# Patient Record
Sex: Male | Born: 1940
Health system: Southern US, Community
[De-identification: ages and names within clinical notes are randomized; demographics above are authoritative.]

## PROBLEM LIST (undated history)

## (undated) DIAGNOSIS — I5022 Chronic systolic (congestive) heart failure: Secondary | ICD-10-CM

## (undated) DIAGNOSIS — I2510 Atherosclerotic heart disease of native coronary artery without angina pectoris: Secondary | ICD-10-CM

## (undated) DIAGNOSIS — K219 Gastro-esophageal reflux disease without esophagitis: Secondary | ICD-10-CM

## (undated) DIAGNOSIS — I10 Essential (primary) hypertension: Secondary | ICD-10-CM

## (undated) DIAGNOSIS — E785 Hyperlipidemia, unspecified: Secondary | ICD-10-CM

## (undated) DIAGNOSIS — I420 Dilated cardiomyopathy: Secondary | ICD-10-CM

## (undated) DIAGNOSIS — Z950 Presence of cardiac pacemaker: Secondary | ICD-10-CM

## (undated) DIAGNOSIS — I2699 Other pulmonary embolism without acute cor pulmonale: Secondary | ICD-10-CM

## (undated) DIAGNOSIS — L03313 Cellulitis of chest wall: Secondary | ICD-10-CM

## (undated) DIAGNOSIS — I4729 Other ventricular tachycardia: Secondary | ICD-10-CM

## (undated) DIAGNOSIS — I639 Cerebral infarction, unspecified: Secondary | ICD-10-CM

## (undated) DIAGNOSIS — E538 Deficiency of other specified B group vitamins: Secondary | ICD-10-CM

## (undated) DIAGNOSIS — F329 Major depressive disorder, single episode, unspecified: Secondary | ICD-10-CM

## (undated) DIAGNOSIS — R040 Epistaxis: Secondary | ICD-10-CM

## (undated) DIAGNOSIS — I509 Heart failure, unspecified: Secondary | ICD-10-CM

## (undated) DIAGNOSIS — E039 Hypothyroidism, unspecified: Secondary | ICD-10-CM

## (undated) DIAGNOSIS — N183 Chronic kidney disease, stage 3 unspecified: Secondary | ICD-10-CM

## (undated) DIAGNOSIS — F32A Depression, unspecified: Secondary | ICD-10-CM

## (undated) DIAGNOSIS — I340 Nonrheumatic mitral (valve) insufficiency: Secondary | ICD-10-CM

## (undated) DIAGNOSIS — C61 Malignant neoplasm of prostate: Secondary | ICD-10-CM

## (undated) DIAGNOSIS — Z95810 Presence of automatic (implantable) cardiac defibrillator: Secondary | ICD-10-CM

## (undated) DIAGNOSIS — I739 Peripheral vascular disease, unspecified: Secondary | ICD-10-CM

## (undated) DIAGNOSIS — I480 Paroxysmal atrial fibrillation: Secondary | ICD-10-CM

## (undated) DIAGNOSIS — H409 Unspecified glaucoma: Secondary | ICD-10-CM

## (undated) DIAGNOSIS — R569 Unspecified convulsions: Secondary | ICD-10-CM

## (undated) DIAGNOSIS — I219 Acute myocardial infarction, unspecified: Secondary | ICD-10-CM

## (undated) HISTORY — PX: HERNIA REPAIR: SHX51

## (undated) HISTORY — PX: CORONARY ANGIOPLASTY: SHX604

## (undated) HISTORY — PX: BRAIN SURGERY: SHX531

## (undated) HISTORY — PX: CRANIOPLASTY: SUR330

## (undated) HISTORY — PX: OTHER SURGICAL HISTORY: SHX169

## (undated) HISTORY — PX: CARDIAC CATHETERIZATION: SHX172

## (undated) HISTORY — PX: INSERT / REPLACE / REMOVE PACEMAKER: SUR710

## (undated) HISTORY — PX: CARDIAC DEFIBRILLATOR PLACEMENT: SHX171

## (undated) HISTORY — PX: CORONARY ARTERY BYPASS GRAFT: SHX141

## (undated) HISTORY — PX: JOINT REPLACEMENT: SHX530

---
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
## 2007-03-29 HISTORY — PX: JOINT REPLACEMENT: SHX530

## 2008-01-21 ENCOUNTER — Ambulatory Visit: Payer: Self-pay | Admitting: General Practice

## 2008-02-04 ENCOUNTER — Inpatient Hospital Stay: Payer: Self-pay | Admitting: General Practice

## 2011-03-02 ENCOUNTER — Ambulatory Visit: Payer: Self-pay | Admitting: Cardiology

## 2011-04-21 ENCOUNTER — Observation Stay: Payer: Self-pay | Admitting: Cardiology

## 2011-04-21 LAB — CBC WITH DIFFERENTIAL/PLATELET
Basophil %: 0.5 %
Eosinophil %: 2.3 %
HGB: 14.7 g/dL (ref 13.0–18.0)
Lymphocyte %: 32.4 %
MCH: 34.1 pg — ABNORMAL HIGH (ref 26.0–34.0)
Monocyte #: 0.5 10*3/uL (ref 0.0–0.7)
Monocyte %: 9.6 %
Neutrophil %: 55.2 %
RBC: 4.31 10*6/uL — ABNORMAL LOW (ref 4.40–5.90)
RDW: 13.1 % (ref 11.5–14.5)
WBC: 5.2 10*3/uL (ref 3.8–10.6)

## 2011-04-21 LAB — BASIC METABOLIC PANEL
BUN: 17 mg/dL (ref 7–18)
Creatinine: 1.24 mg/dL (ref 0.60–1.30)
EGFR (Non-African Amer.): >60
Glucose: 90 mg/dL (ref 65–99)
Osmolality: 286 (ref 275–301)
Potassium: 4.6 mmol/L (ref 3.5–5.1)
Sodium: 143 mmol/L (ref 136–145)

## 2012-07-12 ENCOUNTER — Inpatient Hospital Stay: Payer: Self-pay

## 2012-07-12 ENCOUNTER — Other Ambulatory Visit: Payer: Self-pay

## 2012-07-12 LAB — URINALYSIS, COMPLETE
Bacteria: NONE SEEN
Bilirubin,UR: NEGATIVE
Blood: NEGATIVE
Ketone: NEGATIVE
Leukocyte Esterase: NEGATIVE
Nitrite: NEGATIVE
RBC,UR: 1 /HPF (ref 0–5)
Specific Gravity: 1.021 (ref 1.003–1.030)

## 2012-07-12 LAB — CK TOTAL AND CKMB (NOT AT ARMC)
CK, Total: 199 U/L (ref 35–232)
CK-MB: 1.6 ng/mL (ref 0.5–3.6)

## 2012-07-12 LAB — SEDIMENTATION RATE: Erythrocyte Sed Rate: 29 mm/hr — ABNORMAL HIGH (ref 0–20)

## 2012-07-12 LAB — PRO B NATRIURETIC PEPTIDE: B-Type Natriuretic Peptide: 362 pg/mL — ABNORMAL HIGH (ref 0–125)

## 2012-07-12 LAB — TROPONIN I: Troponin-I: <0.02 ng/mL

## 2012-07-13 LAB — CBC WITH DIFFERENTIAL/PLATELET
Basophil #: 0.1 10*3/uL (ref 0.0–0.1)
Eosinophil #: 0 10*3/uL (ref 0.0–0.7)
Lymphocyte #: 1.5 10*3/uL (ref 1.0–3.6)
MCH: 33.1 pg (ref 26.0–34.0)
MCHC: 34.5 g/dL (ref 32.0–36.0)
MCV: 96 fL (ref 80–100)
Monocyte #: 0.5 x10 3/mm (ref 0.2–1.0)
Monocyte %: 13.3 %
Neutrophil %: 49.1 %
Platelet: 144 10*3/uL — ABNORMAL LOW (ref 150–440)
RDW: 14.2 % (ref 11.5–14.5)
WBC: 4.1 10*3/uL (ref 3.8–10.6)

## 2012-07-13 LAB — BASIC METABOLIC PANEL
Anion Gap: 8 (ref 7–16)
Chloride: 103 mmol/L (ref 98–107)
Co2: 24 mmol/L (ref 21–32)
Creatinine: 1.72 mg/dL — ABNORMAL HIGH (ref 0.60–1.30)
EGFR (Non-African Amer.): 40 — ABNORMAL LOW
Potassium: 4 mmol/L (ref 3.5–5.1)

## 2012-07-13 LAB — TROPONIN I: Troponin-I: <0.02 ng/mL

## 2012-07-14 LAB — BASIC METABOLIC PANEL
Anion Gap: 5 — ABNORMAL LOW (ref 7–16)
BUN: 21 mg/dL — ABNORMAL HIGH (ref 7–18)
Calcium, Total: 8.5 mg/dL (ref 8.5–10.1)
Creatinine: 1.63 mg/dL — ABNORMAL HIGH (ref 0.60–1.30)
EGFR (African American): 49 — ABNORMAL LOW
EGFR (Non-African Amer.): 42 — ABNORMAL LOW
Glucose: 82 mg/dL (ref 65–99)
Osmolality: 276 (ref 275–301)
Potassium: 4.6 mmol/L (ref 3.5–5.1)
Sodium: 137 mmol/L (ref 136–145)

## 2012-07-18 LAB — CULTURE, BLOOD (SINGLE)

## 2014-07-18 NOTE — Consult Note (Signed)
PATIENT NAME:  Jerry English, Jerry English MR#:  [id number] DATE OF BIRTH:  October 15, 1940  DATE OF CONSULTATION:  07/13/2012  REFERRING PHYSICIAN:   Dr. Bridgette Habermann and Dr. Ola Spurr.   CONSULTING PHYSICIAN:  Corey Skains, MD  REASON FOR CONSULTATION:  Known cardiomyopathy, ejection fraction of 25% with ICD placement, coronary artery disease, chronic kidney disease, hypertension and cardiomyopathy with currently hypotension, weakness and fatigue. The patient was admitted to the hospital for severe weakness and fatigue and dizziness and presyncope. He did not have full syncope at the time. He is having shortness of breath with physical activity, although chest x-ray showed no evidence of pulmonary edema. The patient does have cardiomyopathy for which he has an ejection fraction of 25% with an ICD with recent ICD information showing no evidence of ventricular tachycardia or ventricular fibrillation.  The patient has had coronary artery disease with PCI and stent placement in the right coronary artery in 1999, but no evidence of chest pain recently. He does have some chronic kidney disease not associated with symptoms listed above. The patient's hypertension was fine on multiple medications, but it appears that with changes, he has had some hypotension with blood pressure in the 24O systolic range. Currently, he feels somewhat better with treatment with intravenous fluids. Blood pressure is up to 973 systolic.   REVIEW OF SYSTEMS:  Negative for vision change, ringing in the ears, hearing loss, cough, congestion, heartburn, nausea, vomiting, diarrhea, bloody stools, stomach pain, extremity weakness, cramping of the buttocks, known blood clots, headaches, blackouts, dizzy spells, nosebleed, congestion, trouble swallowing, frequent urination at night, muscle weakness, numbness, anxiety, depression, skin lesions or skin rashes.   PAST MEDICAL HISTORY: 1.  Cardiomyopathy.  2.  Hypertension.  3.  Hyperlipidemia.  4.   Chronic kidney disease.   FAMILY HISTORY: No family members with early onset of cardiovascular disease or hypertension.   SOCIAL HISTORY:  Currently denies alcohol or tobacco use.   ALLERGIES:  As listed.  MEDICATIONS:  As listed.  PHYSICAL EXAMINATION: VITAL SIGNS: Blood pressure 110/68 bilaterally, heart rate 72 upright, reclining and regular.  GENERAL:  He is a well-appearing male in no acute distress.  HEAD, EYES, EARS, NOSE, THROAT: No icterus, thyromegaly, ulcers, hemorrhages or xanthelasma.  CARDIOVASCULAR:  Regular rate and rhythm. Normal S1 and S2, 2/6 apical murmur consistent with mitral regurgitation. PMI is inferiorly displaced. Carotid upstroke normal without bruit. Jugular venous pressure is normal.  LUNGS:   Clear to auscultation with normal respirations.  ABDOMEN:  Soft, nontender without hepatosplenomegaly or masses. Abdominal aorta is normal size without bruit.  EXTREMITIES:  Show 2+ bilateral pulses in dorsal, pedal, radial and femoral arteries without lower extremity edema, cyanosis, clubbing or ulcers.  NEUROLOGIC:  He is oriented to time, place and person with normal mood and affect.   ASSESSMENT: A 74 year old male with coronary artery disease, hypertension, hyperlipidemia, chronic kidney disease, cardiomyopathy with hypotension likely secondary to poor cardiac output versus need of medication management changes without current evidence of myocardial infarction with an EKG showing arteriovenous sequential pacing.   RECOMMENDATIONS: 1.  Continue gentle hydration.  2.  Reinstate beta blocker if able at low dose prior to discharge to home for continued treatment of cardiomyopathy.  3.  Abstain from ACE inhibitor at this time and reinstate as able for renal dysfunction and chronic kidney disease, watching closely for worsening symptoms of chronic kidney disease.  4.  Continue aspirin for further risk reduction of coronary artery disease.  5.  No further cardiac  diagnosis  is necessary at this time due to no evidence of exacerbation of congestive heart failure or cardiomyopathy.  6.  Further treatment options after ambulation.    ____________________________ Corey Skains, MD bjk:dmm D: 07/13/2012 09:32:00 ET T: 07/13/2012 09:57:39 ET JOB#: [id number]  cc: Corey Skains, MD, <Dictator> Corey Skains MD ELECTRONICALLY SIGNED 07/18/2012 8:12

## 2014-07-18 NOTE — Discharge Summary (Signed)
PATIENT NAME:  Jerry English, Jerry English MR#:  [id number] DATE OF BIRTH:  10-Apr-1940  DATE OF ADMISSION:  07/12/2012 DATE OF DISCHARGE: 07/14/2012    PRIMARY CARE PROVIDER: Cheral Marker. Ola Spurr, MD  CONSULTANT: Corey Skains, MD  DISCHARGE DIAGNOSES:  1. Acute sinusitis.  2. Congestive heart failure.  3. Coronary artery disease  HISTORY OF PRESENT ILLNESS: A 74 year old male with a history of coronary disease, hypertension, hyperlipidemia and cardiomyopathy (EF 25% to 30% in October 2013), presented with complaints of fevers, cough, weakness and shortness of breath. Symptoms had been ongoing for 5 days. Temperature was found to be greater than 100 deg for two days prior to admission, and he was started on antibiotics and guaifenesin. Due to ongoing symptoms as well as nausea and poor p.o. intake, he presented for further evaluation. Initial blood pressure was found to be in the 90s to 284 range systolic, he was afebrile, with normal O2 sat. Initial white count 4.7, creatinine elevated at 1.8 (baseline 1.5), and cardiac enzymes negative. Chest x-ray showed no acute findings.   HOSPITAL COURSE: The patient was admitted and placed on telemetry. Due to concerns of dehydration, he was initiated on gentle IV fluids. Due to hypotension, losartan/HCTZ, spironolactone and amiodarone were held. Cardiology was consulted, and he was evaluated by Dr. Nehemiah Massed. Dr. Nehemiah Massed concurred with holding his blood pressure agents and recommended restarting amiodarone. Augmentin was felt to possibly be contributing to nausea, so that was stopped, and he was changed to Ceftin. The patient remained afebrile. He was monitored, and blood pressure mildly improved off BP agents. He had no evidence of CHF exacerbation. For concerns of sinusitis, loratadine and Flonase were added. Plan is for close followup with PCP and his cardiologist, Dr. Ubaldo Glassing, in several days, with consideration of restarting BP agents if needed.   DISCHARGE  MEDICATIONS:  1. Amiodarone 200 mg daily.  2. Simvastatin 40 mg daily.  3. Omeprazole 20 mg daily.  4. Aspirin 81 mg 2 tabs daily.  5. Travatan eyedrops 1 gtt both eyes daily.  6. Timolol 0.5% eyedrops 1 gtt both eyes daily.  7. Cefuroxime 500 mg b.i.d. for an additional 8 days.  8. Fluticasone 2 sprays daily.  9. Loratadine 10 mg daily.   MEDICATIONS HELD AT DISCHARGE:  1. Losartan/HCTZ.  2. Spironolactone.   ____________________________ A. Lavone Orn, MD ams:OSi D: 07/14/2012 11:24:40 ET T: 07/14/2012 13:37:33 ET JOB#: [id number]  cc: A. Lavone Orn, MD, <Dictator> Cheral Marker. Ola Spurr, MD Javier Docker Ubaldo Glassing, MD Sherlon Handing MD ELECTRONICALLY SIGNED 07/15/2012 8:22

## 2014-07-18 NOTE — H&P (Signed)
PATIENT NAME:  Jerry English, Jerry English MR#:  [id number] DATE OF BIRTH:  [birth year]  DATE OF ADMISSION:  07/12/2012  PRIMARY CARE PHYSICIAN:  Dr. Adrian Prows.    PRIMARY CARDIOLOGIST:  Dr. Ubaldo Glassing.  PULMONOLOGIST:  Dr. Raul Del.   REASON FOR ADMISSION:  Hypotension and fever, nausea, vomiting and dizziness.   HISTORY OF PRESENT ILLNESS:  This is a pleasant 74 year old gentleman relatively well-functioning who has a history of coronary artery disease followed by Dr. Ubaldo Glassing as well as hypertension, hyperlipidemia and an idiopathic cardiomyopathy.  He had been doing relatively well until approximately 5 days ago when he developed some fevers, cough, weakness, shortness of breath and chest pain and pressure.  He was also very dizzy.  He went to the urgent care center two days ago, was noted to have a temperature greater than 100, was given Augmentin and guaifenesin.  No blood work was done at that time.  He continues to feel quite poor with continued fevers.  He also started to have some nausea, vomiting, has very poor intake.  He had chills last night.  Of note, he has had no diarrhea.  He does report that his breathing is worse when he is lying supine.   It is also reported that he had had some breast swelling and tenderness since he has been on the spironolactone for the last year.   PAST MEDICAL HISTORY: 1.  Coronary artery disease, status post PTCA of the proximal right coronary artery in 1999.  2.  Idiopathic cardiomyopathy.  Most recent EF of 25% to 30% on 01/17/2012.  He also has mild pulmonary hypertension.  3.  ICD biventricular pacer placed February of 2013.  4.  Hypertension.  5.  Hyperlipidemia.   PAST SURGICAL HISTORY:  Knee surgery in 1973, biventricular AICD placed in 2013, head injury in 1981.   SOCIAL HISTORY:  The patient is married and here with his wife.  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  He does not smoke and does not drink.   FAMILY HISTORY:   Positive for a brother with prostate cancer.  There is also a history of cardiac disease with his father dying of an MI at age 45.   REVIEW OF SYSTEMS:  Eleven systems reviewed and negative except as per history of present illness.   CURRENT MEDICATIONS:  Currently as an outpatient the patient remains on:  1.  Aspirin 81 once a day.  2.  Simvastatin 40 once a day.  3.  Losartan hydrochlorothiazide 100/12.5 once a day.  4.  Omeprazole 20 mg once a day.  5.  Spironolactone 25 mg once a day.  6.  Amiodarone 200 mg once a day.  He has been on this for about a year.  7.  Lansoprazole eye drops.  8.  Timoptic eye drops.   ALLERGIES:  No known drug allergies.   PHYSICAL EXAMINATION: VITAL SIGNS:  Weight 222, blood pressure 98/78 to 108/60, heart rate 66, temperature 99, pulse ox 96% on room air.  GENERAL:  He is ill-appearing, has a mottled appearance to skin with very poor capillary refill.  He has some mild confusion.  HEENT:  His pupils are equal, round, reactive to light and accommodation.  NECK:  Supple.   OROPHARYNX:  Mucous membranes are slightly dry.  No lymphadenopathy.  HEART:  Has very distant heart sounds.  LUNGS:  Clear to auscultation bilaterally.  ABDOMEN:  Soft, nontender, nondistended.  EXTREMITIES:  There is trace edema.  He has very cool extremities with poor perfusion and mottled appearance.  There is very poor cap refill.  NEUROLOGIC:  He is somewhat sluggish and slow, but he is alert, interactive.  Cranial nerves II through XII are intact and his strength is 5 out of 5 bilateral upper extremity, lower extremity.   DIAGNOSTIC DATA:  Blood work done today, white count 4.7, hemoglobin 14.0, platelets 172.  Comprehensive panel within normal limits except a creatinine of 1.8, which is slightly elevated from his baseline of about 1.5.  Potassium is 5.2.  Sodium is a little low at 133.  CK is 199 with MB 1.6, and troponin of 0.02.  Chest x-ray interpreted by me showed poor  inspiratory view.  He has some mild cardiomegaly.  ICD in place.  There is some mild cephalization, but no focal infiltrate.  Maybe some haziness at the left heart border.   IMPRESSION:  A 74 year old with coronary artery disease, known idiopathic cardiomyopathy, ejection fraction of about 25% with an illness over the last several weeks with fevers, progressive weakness and dizziness, shortness of breath, now some nausea, vomiting.  He is hypotensive and quite mottled and cool extremities.  Concern is for other infectious sepsis or possibly cardiogenic shock as he has quite distant heart sounds as well.   PLAN: 1.  We will admit to telemetry.  2.  We will give gentle IV fluids.  3.  We will check BNP.  We will also check thyroid testing as he is on amiodarone.  Check an ESR and blood cultures as well.  4.  We are going to hold his antihypertensives including the losartan, hydrochlorothiazide and spironolactone for now.  I am also going to hold his amiodarone.  5.  We will check an echocardiogram.  6.   We will consult cardiology for further evaluation.  7.  He has recently received Augmentin and that may be contributing to his nausea, vomiting.  In the absence of an elevated white count I am going to hold on antibiotic therapy at this time.     ____________________________ Cheral Marker. Ola Spurr, MD dpf:ea D: 07/12/2012 17:31:49 ET T: 07/12/2012 18:22:41 ET JOB#: [id number]  cc: Cheral Marker. Ola Spurr, MD, <Dictator> Korban Shearer Ola Spurr MD ELECTRONICALLY SIGNED 07/25/2012 12:08

## 2014-08-09 ENCOUNTER — Other Ambulatory Visit: Payer: Self-pay

## 2014-08-09 ENCOUNTER — Encounter: Payer: Self-pay | Admitting: Emergency Medicine

## 2014-08-09 ENCOUNTER — Emergency Department
Admission: EM | Admit: 2014-08-09 | Discharge: 2014-08-09 | Disposition: A | Discharge status: Home / Self Care | Payer: Medicare Other | Attending: Emergency Medicine | Admitting: Emergency Medicine

## 2014-08-09 ENCOUNTER — Emergency Department: Payer: Medicare Other

## 2014-08-09 DIAGNOSIS — Z7982 Long term (current) use of aspirin: Secondary | ICD-10-CM | POA: Diagnosis not present

## 2014-08-09 DIAGNOSIS — S0990XA Unspecified injury of head, initial encounter: Secondary | ICD-10-CM | POA: Diagnosis not present

## 2014-08-09 DIAGNOSIS — Z79899 Other long term (current) drug therapy: Secondary | ICD-10-CM | POA: Insufficient documentation

## 2014-08-09 DIAGNOSIS — Y9389 Activity, other specified: Secondary | ICD-10-CM | POA: Insufficient documentation

## 2014-08-09 DIAGNOSIS — Y9289 Other specified places as the place of occurrence of the external cause: Secondary | ICD-10-CM | POA: Diagnosis not present

## 2014-08-09 DIAGNOSIS — Y998 Other external cause status: Secondary | ICD-10-CM | POA: Diagnosis not present

## 2014-08-09 DIAGNOSIS — S20211A Contusion of right front wall of thorax, initial encounter: Secondary | ICD-10-CM | POA: Diagnosis not present

## 2014-08-09 DIAGNOSIS — W01198A Fall on same level from slipping, tripping and stumbling with subsequent striking against other object, initial encounter: Secondary | ICD-10-CM | POA: Diagnosis not present

## 2014-08-09 DIAGNOSIS — I10 Essential (primary) hypertension: Secondary | ICD-10-CM | POA: Diagnosis not present

## 2014-08-09 DIAGNOSIS — S8991XA Unspecified injury of right lower leg, initial encounter: Secondary | ICD-10-CM | POA: Insufficient documentation

## 2014-08-09 HISTORY — DX: Essential (primary) hypertension: I10

## 2014-08-09 HISTORY — DX: Presence of automatic (implantable) cardiac defibrillator: Z95.810

## 2014-08-09 HISTORY — DX: Unspecified glaucoma: H40.9

## 2014-08-09 MED ORDER — HYDROCODONE-ACETAMINOPHEN 5-325 MG PO TABS
1.0000 | ORAL_TABLET | Freq: Four times a day (QID) | ORAL | Status: DC | PRN
Start: 2014-08-09 — End: 2015-02-26

## 2014-08-09 MED ORDER — MORPHINE SULFATE 4 MG/ML IJ SOLN
INTRAMUSCULAR | Status: AC
  Administered 2014-08-09: 4 mg via INTRAVENOUS
  Filled 2014-08-09: qty 1

## 2014-08-09 MED ORDER — MORPHINE SULFATE 4 MG/ML IJ SOLN
4.0000 mg | Freq: Once | INTRAMUSCULAR | Status: AC
  Administered 2014-08-09: 4 mg via INTRAVENOUS

## 2014-08-09 MED ORDER — MORPHINE SULFATE 4 MG/ML IJ SOLN
INTRAMUSCULAR | Status: AC
  Filled 2014-08-09: qty 1

## 2014-08-09 NOTE — Discharge Instructions (Signed)
Chest Contusion A chest contusion is a deep bruise on your chest area. Contusions are the result of an injury that caused bleeding under the skin. A chest contusion may involve bruising of the skin, muscles, or ribs. The contusion may turn blue, purple, or yellow. Minor injuries will give you a painless contusion, but more severe contusions may stay painful and swollen for a few weeks. CAUSES  A contusion is usually caused by a blow, trauma, or direct force to an area of the body. SYMPTOMS   Swelling and redness of the injured area.  Discoloration of the injured area.  Tenderness and soreness of the injured area.  Pain. DIAGNOSIS  The diagnosis can be made by taking a history and performing a physical exam. An X-ray, CT scan, or MRI may be needed to determine if there were any associated injuries, such as broken bones (fractures) or internal injuries. TREATMENT  Often, the best treatment for a chest contusion is resting, icing, and applying cold compresses to the injured area. Deep breathing exercises may be recommended to reduce the risk of pneumonia. Over-the-counter medicines may also be recommended for pain control. HOME CARE INSTRUCTIONS   Put ice on the injured area.  Put ice in a plastic bag.  Place a towel between your skin and the bag.  Leave the ice on for 15-20 minutes, 03-04 times a day.  Only take over-the-counter or prescription medicines as directed by your caregiver. Your caregiver may recommend avoiding anti-inflammatory medicines (aspirin, ibuprofen, and naproxen) for 48 hours because these medicines may increase bruising.  Rest the injured area.  Perform deep-breathing exercises as directed by your caregiver.  Stop smoking if you smoke.  Do not lift objects over 5 pounds (2.3 kg) for 3 days or longer if recommended by your caregiver. SEEK IMMEDIATE MEDICAL CARE IF:   You have increased bruising or swelling.  You have pain that is getting worse.  You have  difficulty breathing.  You have dizziness, weakness, or fainting.  You have blood in your urine or stool.  You cough up or vomit blood.  Your swelling or pain is not relieved with medicines. MAKE SURE YOU:   Understand these instructions.  Will watch your condition.  Will get help right away if you are not doing well or get worse. Document Released: 12/07/2000 Document Revised: 12/07/2011 Document Reviewed: 09/05/2011 [facility] Patient Information 2015 [place], [place]. This information is not intended to replace advice given to you by your health care provider. Make sure you discuss any questions you have with your health care provider.   Head Injury You have a head injury. Headaches and throwing up (vomiting) are common after a head injury. It should be easy to wake up from sleeping. Sometimes you must stay in the hospital. Most problems happen within the first 24 hours. Side effects may occur up to 7-10 days after the injury.  WHAT ARE THE TYPES OF HEAD INJURIES? Head injuries can be as minor as a bump. Some head injuries can be more severe. More severe head injuries include:  A jarring injury to the brain (concussion).  A bruise of the brain (contusion). This mean there is bleeding in the brain that can cause swelling.  A cracked skull (skull fracture).  Bleeding in the brain that collects, clots, and forms a bump (hematoma). WHEN SHOULD I GET HELP RIGHT AWAY?   You are confused or sleepy.  You cannot be woken up.  You feel sick to your stomach (nauseous) or keep  throwing up (vomiting).  Your dizziness or unsteadiness is getting worse.  You have very bad, lasting headaches that are not helped by medicine. Take medicines only as told by your doctor.  You cannot use your arms or legs like normal.  You cannot walk.  You notice changes in the black spots in the center of the colored part of your eye (pupil).  You have clear or bloody fluid coming from your nose or  ears.  You have trouble seeing. During the next 24 hours after the injury, you must stay with someone who can watch you. This person should get help right away (call 911 in the [place] if you start to shake and are not able to control it (have seizures), you pass out, or you are unable to wake up. HOW CAN I PREVENT A HEAD INJURY IN THE FUTURE?  Wear seat belts.  Wear a helmet while bike riding and playing sports like football.  Stay away from dangerous activities around the house. WHEN CAN I RETURN TO NORMAL ACTIVITIES AND ATHLETICS? See your doctor before doing these activities. You should not do normal activities or play contact sports until 1 week after the following symptoms have stopped:  Headache that does not go away.  Dizziness.  Poor attention.  Confusion.  Memory problems.  Sickness to your stomach or throwing up.  Tiredness.  Fussiness.  Bothered by bright lights or loud noises.  Anxiousness or depression.  Restless sleep. MAKE SURE YOU:   Understand these instructions.  Will watch your condition.  Will get help right away if you are not doing well or get worse. Document Released: 02/25/2008 Document Revised: 07/29/2013 Document Reviewed: 11/19/2012 [facility] Patient Information 2015 [place], [place]. This information is not intended to replace advice given to you by your health care provider. Make sure you discuss any questions you have with your health care provider.

## 2014-08-09 NOTE — ED Notes (Signed)
C-collar removed after dr. Jacqualine Code reviewed CT results per dr. Jacqualine Code verbal order.

## 2014-08-09 NOTE — ED Provider Notes (Signed)
----------------------------------------- 6:38 PM on 08/09/2014 -----------------------------------------  [facility] Emergency Department Provider Note  ____________________________________________  Time seen: Approximately 6:39 PM  I have reviewed the triage vital signs and the nursing notes.   HISTORY  Chief Complaint Fall; Head Injury; Rib Injury; and Knee Pain    HPI Jerry English is a 74 y.o. male presents after falling. The patient stumbled while getting off of a [employer] trailer because it was slippery, he then was trying to catch his balance and fell forward into a parked trailer striking the top of his head, right knee knee, and right ribs.He feels his knee is just "scraped up" and denies problems walking with good or severe pain. However he does note that he has pain with taking deep breaths on the right side, in addition he is been having a moderate bilateral headache and feeling slight blurring of his vision since the time of the fall.  The patient fell about 3:30 while moving today in [place]. Defines pain as sharp in the chest and pounding in the head. Severity is moderate to severe.  Patient does take aspirin. No other anticoagulants or Plavix. He feels slightly short of breath when he tries to take a deep breath because of pain, but denies trouble breathing while at rest. He denies wheezing, cough, fever.  He says the back of his neck is achy and sore. He denies any numbness or tingling. There is no weakness. No alcohol or drug use. Was no loss of consciousness.  Last tetanus shot about 2 years ago   Past Medical History  Diagnosis Date  . Hypertension   . Glaucoma   . Presence of combination internal cardiac defibrillator (ICD) and pacemaker     There are no active problems to display for this patient.   History reviewed. No pertinent past surgical history.  Current Outpatient Rx  Name  Route  Sig  Dispense  Refill  . amiodarone  (PACERONE) 200 MG tablet   Oral   Take 200 mg by mouth every morning.         Marland Kitchen aspirin EC 81 MG tablet   Oral   Take 162 mg by mouth every morning.         . dorzolamide (TRUSOPT) 2 % ophthalmic solution   Both Eyes   Place into both eyes 2 (two) times daily.         . furosemide (LASIX) 20 MG tablet   Oral   Take 20 mg by mouth every morning.         . latanoprost (XALATAN) 0.005 % ophthalmic solution   Both Eyes   Place 1 drop into both eyes at bedtime.         Marland Kitchen losartan (COZAAR) 50 MG tablet   Oral   Take 50 mg by mouth every morning.         Marland Kitchen omeprazole (PRILOSEC OTC) 20 MG tablet   Oral   Take 20 mg by mouth every morning.         . simvastatin (ZOCOR) 40 MG tablet   Oral   Take 40 mg by mouth every morning.         . timolol (TIMOPTIC) 0.5 % ophthalmic solution   Both Eyes   Place 1 drop into both eyes 2 (two) times daily.         Marland Kitchen HYDROcodone-acetaminophen (NORCO/VICODIN) 5-325 MG per tablet   Oral   Take 1 tablet by mouth every 6 (six) hours  as needed for moderate pain.   15 tablet   0     Allergies Review of patient's allergies indicates no known allergies.  History reviewed. No pertinent family history.  Social History History  Substance Use Topics  . Smoking status: Never Smoker   . Smokeless tobacco: Never Used  . Alcohol Use: No    Review of Systems Constitutional: No fever/chills Eyes: No visual changes. ENT: No sore throat. Cardiovascular: See history of present illness Respiratory: See history of present illness Gastrointestinal: No abdominal pain.  No nausea, no vomiting.  No diarrhea.  No constipation. Genitourinary: Negative for dysuria. Musculoskeletal: Negative for back pain. Skin: Negative for rash. Neurological: Negative for headaches, focal weakness or numbness.  10-point ROS otherwise negative.  ____________________________________________   PHYSICAL EXAM:  VITAL SIGNS: ED Triage Vitals  Enc  Vitals Group     BP 08/09/14 1802 139/87 mmHg     Pulse Rate 08/09/14 1802 60     Resp 08/09/14 1802 20     Temp 08/09/14 1802 97.9 F (36.6 C)     Temp Source 08/09/14 1802 Oral     SpO2 08/09/14 1802 98 %     Weight 08/09/14 1802 228 lb (103.42 kg)     Height 08/09/14 1802 6\' 1"  (1.854 m)     Head Cir --      Peak Flow --      Pain Score 08/09/14 1803 7     Pain Loc --      Pain Edu? --      Excl. in [place]? --     Constitutional: Alert and oriented. Well appearing and in no acute distress. Eyes: Conjunctivae are normal. PERRL. EOMI. Head: Atraumatic except for a small abrasion on the top of the scalp just left of midline. There is no obvious hematoma or swelling. Nose: No congestion/rhinnorhea. Mouth/Throat: Mucous membranes are moist.  Oropharynx non-erythematous. Neck: No stridor.  The entire posterior of the upper cervical spine and neck feels tender, there does not appear to be any focal midline tenderness, but we have placed the patient is c-collar. Cardiovascular: Normal rate, regular rhythm. Grossly normal heart sounds.  Good peripheral circulation. Respiratory: Normal respiratory effort.  No retractions. Lungs CTAB. Patient does have tenderness without bruising or crepitance noted over the right anterior mid chest. Gastrointestinal: Soft and nontender. No distention. No abdominal bruits. No CVA tenderness. Musculoskeletal: No lower extremity tenderness nor edema.  No joint effusions. Neurologic:  Normal speech and language. No gross focal neurologic deficits are appreciated. Speech is normal. No gait instability. Skin:  Skin is warm, dry and intact. No rash noted. Psychiatric: Mood and affect are normal. Speech and behavior are normal.  ____________________________________________   LABS (all labs ordered are listed, but only abnormal results are displayed)  Labs Reviewed - No data to display ____________________________________________  EKG  ECG rate 61. AV  dual-chamber pacemaker. No acute ST abnormalities to suggest ischemia, expected left bundle-branch block appearance. QRS 162 gtt. C5 72, however this does include a relatively wide QRS of 162. ____________________________________________  RADIOLOGY  . He had, cervical spine, chest x-ray show no evidence of acute trauma. Patient feels improved after morphine. He is saturating well, no distress ____________________________________________   PROCEDURES  Procedure(s) performed: None  Critical Care performed: No  ____________________________________________   INITIAL IMPRESSION / ASSESSMENT AND PLAN / ED COURSE  Pertinent labs & imaging results that were available during my care of the patient were reviewed by me and considered in  my medical decision making (see chart for details).  Traumatic injury. No loss of consciousness. Pleuritic pain over the right chest. Primary concern would be evaluation for rib fractures and/or pneumothorax. The mechanism does not fit that of aortic injury. There is no ACS symptoms. The fall was mechanical. We will obtain CT head and cervical spine, chest x-ray and EKG.  Treat pain with morphine. Reevaluate.  ----------------------------------------- 8:19 PM on 08/09/2014 -----------------------------------------  Reports he feels improved. Vital signs are stable. Reviewed his head CT with them.  No evidence of acute injury on radiology.  Discussed with the patient had injury return precautions as well as return precautions for any increased chest pain, trouble breathing, weakness, fever, nausea, vomiting, weakness, numbness, or other new concerns arise.  Patient's wife will drive him home.  Patient agrees not to drive while taking again. He has presented taken Vicodin without problems, he did report developing confusion once while taking Percocet so we will give him Vicodin.   ____________________________________________   FINAL CLINICAL  IMPRESSION(S) / ED DIAGNOSES  Final diagnoses:  Closed head injury, initial encounter  Chest wall contusion, right, initial encounter      Delman Kitten, MD 08/09/14 2337

## 2014-08-09 NOTE — ED Notes (Signed)
Patient c/o intermittent  Blurred vision, bilateral temporal headaches, right side rib pain, right knee, and left hand. Neuro check is WNL. GCS 15

## 2014-08-09 NOTE — ED Notes (Signed)
Patient to ED after fall off the back of a truck and on to a trailer in [place]. Patient reports this happened about 3:30 this afternoon. Patient c/o pain and abrasion to top of head right knee and pain to right ribs making it hard for him to take a deep breath.

## 2014-10-02 ENCOUNTER — Encounter
Admission: RE | Disposition: A | Discharge status: Home / Self Care | Payer: Self-pay | Source: Ambulatory Visit | Attending: Gastroenterology

## 2014-10-02 ENCOUNTER — Ambulatory Visit: Payer: Medicare Other | Admitting: Anesthesiology

## 2014-10-02 ENCOUNTER — Ambulatory Visit
Admission: RE | Admit: 2014-10-02 | Discharge: 2014-10-02 | Disposition: A | Discharge status: Home / Self Care | Payer: Medicare Other | Source: Ambulatory Visit | Attending: Gastroenterology | Admitting: Gastroenterology

## 2014-10-02 DIAGNOSIS — N400 Enlarged prostate without lower urinary tract symptoms: Secondary | ICD-10-CM | POA: Insufficient documentation

## 2014-10-02 DIAGNOSIS — D120 Benign neoplasm of cecum: Secondary | ICD-10-CM | POA: Diagnosis not present

## 2014-10-02 DIAGNOSIS — Z7982 Long term (current) use of aspirin: Secondary | ICD-10-CM | POA: Diagnosis not present

## 2014-10-02 DIAGNOSIS — R1310 Dysphagia, unspecified: Secondary | ICD-10-CM | POA: Diagnosis not present

## 2014-10-02 DIAGNOSIS — D123 Benign neoplasm of transverse colon: Secondary | ICD-10-CM | POA: Diagnosis not present

## 2014-10-02 DIAGNOSIS — I10 Essential (primary) hypertension: Secondary | ICD-10-CM | POA: Insufficient documentation

## 2014-10-02 DIAGNOSIS — Z951 Presence of aortocoronary bypass graft: Secondary | ICD-10-CM | POA: Insufficient documentation

## 2014-10-02 DIAGNOSIS — I4891 Unspecified atrial fibrillation: Secondary | ICD-10-CM | POA: Diagnosis not present

## 2014-10-02 DIAGNOSIS — K222 Esophageal obstruction: Secondary | ICD-10-CM | POA: Insufficient documentation

## 2014-10-02 DIAGNOSIS — Z87898 Personal history of other specified conditions: Secondary | ICD-10-CM | POA: Diagnosis not present

## 2014-10-02 DIAGNOSIS — D122 Benign neoplasm of ascending colon: Secondary | ICD-10-CM | POA: Insufficient documentation

## 2014-10-02 DIAGNOSIS — Z79899 Other long term (current) drug therapy: Secondary | ICD-10-CM | POA: Insufficient documentation

## 2014-10-02 DIAGNOSIS — Z950 Presence of cardiac pacemaker: Secondary | ICD-10-CM | POA: Diagnosis not present

## 2014-10-02 DIAGNOSIS — F329 Major depressive disorder, single episode, unspecified: Secondary | ICD-10-CM | POA: Diagnosis not present

## 2014-10-02 DIAGNOSIS — I493 Ventricular premature depolarization: Secondary | ICD-10-CM | POA: Insufficient documentation

## 2014-10-02 DIAGNOSIS — Z820 Family history of epilepsy and other diseases of the nervous system: Secondary | ICD-10-CM | POA: Diagnosis not present

## 2014-10-02 DIAGNOSIS — I509 Heart failure, unspecified: Secondary | ICD-10-CM | POA: Diagnosis not present

## 2014-10-02 DIAGNOSIS — K219 Gastro-esophageal reflux disease without esophagitis: Secondary | ICD-10-CM | POA: Insufficient documentation

## 2014-10-02 DIAGNOSIS — Z8249 Family history of ischemic heart disease and other diseases of the circulatory system: Secondary | ICD-10-CM | POA: Insufficient documentation

## 2014-10-02 DIAGNOSIS — G40909 Epilepsy, unspecified, not intractable, without status epilepticus: Secondary | ICD-10-CM | POA: Diagnosis not present

## 2014-10-02 DIAGNOSIS — K5730 Diverticulosis of large intestine without perforation or abscess without bleeding: Secondary | ICD-10-CM | POA: Diagnosis not present

## 2014-10-02 DIAGNOSIS — Z8042 Family history of malignant neoplasm of prostate: Secondary | ICD-10-CM | POA: Insufficient documentation

## 2014-10-02 DIAGNOSIS — Z8673 Personal history of transient ischemic attack (TIA), and cerebral infarction without residual deficits: Secondary | ICD-10-CM | POA: Insufficient documentation

## 2014-10-02 DIAGNOSIS — K625 Hemorrhage of anus and rectum: Secondary | ICD-10-CM | POA: Diagnosis present

## 2014-10-02 HISTORY — DX: Unspecified convulsions: R56.9

## 2014-10-02 HISTORY — DX: Acute myocardial infarction, unspecified: I21.9

## 2014-10-02 HISTORY — PX: ESOPHAGOGASTRODUODENOSCOPY (EGD) WITH PROPOFOL: SHX5813

## 2014-10-02 HISTORY — DX: Presence of cardiac pacemaker: Z95.0

## 2014-10-02 HISTORY — PX: COLONOSCOPY WITH PROPOFOL: SHX5780

## 2014-10-02 SURGERY — ESOPHAGOGASTRODUODENOSCOPY (EGD) WITH PROPOFOL
Anesthesia: General

## 2014-10-02 MED ORDER — SODIUM CHLORIDE 0.9 % IV SOLN: INTRAVENOUS | Status: DC

## 2014-10-02 MED ORDER — PROPOFOL INFUSION 10 MG/ML OPTIME
INTRAVENOUS | Status: DC | PRN
Start: 2014-10-02 — End: 2014-10-02
  Administered 2014-10-02: 120 ug/kg/min via INTRAVENOUS

## 2014-10-02 MED ORDER — SODIUM CHLORIDE 0.9 % IV SOLN
INTRAVENOUS | Status: DC | PRN
Start: 2014-10-02 — End: 2014-10-02
  Administered 2014-10-02: 12:00:00 via INTRAVENOUS

## 2014-10-02 MED ORDER — LIDOCAINE HCL (CARDIAC) 20 MG/ML IV SOLN
INTRAVENOUS | Status: DC | PRN
  Administered 2014-10-02: 60 mg via INTRAVENOUS

## 2014-10-02 MED ORDER — PROPOFOL 10 MG/ML IV BOLUS
INTRAVENOUS | Status: DC | PRN
  Administered 2014-10-02: 40 mg via INTRAVENOUS

## 2014-10-02 MED ORDER — SODIUM CHLORIDE 0.9 % IV SOLN
INTRAVENOUS | Status: DC
  Administered 2014-10-02: 1000 mL via INTRAVENOUS

## 2014-10-02 MED ORDER — MIDAZOLAM HCL 2 MG/2ML IJ SOLN
INTRAMUSCULAR | Status: DC | PRN
  Administered 2014-10-02: 1 mg via INTRAVENOUS

## 2014-10-02 NOTE — Transfer of Care (Signed)
Immediate Anesthesia Transfer of Care Note  Patient: Jerry English  Procedure(s) Performed: Procedure(s): ESOPHAGOGASTRODUODENOSCOPY (EGD) WITH PROPOFOL (N/A) COLONOSCOPY WITH PROPOFOL (N/A)  Patient Location: Endoscopy Unit  Anesthesia Type:General  Level of Consciousness: awake, alert , oriented and patient cooperative  Airway & Oxygen Therapy: Patient Spontanous Breathing and Patient connected to nasal cannula oxygen  Post-op Assessment: Report given to RN, Post -op Vital signs reviewed and stable and Patient moving all extremities X 4  Post vital signs: Reviewed and stable  Last Vitals:  Filed Vitals:   10/02/14 1227  BP: 96/76  Pulse: 69  Temp: 35.7 C  Resp:     Complications: No apparent anesthesia complications

## 2014-10-02 NOTE — Op Note (Signed)
[facility] Patient Name: Jerry English Procedure Date: 10/02/2014 11:58 AM MRN: [id number] Account #: [id number] Date of Birth: 05-27-1940 Admit Type: Outpatient Age: 74 Room: [facility] ENDO ROOM 4 Gender: Male Note Status: Finalized Procedure:         Upper GI endoscopy Indications:       Dysphagia Providers:         Lupita Dawn. Candace Cruise, MD Referring MD:      Youlanda Roys. Ola Spurr, MD (Referring MD) Medicines:         Monitored Anesthesia Care Complications:     No immediate complications. Procedure:         Pre-Anesthesia Assessment:                    - Prior to the procedure, a History and Physical was                     performed, and patient medications, allergies and                     sensitivities were reviewed. The patient's tolerance of                     previous anesthesia was reviewed.                    - The risks and benefits of the procedure and the sedation                     options and risks were discussed with the patient. All                     questions were answered and informed consent was obtained.                    - After reviewing the risks and benefits, the patient was                     deemed in satisfactory condition to undergo the procedure.                    After obtaining informed consent, the endoscope was passed                     under direct vision. Throughout the procedure, the                     patient's blood pressure, pulse, and oxygen saturations                     were monitored continuously. The Olympus GIF-160 endoscope                     (S#. [id number]) was introduced through the mouth, and                     advanced to the second part of duodenum. The upper GI                     endoscopy was accomplished without difficulty. The patient                     tolerated the procedure well. Findings:      A benign-appearing, intrinsic mild stenosis was found at the  gastroesophageal junction. The  scope was withdrawn. Dilation was       performed with a Maloney dilator with mild resistance at 95 Fr.      The exam was otherwise without abnormality.      The entire examined stomach was normal.      The examined duodenum was normal. Impression:        - Benign-appearing esophageal stricture. Dilated.                    - The examination was otherwise normal.                    - Normal stomach.                    - Normal examined duodenum.                    - No specimens collected. Recommendation:    - Discharge patient to home.                    - Observe patient's clinical course.                    - The findings and recommendations were discussed with the                     patient. Procedure Code(s): --- Professional ---                    [phone], Esophagogastroduodenoscopy, flexible, transoral;                     diagnostic, including collection of specimen(s) by                     brushing or washing, when performed (separate procedure)                    43450, Dilation of esophagus, by unguided sound or bougie,                     single or multiple passes Diagnosis Code(s): --- Professional ---                    K22.2, Esophageal obstruction                    R13.10, Dysphagia, unspecified CPT copyright 2014 American Medical Association. All rights reserved. The codes documented in this report are preliminary and upon coder review may  be revised to meet current compliance requirements. Hulen Luster, MD 10/02/2014 12:04:41 PM This report has been signed electronically. Number of Addenda: 0 Note Initiated On: 10/02/2014 11:58 AM      [facility]

## 2014-10-02 NOTE — H&P (Signed)
  Date of Initial H&P: 09/17/2014  History reviewed, patient examined, no change in status, stable for surgery.

## 2014-10-02 NOTE — Anesthesia Postprocedure Evaluation (Signed)
  Anesthesia Post-op Note  Patient: Jerry English  Procedure(s) Performed: Procedure(s): ESOPHAGOGASTRODUODENOSCOPY (EGD) WITH PROPOFOL (N/A) COLONOSCOPY WITH PROPOFOL (N/A)  Anesthesia type:General  Patient location: PACU  Post pain: Pain level controlled  Post assessment: Post-op Vital signs reviewed, Patient's Cardiovascular Status Stable, Respiratory Function Stable, Patent Airway and No signs of Nausea or vomiting  Post vital signs: Reviewed and stable  Last Vitals:  Filed Vitals:   10/02/14 1300  BP: 111/75  Pulse: 58  Temp:   Resp: 19    Level of consciousness: awake, alert  and patient cooperative  Complications: No apparent anesthesia complications

## 2014-10-02 NOTE — Op Note (Signed)
[facility] Patient Name: Cleotha Tsang Procedure Date: 10/02/2014 11:57 AM MRN: [id number] Account #: [id number] Date of Birth: December 13, 1940 Admit Type: Outpatient Age: 74 Room: [facility] ENDO ROOM 4 Gender: Male Note Status: Finalized Procedure:         Colonoscopy Indications:       Rectal bleeding Providers:         Lupita Dawn. Candace Cruise, MD Referring MD:      Youlanda Roys. Ola Spurr, MD (Referring MD) Medicines:         Monitored Anesthesia Care Complications:     No immediate complications. Procedure:         Pre-Anesthesia Assessment:                    - Prior to the procedure, a History and Physical was                     performed, and patient medications, allergies and                     sensitivities were reviewed. The patient's tolerance of                     previous anesthesia was reviewed.                    - The risks and benefits of the procedure and the sedation                     options and risks were discussed with the patient. All                     questions were answered and informed consent was obtained.                    - After reviewing the risks and benefits, the patient was                     deemed in satisfactory condition to undergo the procedure.                    After obtaining informed consent, the colonoscope was                     passed under direct vision. Throughout the procedure, the                     patient's blood pressure, pulse, and oxygen saturations                     were monitored continuously. The Olympus CF-H180AL                     colonoscope ( S#: [id number] ) was introduced through the                     anus and advanced to the the cecum, identified by                     appendiceal orifice and ileocecal valve. The colonoscopy                     was performed without difficulty. The patient tolerated  the procedure well. The quality of the bowel preparation   was good. Findings:      Multiple small and large-mouthed diverticula were found in the sigmoid       colon and in the descending colon.      Two sessile polyps were found in the cecum. The polyps were small in       size. These polyps were removed with a hot snare. Resection and       retrieval were complete.      Two sessile polyps were found in the ascending colon. The polyps were       small in size. These polyps were removed with a hot snare. Resection and       retrieval were complete.      Three sessile polyps were found in the transverse colon. The polyps were       small in size. These polyps were removed with a hot snare. Resection and       retrieval were complete.      The exam was otherwise without abnormality. Impression:        - Diverticulosis in the sigmoid colon and in the                     descending colon.                    - Two small polyps in the cecum. Resected and retrieved.                    - Two small polyps in the ascending colon. Resected and                     retrieved.                    - Three small polyps in the transverse colon. Resected and                     retrieved.                    - The examination was otherwise normal. Recommendation:    - Discharge patient to home.                    - Await pathology results.                    - Repeat colonoscopy in 3 years for surveillance based on                     pathology results.                    - The findings and recommendations were discussed with the                     patient. Procedure Code(s): --- Professional ---                    [phone], Colonoscopy, flexible; with removal of tumor(s),                     polyp(s), or other lesion(s) by snare technique Diagnosis Code(s): --- Professional ---                    D12.0, Benign neoplasm of cecum  D12.2, Benign neoplasm of ascending colon                    D12.3, Benign neoplasm of transverse colon                     K62.5, Hemorrhage of anus and rectum                    K57.30, Diverticulosis of large intestine without                     perforation or abscess without bleeding CPT copyright 2014 American Medical Association. All rights reserved. The codes documented in this report are preliminary and upon coder review may  be revised to meet current compliance requirements. Hulen Luster, MD 10/02/2014 12:25:40 PM This report has been signed electronically. Number of Addenda: 0 Note Initiated On: 10/02/2014 11:57 AM Scope Withdrawal Time: 0 hours 13 minutes 13 seconds  Total Procedure Duration: 0 hours 16 minutes 39 seconds       [facility]

## 2014-10-02 NOTE — Anesthesia Preprocedure Evaluation (Signed)
Anesthesia Evaluation  Patient identified by MRN, date of birth, ID band Patient awake    Reviewed: Allergy & Precautions, H&P , NPO status , Patient's Chart, lab work & pertinent test results, reviewed documented beta blocker date and time   Airway Mallampati: II  TM Distance: >3 FB Neck ROM: full    Dental  (+) Chipped   Pulmonary neg pulmonary ROS,  breath sounds clear to auscultation  Pulmonary exam normal       Cardiovascular Exercise Tolerance: Good hypertension, + CAD and + Past MI Normal cardiovascular exam+ pacemaker + Cardiac Defibrillator Rhythm:regular Rate:Normal     Neuro/Psych Seizures -, Well Controlled,  negative psych ROS   GI/Hepatic negative GI ROS, Neg liver ROS,   Endo/Other  negative endocrine ROS  Renal/GU negative Renal ROS  negative genitourinary   Musculoskeletal   Abdominal   Peds  Hematology negative hematology ROS (+)   Anesthesia Other Findings   Reproductive/Obstetrics negative OB ROS                             Anesthesia Physical Anesthesia Plan  ASA: III  Anesthesia Plan: General   Post-op Pain Management:    Induction:   Airway Management Planned:   Additional Equipment:   Intra-op Plan:   Post-operative Plan:   Informed Consent: I have reviewed the patients History and Physical, chart, labs and discussed the procedure including the risks, benefits and alternatives for the proposed anesthesia with the patient or authorized representative who has indicated his/her understanding and acceptance.   Dental Advisory Given  Plan Discussed with: Anesthesiologist, CRNA and Surgeon  Anesthesia Plan Comments:         Anesthesia Quick Evaluation

## 2014-10-03 ENCOUNTER — Encounter: Payer: Self-pay | Admitting: Gastroenterology

## 2014-10-03 LAB — SURGICAL PATHOLOGY

## 2014-12-22 ENCOUNTER — Other Ambulatory Visit: Payer: Self-pay | Admitting: Otolaryngology

## 2014-12-22 DIAGNOSIS — R490 Dysphonia: Secondary | ICD-10-CM

## 2014-12-22 DIAGNOSIS — R1310 Dysphagia, unspecified: Secondary | ICD-10-CM

## 2014-12-30 ENCOUNTER — Ambulatory Visit
Admission: RE | Admit: 2014-12-30 | Discharge: 2014-12-30 | Disposition: A | Discharge status: Home / Self Care | Payer: Medicare Other | Source: Ambulatory Visit | Attending: Otolaryngology | Admitting: Otolaryngology

## 2014-12-30 ENCOUNTER — Encounter: Payer: Self-pay | Admitting: Speech Pathology

## 2014-12-30 ENCOUNTER — Ambulatory Visit: Payer: Medicare Other | Attending: Otolaryngology | Admitting: Speech Pathology

## 2014-12-30 DIAGNOSIS — R1310 Dysphagia, unspecified: Secondary | ICD-10-CM

## 2014-12-30 DIAGNOSIS — R490 Dysphonia: Secondary | ICD-10-CM | POA: Insufficient documentation

## 2014-12-30 NOTE — Therapy (Addendum)
[place], [place], [address] Phone: [phone]   Fax:     Modified Barium Swallow  Patient Details  Name: Jerry English MRN: [id number] Date of Birth: 1941/02/21 Referring Provider:  Carloyn Manner, MD  Encounter Date: 12/30/2014   Subjective: Patient behavior: (alertness, ability to follow instructions, etc.): pt alert and oriented x4; followed all instructions accurately.    Chief complaint: dysphagia. Pt seen by ENT 9.26.16 w/ c/o hoarseness and straining for ~6 mos. He has been taking PPI for Reflux w/ success per pt. Pt c/o intermittent difficulty swallowing both foods and liquids and is not specific to either, or the frequency. (Pt did acknowledge more difficulty w/ Meats and Breads at end of the study.) Pt is following up w/ Outpt ST services for Voice assessment.    Objective:  Radiological Procedure: A videoflouroscopic evaluation of oral-preparatory, reflex initiation, and pharyngeal phases of the swallow was performed; as well as a screening of the upper esophageal phase.  I. POSTURE: upright II. VIEW: lateral III. COMPENSATORY STRATEGIES: IV. BOLUSES ADMINISTERED:  Thin Liquid: 5 tirals  Nectar-thick Liquid: 1 trial  Honey-thick Liquid: NT  Puree: 3 trials  Mechanical Soft: 2 trials V. RESULTS OF EVALUATION: A. ORAL PREPARATORY PHASE: (The lips, tongue, and velum are observed for strength and coordination)       **Overall Severity Rating: WFL. Pt exhibited adequate mastication and timely A-P transfer w/ all boluses; appropriate oral clearing w/ all tested.   B. SWALLOW INITIATION/REFLEX: (The reflex is normal if "triggered" by the time the bolus reached the base of the tongue)  **Overall Severity Rating: [facility]. Pt exhibited timely pharyngeal swallow initiation w/ all trial consistencies assessed. No laryngeal penetration or aspiration noted.   C. PHARYNGEAL PHASE: (Pharyngeal function  is normal if the bolus shows rapid, smooth, and continuous transit through the pharynx and there is no pharyngeal residue after the swallow)  **Overall Severity Rating: WFL-Mild. Pt trace-min. pharyngeal residue moreso in the pyriform sinuses appearing related to decreased laryngeal excursion and pharyngeal pressure during the swallow - liquids appeared to be most noted. When pt used a f/u, dry swallow (nonvolitional much of the time), this pharyngeal residue cleared. No buildup of pharyngeal residue was noted during this study.   D. LARYNGEAL PENETRATION: (Material entering into the laryngeal inlet/vestibule but not aspirated): none E. ASPIRATION: none F. ESOPHAGEAL PHASE: (Screening of the upper esophagus): min. Dysmotility in the mid-lower Esophageal area. Given time and alternating food and liquid boluses appeared to aid motility and clearing of bolus material. Also noted bony protrusions(cervical vertebrae) along the lower cervical Esophagus which can impede bolus(dense solids) motility.   ASSESSMENT: Pt presented w/ an adequate oropharyngeal swallow function; trace-min. Pharyngeal residue was noted moreso in the pyriform sinuses w/ liquids which cleared w/ f/u, dry swallowing(nonvolitional swallowing). No buildup of pharyngeal residue was noted as trials continued; no laryngeal penetration or aspiration occurred during po trials given. Of note, min. Esophageal dysmotility in the mid-lower Esophageal area as well as bony protrusions along the lower Cervical Esophagus were noted. No bolus residue remained in the cervical esophageal area, however, slower puree bolus clearing of the mid-lower Esophagus was noted. Suspect this presentation could be related to pt's c/o discomfort when swallowing denser solids(such as the meats and breads he reported).   PLAN/RECOMMENDATIONS:  A. Diet: Regular w/ meats cut small, moistened well. Lessen bread and meats together(ie, sandwiches, burgers, etc)  B. Swallowing  Precautions: general aspiration precautions;  Reflux precautions  C. Recommended consultation to: GI for any further f/u re: Esophageal motility questions and tx  D. Therapy recommendations: none at this time  E. Results and recommendations were discussed w/ pt; exam viewed by pt and sent to MDs.      End of Session - 01-12-2015 1633    Visit Number 1   Number of Visits 1   Date for SLP Re-Evaluation Jan 12, 2015   SLP Start Time 1210   SLP Stop Time  1310   SLP Time Calculation (min) 60 min   Activity Tolerance Patient tolerated treatment well      Past Medical History  Diagnosis Date  . Hypertension   . Glaucoma   . Presence of combination internal cardiac defibrillator (ICD) and pacemaker   . Presence of permanent cardiac pacemaker   . Myocardial infarction ([place])   . Seizures [facility])     Past Surgical History  Procedure Laterality Date  . Insert / replace / remove pacemaker    . Brain surgery    . Joint replacement    . Cardiac catheterization    . Esophagogastroduodenoscopy (egd) with propofol N/A 10/02/2014    Procedure: ESOPHAGOGASTRODUODENOSCOPY (EGD) WITH PROPOFOL;  Surgeon: Hulen Luster, MD;  Location: [facility] ENDOSCOPY;  Service: Gastroenterology;  Laterality: N/A;  . Colonoscopy with propofol N/A 10/02/2014    Procedure: COLONOSCOPY WITH PROPOFOL;  Surgeon: Hulen Luster, MD;  Location: [facility] ENDOSCOPY;  Service: Gastroenterology;  Laterality: N/A;    There were no vitals filed for this visit.  Visit Diagnosis: Dysphagia - Plan: DG OP Swallowing Func-Medicare/Speech Path, DG OP Swallowing Func-Medicare/Speech Path  Dysphonia - Plan: DG OP Swallowing Func-Medicare/Speech Path, DG OP Swallowing Func-Medicare/Speech Path        SLP Evaluation [facility] - 01/12/2015 0001    SLP Visit Information   SLP Received On 01-12-2015   Onset Date 12/22/2014   Medical Diagnosis Muscle Tension dysphonia   Subjective   Patient/Family Stated Goal Normal voice   Oral Motor/Sensory Function   Overall  Oral Motor/Sensory Function Appears within functional limits for tasks assessed   Motor Speech   Overall Motor Speech Appears within functional limits for tasks assessed   Phonation Impaired   Vocal Abuses Habitual Hyperphonia;Habitual Cough/Throat Clear;Glottal Attack;Other (comment)  False vocal use   Tension Present Jaw;Neck   Volume Soft   Pitch High   Standardized Assessments   Standardized Assessments  Other Assessment  Perceptual Voice Evaluation                               G-Codes - 2015/01/12 1634    Functional Assessment Tool Used clinical judgement; MBSS   Functional Limitations Swallowing   Swallow Current Status (F6213) At least 1 percent but less than 20 percent impaired, limited or restricted   Swallow Goal Status (Y8657) At least 1 percent but less than 20 percent impaired, limited or restricted   Swallow Discharge Status [phone]) At least 1 percent but less than 20 percent impaired, limited or restricted          Problem List There are no active problems to display for this patient.  Orinda Kenner, MS, CCC-SLP  Marcedes Tech 01/12/2015, 4:35 PM  [place] [facility] [place], [place], [address] Phone: [phone]   Fax:

## 2014-12-31 NOTE — Therapy (Signed)
[place] MAIN [facility] SERVICES [address] [place], [place], [address] Phone: [phone]   Fax:  [phone]  Speech Language Pathology Evaluation  Patient Details  Name: Jerry English MRN: [id number] Date of Birth: 05/14/1940 Referring Provider:  Carloyn Manner, MD  Encounter Date: 12/30/2014      End of Session - 12/31/14 1430    Visit Number 1   Number of Visits 17   Date for SLP Re-Evaluation 02/27/15   SLP Start Time 40   SLP Stop Time  76   SLP Time Calculation (min) 55 min      Past Medical History  Diagnosis Date   Hypertension    Glaucoma    Presence of combination internal cardiac defibrillator (ICD) and pacemaker    Presence of permanent cardiac pacemaker    Myocardial infarction ([place])    Seizures ([place])     Past Surgical History  Procedure Laterality Date   Insert / replace / remove pacemaker     Brain surgery     Joint replacement     Cardiac catheterization     Esophagogastroduodenoscopy (egd) with propofol N/A 10/02/2014    Procedure: ESOPHAGOGASTRODUODENOSCOPY (EGD) WITH PROPOFOL;  Surgeon: Hulen Luster, MD;  Location: [facility] ENDOSCOPY;  Service: Gastroenterology;  Laterality: N/A;   Colonoscopy with propofol N/A 10/02/2014    Procedure: COLONOSCOPY WITH PROPOFOL;  Surgeon: Hulen Luster, MD;  Location: [facility] ENDOSCOPY;  Service: Gastroenterology;  Laterality: N/A;    There were no vitals filed for this visit.  Visit Diagnosis: Dysphonia - Plan: SLP plan of care cert/re-cert      Subjective Assessment - 12/31/14 1409    Subjective Patient reports hoarseness "on and off for the past year".  He has been evaluated by Dr. Pryor Ochoa with report of severe muscle tension dysphonia.   Currently in Pain? No/denies         Perceptual Voice Evaluation  Voice history: Patient reports hoarseness on and off for the past year.  He has been evaluated by Dr. Pryor Ochoa with report of severe muscle tension  dysphonia.  Voice checklist:  Health risks: GERD  Characteristic voice use: minimal opportunities for vocal strain  Environmental risks: none reported  Misuse: strained phonation   Abuse: frequent throat clearing, some cheering/yelling as sports observer  Vocal characteristics: vocal fatigue, limited pitch range, worsening hoarseness  Patient Quality of Life Survey: Voice Handicap Index-10 Score of 17  A score of 10 or higher indicates perceived handicap  Maximum phonation time for sustained ah: 18 seconds (variable pitch and periodic aphonia)  Average fundamental frequency during sustained ah: 276 Hz (5.6 STD above average for age and gender)  Average time patient was able to sustain /s/: 5.3 seconds  Average time patient was able to sustain /z/: 6 seconds  s/z ratio : 0.9  Visi-Pitch: Multi-Dimensional Voice Program (MDVP)  MDVP extracts objective quantitative values (Relative Average Perturbation, Shimmer, Voice Turbulence Index, and Noise to Harmonic Ratio) on sustained phonation, which are displayed graphically and numerically in comparison to a built-in normative database.  The patient exhibited values within the norm for Relative Average Perturbation, Shimmer, Voice Turbulence Index, and Noise to Harmonic Ratio.  Average fundamental frequency was 5.6 STD above the average for age and gender. The patient was able to bring the pitch down to 2.5 STD above the average with minimal deterioration in parameters.        SLP Long Term Goals - 12/31/14 1412    SLP LONG  TERM GOAL #1   Title The patient will demonstrate independent understanding of vocal hygiene concepts and neck, shoulder, lingual stretching exercises.   Time 8   Period Weeks   Status New   SLP LONG TERM GOAL #2   Title The patient will be independent for abdominal breathing and breath support exercises.   Time 8   Period Weeks   Status New   SLP LONG TERM GOAL #3   Title The patient will minimize  vocal tension via Yawn-Sigh approach (or comparable technique) with min SLP cues with 80% accuracy.   Time 8   Period Weeks   Status New   SLP LONG TERM GOAL #4   Title The patient will maintain relaxed phonation / oral resonance for paragraph length recitation with 80% accuracy.   Time 8   Period Weeks   Status New          Plan - January 21, 2015 1411    Clinical Impression Statement This 72 year man under the care of Dr. Pryor Ochoa, with severe muscle tension dysphonia, is presenting with moderate-severe dysphonia characterized by hoarse vocal quality, reduced breath support and control for speech, reduced pitch range, vocal fatigue, and laryngeal tension.  The patient will benefit from voice therapy for education, to improve breath control/support for speech, and reduce laryngeal tension, and learn techniques to increase loudness and pitch range without strain.      Speech Therapy Frequency 2x / week   Duration Other (comment)  8 weeks   Treatment/Interventions Other (comment)  Voice therapy   Potential to Achieve Goals Good   Potential Considerations Ability to learn/carryover information;Cooperation/participation level;Medical prognosis;Previous level of function;Family/community support   SLP Home Exercise Plan To be developed   Consulted and Agree with Plan of Care Patient          G-Codes - 01-21-15 1424    Functional Assessment Tool Used Perceptual voice evaluation   Functional Limitations Voice   Voice Current Status (G9171) At least 60 percent but less than 80 percent impaired, limited or restricted   Voice Goal Status (G9172) At least 20 percent but less than 40 percent impaired, limited or restricted      Problem List There are no active problems to display for this patient.  Leroy Sea, MS/CCC- SLP  Lou Miner 01-21-2015, 2:32 PM  [place] MAIN [facility] SERVICES [address] [place], [place], [address] Phone:  [phone]   Fax:  [phone]

## 2015-01-02 ENCOUNTER — Ambulatory Visit: Payer: Medicare Other | Admitting: Speech Pathology

## 2015-01-02 ENCOUNTER — Encounter: Payer: Self-pay | Admitting: Speech Pathology

## 2015-01-02 DIAGNOSIS — R490 Dysphonia: Secondary | ICD-10-CM | POA: Diagnosis not present

## 2015-01-02 NOTE — Therapy (Signed)
[place] MAIN [facility] SERVICES [address] [place], [place], [address] Phone: [phone]   Fax:  [phone]  Speech Language Pathology Treatment  Patient Details  Name: Jerry English MRN: [id number] Date of Birth: Mar 26, 1941 Referring Provider:  Carloyn Manner, MD  Encounter Date: 01/02/2015      End of Session - 01/02/15 1551    Visit Number 2   Number of Visits 17   Date for SLP Re-Evaluation 02/27/15   SLP Start Time 1400   SLP Stop Time  8850   SLP Time Calculation (min) 47 min   Activity Tolerance Patient tolerated treatment well      Past Medical History  Diagnosis Date  . Hypertension   . Glaucoma   . Presence of combination internal cardiac defibrillator (ICD) and pacemaker   . Presence of permanent cardiac pacemaker   . Myocardial infarction ([place])   . Seizures [facility])     Past Surgical History  Procedure Laterality Date  . Insert / replace / remove pacemaker    . Brain surgery    . Joint replacement    . Cardiac catheterization    . Esophagogastroduodenoscopy (egd) with propofol N/A 10/02/2014    Procedure: ESOPHAGOGASTRODUODENOSCOPY (EGD) WITH PROPOFOL;  Surgeon: Hulen Luster, MD;  Location: [facility] ENDOSCOPY;  Service: Gastroenterology;  Laterality: N/A;  . Colonoscopy with propofol N/A 10/02/2014    Procedure: COLONOSCOPY WITH PROPOFOL;  Surgeon: Hulen Luster, MD;  Location: [facility] ENDOSCOPY;  Service: Gastroenterology;  Laterality: N/A;    There were no vitals filed for this visit.  Visit Diagnosis: Dysphonia      Subjective Assessment - 01/02/15 1550    Subjective The patient is eager to improve his vocal quality.   Currently in Pain? No/denies               ADULT SLP TREATMENT - 01/02/15 0001    General Information   Behavior/Cognition Alert;Cooperative;Pleasant mood   HPI Muscle tension dysphonia   Treatment Provided   Treatment provided Cognitive-Linquistic   Pain Assessment   Pain Assessment  No/denies pain   Cognitive-Linquistic Treatment   Treatment focused on Voice   Skilled Treatment The patient was provided with written and verbal teaching regarding neck and shoulder relaxation exercises to promote relaxed phonation.  The patient demonstrates good performance independently.  The patient was provided with written and verbal teaching regarding selected muscle tension exercises and supplemental vocal tract relaxation exercises. He demonstrates good performance.  The patient was provided with written and verbal teaching regarding breath support exercises.  Improved abdominal breathing, but needs more work to improve breath support.  Patient instructed in relaxed phonation / oral resonance.   Patient able to improve vocal quality with easy onset, continuous flow phonation, and vocal loudness to decrease laryngeal strain. With focus on vocal loudness "project your voice loud and clear" patient able to imitate initial /h/ words with stronger, better quality voice.  Maintain learned vocal clarity for reading initial /h/ words with 50% accuracy.   Assessment / Recommendations / Plan   Plan Continue with current plan of care   Progression Toward Goals   Progression toward goals Progressing toward goals          SLP Education - 01/02/15 1550    Education Details neck and tongue stretches, breath support exercises, oral resonance, vocal loudness   Person(s) Educated Patient   Methods Explanation;Demonstration;Handout   Comprehension Verbalized understanding;Returned demonstration;Verbal cues required;Need further instruction  SLP Long Term Goals - 12/31/14 1412    SLP LONG TERM GOAL #1   Title The patient will demonstrate independent understanding of vocal hygiene concepts and neck, shoulder, lingual stretching exercises.   Time 8   Period Weeks   Status New   SLP LONG TERM GOAL #2   Title The patient will be independent for abdominal breathing and breath support  exercises.   Time 8   Period Weeks   Status New   SLP LONG TERM GOAL #3   Title The patient will minimize vocal tension via Yawn-Sigh approach (or comparable technique) with min SLP cues with 80% accuracy.   Time 8   Period Weeks   Status New   SLP LONG TERM GOAL #4   Title The patient will maintain relaxed phonation / oral resonance for paragraph length recitation with 80% accuracy.   Time 8   Period Weeks   Status New          Plan - 01/02/15 1551    Clinical Impression Statement  The patient able to improve vocal quality with easy onset, continuous flow phonation, and vocal loudness to decrease laryngeal strain.   Speech Therapy Frequency 2x / week   Duration Other (comment)   Treatment/Interventions Other (comment)  Voice therapy   Potential to Achieve Goals Good   Potential Considerations Ability to learn/carryover information;Cooperation/participation level;Medical prognosis;Previous level of function;Family/community support   SLP Home Exercise Plan neck and tongue stretches, breath support exercises, easy onset words   Consulted and Agree with Plan of Care Patient        Problem List There are no active problems to display for this patient.  Leroy Sea, MS/CCC- SLP  Lou Miner 01/02/2015, 3:53 PM  [place] MAIN [facility] SERVICES [address] [place], [place], [address] Phone: [phone]   Fax:  [phone]

## 2015-01-05 ENCOUNTER — Encounter: Payer: Self-pay | Admitting: Speech Pathology

## 2015-01-05 ENCOUNTER — Ambulatory Visit: Payer: Medicare Other | Admitting: Speech Pathology

## 2015-01-05 DIAGNOSIS — R490 Dysphonia: Secondary | ICD-10-CM | POA: Diagnosis not present

## 2015-01-05 NOTE — Therapy (Signed)
[place] MAIN [facility] SERVICES [address] [place], [place], [address] Phone: [phone]   Fax:  [phone]  Speech Language Pathology Treatment  Patient Details  Name: KEKOA FYOCK MRN: [id number] Date of Birth: [birth year] Referring Provider:  Carloyn Manner, MD  Encounter Date: 01/05/2015      End of Session - 01/05/15 1127    Visit Number 3   Number of Visits 17   Date for SLP Re-Evaluation 02/27/15   SLP Start Time 1000   SLP Stop Time  7322   SLP Time Calculation (min) 47 min   Activity Tolerance Patient tolerated treatment well      Past Medical History  Diagnosis Date  . Hypertension   . Glaucoma   . Presence of combination internal cardiac defibrillator (ICD) and pacemaker   . Presence of permanent cardiac pacemaker   . Myocardial infarction ([place])   . Seizures [facility])     Past Surgical History  Procedure Laterality Date  . Insert / replace / remove pacemaker    . Brain surgery    . Joint replacement    . Cardiac catheterization    . Esophagogastroduodenoscopy (egd) with propofol N/A 10/02/2014    Procedure: ESOPHAGOGASTRODUODENOSCOPY (EGD) WITH PROPOFOL;  Surgeon: Hulen Luster, MD;  Location: [facility] ENDOSCOPY;  Service: Gastroenterology;  Laterality: N/A;  . Colonoscopy with propofol N/A 10/02/2014    Procedure: COLONOSCOPY WITH PROPOFOL;  Surgeon: Hulen Luster, MD;  Location: [facility] ENDOSCOPY;  Service: Gastroenterology;  Laterality: N/A;    There were no vitals filed for this visit.  Visit Diagnosis: Dysphonia      Subjective Assessment - 01/05/15 1126    Subjective The patient is doing his home assignments   Currently in Pain? No/denies               ADULT SLP TREATMENT - 01/05/15 0001    General Information   Behavior/Cognition Alert;Cooperative;Pleasant mood   HPI Muscle tension dysphonia   Treatment Provided   Treatment provided Cognitive-Linquistic   Pain Assessment   Pain Assessment No/denies pain    Cognitive-Linquistic Treatment   Treatment focused on Voice   Skilled Treatment The patient was provided with written and verbal teaching regarding neck and shoulder relaxation exercises to promote relaxed phonation.  The patient demonstrates good performance independently.  The patient was provided with written and verbal teaching regarding selected muscle tension exercises and supplemental vocal tract relaxation exercises. He demonstrates good performance.  The patient was provided with written and verbal teaching regarding breath support exercises.  Improved abdominal breathing and ability to sustain airflow (sustained /s/ up to 20 seconds from baseline of 5 seconds).  Patient instructed in relaxed phonation / oral resonance.   Patient able to improve vocal quality with easy onset, continuous flow phonation, and vocal loudness to decrease laryngeal strain. With focus on vocal loudness "project your voice loud and clear" patient able to imitate initial /h/ words with stronger, better quality voice.  Maintain learned vocal clarity for reading initial /h/ words with 80% accuracy.  Maintain vocal clarity for reading phrases/sentences with 65% accuracy.   Assessment / Recommendations / Plan   Plan Continue with current plan of care   Progression Toward Goals   Progression toward goals Progressing toward goals          SLP Education - 01/05/15 1126    Education provided Yes   Education Details neck and tongue stretches, breath support exercises, oral resonance, vocal loudness  Person(s) Educated Patient   Methods Explanation;Demonstration;Handout;Verbal cues   Comprehension Verbalized understanding;Returned demonstration;Verbal cues required;Need further instruction            SLP Long Term Goals - 12/31/14 [place] #1   Title The patient will demonstrate independent understanding of vocal hygiene concepts and neck, shoulder, lingual stretching exercises.   Time 8    Period Weeks   Status New   SLP LONG TERM GOAL #2   Title The patient will be independent for abdominal breathing and breath support exercises.   Time 8   Period Weeks   Status New   SLP LONG TERM GOAL #3   Title The patient will minimize vocal tension via Yawn-Sigh approach (or comparable technique) with min SLP cues with 80% accuracy.   Time 8   Period Weeks   Status New   SLP LONG TERM GOAL #4   Title The patient will maintain relaxed phonation / oral resonance for paragraph length recitation with 80% accuracy.   Time 8   Period Weeks   Status New          Plan - 01/05/15 1127    Clinical Impression Statement  The patient able to improve vocal quality with easy onset, continuous flow phonation, and vocal loudness to decrease laryngeal strain.   Speech Therapy Frequency 2x / week   Duration Other (comment)   Treatment/Interventions Other (comment)  Voice treatment   Potential to Achieve Goals Good   Potential Considerations Ability to learn/carryover information;Cooperation/participation level;Medical prognosis;Previous level of function;Family/community support   SLP Home Exercise Plan neck and tongue stretches, breath support exercises, easy onset words, instructed to elicits his wife's help in judging his vocal quality   Consulted and Agree with Plan of Care Patient        Problem List There are no active problems to display for this patient.  Leroy Sea, MS/CCC- SLP  Lou Miner 01/05/2015, 11:30 AM  [place] MAIN [facility] SERVICES [address] [place], [place], [address] Phone: [phone]   Fax:  [phone]

## 2015-01-09 ENCOUNTER — Ambulatory Visit: Payer: Medicare Other | Admitting: Speech Pathology

## 2015-01-09 ENCOUNTER — Encounter: Payer: Self-pay | Admitting: Speech Pathology

## 2015-01-09 DIAGNOSIS — R490 Dysphonia: Secondary | ICD-10-CM

## 2015-01-09 NOTE — Therapy (Signed)
[place] MAIN [facility] SERVICES [address] [place], [place], [address] Phone: [phone]   Fax:  [phone]  Speech Language Pathology Treatment  Patient Details  Name: Jerry English MRN: [id number] Date of Birth: [birth year] No Data Recorded  Encounter Date: 01/09/2015      End of Session - 01/09/15 1145    Visit Number 4   Number of Visits 17   Date for SLP Re-Evaluation 02/27/15   SLP Start Time 0947   SLP Stop Time  1035   SLP Time Calculation (min) 48 min   Activity Tolerance Patient tolerated treatment well      Past Medical History  Diagnosis Date  . Hypertension   . Glaucoma   . Presence of combination internal cardiac defibrillator (ICD) and pacemaker   . Presence of permanent cardiac pacemaker   . Myocardial infarction ([place])   . Seizures [facility])     Past Surgical History  Procedure Laterality Date  . Insert / replace / remove pacemaker    . Brain surgery    . Joint replacement    . Cardiac catheterization    . Esophagogastroduodenoscopy (egd) with propofol N/A 10/02/2014    Procedure: ESOPHAGOGASTRODUODENOSCOPY (EGD) WITH PROPOFOL;  Surgeon: Hulen Luster, MD;  Location: [facility] ENDOSCOPY;  Service: Gastroenterology;  Laterality: N/A;  . Colonoscopy with propofol N/A 10/02/2014    Procedure: COLONOSCOPY WITH PROPOFOL;  Surgeon: Hulen Luster, MD;  Location: [facility] ENDOSCOPY;  Service: Gastroenterology;  Laterality: N/A;    There were no vitals filed for this visit.  Visit Diagnosis: No diagnosis found.      Subjective Assessment - 01/09/15 1143    Subjective The patient reports satisfaction with progression of voice and indicated he had no concerns or questions about therapy.   Currently in Pain? No/denies               ADULT SLP TREATMENT - 01/09/15 0001    General Information   Behavior/Cognition Alert;Cooperative;Pleasant mood   HPI Muscle tension dysphonia   Treatment Provided   Treatment provided  Cognitive-Linquistic   Pain Assessment   Pain Assessment No/denies pain   Cognitive-Linquistic Treatment   Treatment focused on Voice   Skilled Treatment The patient was provided with written and verbal teaching regarding neck and shoulder relaxation exercises to promote relaxed phonation.  The patient demonstrates good performance independently.  The patient was provided with written and verbal teaching regarding selected muscle tension exercises and supplemental vocal tract relaxation exercises. He demonstrates good performance.  The patient was provided with written and verbal teaching regarding breath support exercises.  Improved abdominal breathing and ability to sustain airflow (sustained /s/ up to 12 seconds from baseline of 5 seconds).  Patient instructed in relaxed phonation / oral resonance.   Patient able to improve vocal quality with easy onset, continuous flow phonation, and vocal loudness to decrease laryngeal strain. With focus on vocal loudness "project your voice loud and clear" patient able to imitate initial /h/ words with stronger, better quality voice.  Maintain learned vocal clarity for reading initial /h/ words with 90% accuracy.  Maintain vocal clarity for reading phrases/sentences with 85% accuracy.   Assessment / Recommendations / Plan   Plan Continue with current plan of care   Progression Toward Goals   Progression toward goals Progressing toward goals          SLP Education - 01/09/15 1144    Education provided Yes   Education Details Using vocal loudness  and continuous flow phonation (called "making it gentler") at the phrase and sentence level   Person(s) Educated Patient   Methods Explanation;Demonstration;Verbal cues   Comprehension Verbalized understanding;Returned demonstration;Verbal cues required;Need further instruction            SLP Long Term Goals - 12/31/14 1412    SLP LONG TERM GOAL #1   Title The patient will demonstrate independent  understanding of vocal hygiene concepts and neck, shoulder, lingual stretching exercises.   Time 8   Period Weeks   Status New   SLP LONG TERM GOAL #2   Title The patient will be independent for abdominal breathing and breath support exercises.   Time 8   Period Weeks   Status New   SLP LONG TERM GOAL #3   Title The patient will minimize vocal tension via Yawn-Sigh approach (or comparable technique) with min SLP cues with 80% accuracy.   Time 8   Period Weeks   Status New   SLP LONG TERM GOAL #4   Title The patient will maintain relaxed phonation / oral resonance for paragraph length recitation with 80% accuracy.   Time 8   Period Weeks   Status New          Plan - 01/09/15 1146    Clinical Impression Statement  The patient able to improve vocal quality with easy onset, continuous flow phonation, and vocal loudness to decrease laryngeal strain.   Speech Therapy Frequency 2x / week   Duration Other (comment)   Treatment/Interventions Other (comment)  Voice treatment   Potential to Achieve Goals Good   Potential Considerations Ability to learn/carryover information;Cooperation/participation level;Medical prognosis;Previous level of function;Family/community support   SLP Home Exercise Plan neck and tongue stretches, breath support exercises, easy onset words, instructed to elicits his wife's help in judging his vocal quality   Consulted and Agree with Plan of Care Patient        Problem List There are no active problems to display for this patient.   Rocco Serene 01/09/2015, 11:47 AM  [place] MAIN [facility] SERVICES [address] [place], [place], [address] Phone: [phone]   Fax:  [phone]   Name: Jerry English MRN: [id number] Date of Birth: 1941/01/07

## 2015-01-14 ENCOUNTER — Ambulatory Visit: Payer: Medicare Other | Admitting: Speech Pathology

## 2015-01-14 ENCOUNTER — Encounter: Payer: Self-pay | Admitting: Speech Pathology

## 2015-01-14 DIAGNOSIS — R490 Dysphonia: Secondary | ICD-10-CM

## 2015-01-14 NOTE — Therapy (Signed)
[place] MAIN [facility] SERVICES [address] [place], [place], [address] Phone: [phone]   Fax:  [phone]  Speech Language Pathology Treatment  Patient Details  Name: Jerry English MRN: [id number] Date of Birth: 06/10/1940 No Data Recorded  Encounter Date: 01/14/2015      End of Session - 01/14/15 0946    Visit Number 5   Number of Visits 17   Date for SLP Re-Evaluation 02/27/15   SLP Start Time 0855   SLP Stop Time  0941   SLP Time Calculation (min) 46 min   Activity Tolerance Patient tolerated treatment well      Past Medical History  Diagnosis Date  . Hypertension   . Glaucoma   . Presence of combination internal cardiac defibrillator (ICD) and pacemaker   . Presence of permanent cardiac pacemaker   . Myocardial infarction ([place])   . Seizures [facility])     Past Surgical History  Procedure Laterality Date  . Insert / replace / remove pacemaker    . Brain surgery    . Joint replacement    . Cardiac catheterization    . Esophagogastroduodenoscopy (egd) with propofol N/A 10/02/2014    Procedure: ESOPHAGOGASTRODUODENOSCOPY (EGD) WITH PROPOFOL;  Surgeon: Hulen Luster, MD;  Location: [facility] ENDOSCOPY;  Service: Gastroenterology;  Laterality: N/A;  . Colonoscopy with propofol N/A 10/02/2014    Procedure: COLONOSCOPY WITH PROPOFOL;  Surgeon: Hulen Luster, MD;  Location: [facility] ENDOSCOPY;  Service: Gastroenterology;  Laterality: N/A;    There were no vitals filed for this visit.  Visit Diagnosis: No diagnosis found.      Subjective Assessment - 01/14/15 0944    Subjective The patient reports satisfaction with progression of voice and indicated he had no concerns or questions about therapy. He says his wife is able to give him feedback on his voice quality throughout the day which helps him to correct it in his natural environment.   Currently in Pain? No/denies               ADULT SLP TREATMENT - 01/14/15 0001    General  Information   Behavior/Cognition Alert;Cooperative;Pleasant mood   HPI Muscle tension dysphonia   Treatment Provided   Treatment provided Cognitive-Linquistic   Pain Assessment   Pain Assessment No/denies pain   Cognitive-Linquistic Treatment   Treatment focused on Voice   Skilled Treatment The patient was provided with written and verbal teaching regarding neck and shoulder relaxation exercises to promote relaxed phonation.  The patient demonstrates good performance independently.  The patient was provided with written and verbal teaching regarding selected muscle tension exercises and supplemental vocal tract relaxation exercises. He demonstrates good performance.  The patient was provided with written and verbal teaching regarding breath support exercises.  Improved abdominal breathing and ability to sustain airflow (sustained /s/ up to 12 seconds from baseline of 5 seconds).  Patient instructed in relaxed phonation / oral resonance.   Patient able to improve vocal quality with easy onset, continuous flow phonation, and vocal loudness to decrease laryngeal strain. With focus on vocal loudness "project your voice loud and clear" patient able to imitate initial /h/ words with stronger, better quality voice.  Maintain learned vocal clarity for reading initial /h/ words with 90% accuracy.  Maintain vocal clarity for reading phrases/sentences with 90% accuracy. In reading paragraphs, pt. had 80% accuracy that declined to 65% accuracy after approximately 10 minutes reading. Patient was 50% accurate at the conversational level but was using a  good, functional voice.   Assessment / Recommendations / Plan   Plan Continue with current plan of care   Progression Toward Goals   Progression toward goals Progressing toward goals          SLP Education - 01/14/15 0945    Education provided Yes   Education Details Utilizing home exercises that he finds beneficial while focusing less on ones he thinks are not  beneficial.   Person(s) Educated Patient   Methods Explanation   Comprehension Verbalized understanding            SLP Long Term Goals - 12/31/14 1412    SLP LONG TERM GOAL #1   Title The patient will demonstrate independent understanding of vocal hygiene concepts and neck, shoulder, lingual stretching exercises.   Time 8   Period Weeks   Status New   SLP LONG TERM GOAL #2   Title The patient will be independent for abdominal breathing and breath support exercises.   Time 8   Period Weeks   Status New   SLP LONG TERM GOAL #3   Title The patient will minimize vocal tension via Yawn-Sigh approach (or comparable technique) with min SLP cues with 80% accuracy.   Time 8   Period Weeks   Status New   SLP LONG TERM GOAL #4   Title The patient will maintain relaxed phonation / oral resonance for paragraph length recitation with 80% accuracy.   Time 8   Period Weeks   Status New          Plan - 01/14/15 0947    Clinical Impression Statement  The patient able to improve vocal quality with easy onset, continuous flow phonation, and vocal loudness to decrease laryngeal strain. The patient is still having some difficulty with self-monitoring but this has not hindered his transfer of skills learned in therapy into the voice he uses daily for conversation and functional tasks.   Speech Therapy Frequency 2x / week   Duration Other (comment)   Treatment/Interventions Other (comment)  Voice treatment   Potential to Achieve Goals Good   Potential Considerations Ability to learn/carryover information;Cooperation/participation level;Medical prognosis;Previous level of function;Family/community support   SLP Home Exercise Plan neck and tongue stretches, breath support exercises, easy onset words, instructed to elicits his wife's help in judging his vocal quality   Consulted and Agree with Plan of Care Patient        Problem List There are no active problems to display for this  patient.   Rocco Serene 01/14/2015, 9:48 AM  [place] MAIN [facility] SERVICES [address] [place], [place], [address] Phone: [phone]   Fax:  [phone]   Name: Jerry English MRN: [id number] Date of Birth: 30-Aug-1940

## 2015-01-16 ENCOUNTER — Ambulatory Visit: Payer: Medicare Other | Admitting: Speech Pathology

## 2015-01-16 ENCOUNTER — Encounter: Payer: Self-pay | Admitting: Speech Pathology

## 2015-01-16 DIAGNOSIS — R490 Dysphonia: Secondary | ICD-10-CM

## 2015-01-16 NOTE — Therapy (Signed)
[place] MAIN [facility] SERVICES [address] [place], [place], [address] Phone: [phone]   Fax:  [phone]  Speech Language Pathology Treatment  Patient Details  Name: Jerry English MRN: [id number] Date of Birth: 1941/01/20 No Data Recorded  Encounter Date: 01/16/2015      End of Session - 01/16/15 1617    Visit Number 6   Number of Visits 17   Date for SLP Re-Evaluation 02/27/15   SLP Start Time 1000   SLP Stop Time  1050   SLP Time Calculation (min) 50 min   Activity Tolerance Patient tolerated treatment well      Past Medical History  Diagnosis Date  . Hypertension   . Glaucoma   . Presence of combination internal cardiac defibrillator (ICD) and pacemaker   . Presence of permanent cardiac pacemaker   . Myocardial infarction ([place])   . Seizures [facility])     Past Surgical History  Procedure Laterality Date  . Insert / replace / remove pacemaker    . Brain surgery    . Joint replacement    . Cardiac catheterization    . Esophagogastroduodenoscopy (egd) with propofol N/A 10/02/2014    Procedure: ESOPHAGOGASTRODUODENOSCOPY (EGD) WITH PROPOFOL;  Surgeon: Hulen Luster, MD;  Location: [facility] ENDOSCOPY;  Service: Gastroenterology;  Laterality: N/A;  . Colonoscopy with propofol N/A 10/02/2014    Procedure: COLONOSCOPY WITH PROPOFOL;  Surgeon: Hulen Luster, MD;  Location: [facility] ENDOSCOPY;  Service: Gastroenterology;  Laterality: N/A;    There were no vitals filed for this visit.  Visit Diagnosis: Dysphonia      Subjective Assessment - 01/16/15 1614    Subjective The patient and his wife feel that voice therapy is "not doing any good"   Currently in Pain? No/denies               ADULT SLP TREATMENT - 01/16/15 0001    General Information   Behavior/Cognition Alert;Cooperative;Pleasant mood   HPI Muscle tension dysphonia   Treatment Provided   Treatment provided Cognitive-Linquistic   Pain Assessment   Pain Assessment No/denies  pain   Cognitive-Linquistic Treatment   Treatment focused on Voice   Skilled Treatment The patient was provided with written and verbal teaching regarding neck and shoulder relaxation exercises to promote relaxed phonation.  The patient demonstrates good performance independently.  The patient was provided with written and verbal teaching regarding selected muscle tension exercises and supplemental vocal tract relaxation exercises. He demonstrates good performance.  The patient was provided with written and verbal teaching regarding breath support exercises.  Patient instructed in relaxed phonation / oral resonance.   Patient able to improve vocal quality with easy onset, continuous flow phonation, and vocal loudness to decrease laryngeal strain. With focus on vocal loudness "project your voice loud and clear" patient able to read aloud sentences, maintaining vocal clarity with 70% accuracy.  The patient reports that he is not able to hear poor vocal quality nor feel poor vocal quality, due to strain, in the larynx.  He has been provided with information regarding voice building and guidelines for the patient and his wife to proceed with this program.   Assessment / Recommendations / Plan   Plan Continue with current plan of care   Progression Toward Goals   Progression toward goals Progressing toward goals          SLP Education - 01/16/15 1616    Education Details Voice building program, information regarding efficacy of voice building  exercises   Person(s) Educated Patient   Methods Explanation;Handout   Comprehension Verbalized understanding;Returned demonstration;Need further instruction            SLP Long Term Goals - 12/31/14 1412    SLP LONG TERM GOAL #1   Title The patient will demonstrate independent understanding of vocal hygiene concepts and neck, shoulder, lingual stretching exercises.   Time 8   Period Weeks   Status New   SLP LONG TERM GOAL #2   Title The patient will  be independent for abdominal breathing and breath support exercises.   Time 8   Period Weeks   Status New   SLP LONG TERM GOAL #3   Title The patient will minimize vocal tension via Yawn-Sigh approach (or comparable technique) with min SLP cues with 80% accuracy.   Time 8   Period Weeks   Status New   SLP LONG TERM GOAL #4   Title The patient will maintain relaxed phonation / oral resonance for paragraph length recitation with 80% accuracy.   Time 8   Period Weeks   Status New          Plan - 01/16/15 1618    Clinical Impression Statement  The patient able to improve vocal quality with easy onset, continuous flow phonation, and vocal loudness to decrease laryngeal strain, with focus on loudness.  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  The patient and his wife will review the voice building information and exercise program and let us know if the patient wishes to continue with direct voice therapy.   Speech Therapy Frequency 2x / week   Duration Other (comment)   Treatment/Interventions Other (comment)  Voice therapy   Potential to Achieve Goals Good   Potential Considerations Ability to learn/carryover information;Cooperation/participation level;Medical prognosis;Previous level of function;Family/community support   SLP Home Exercise Plan Voice building program, information regarding efficacy of voice building exercises        Problem List There are no active problems to display for this patient.  Leroy Sea, MS/CCC- SLP  Lou Miner 01/16/2015, 4:20 PM  [place] MAIN [facility] SERVICES [address] [place], [place], [address] Phone: [phone]   Fax:  [phone]   Name: Jerry English MRN: [id number] Date of Birth: 11-07-1940

## 2015-01-19 ENCOUNTER — Ambulatory Visit: Payer: Medicare Other | Admitting: Speech Pathology

## 2015-01-21 ENCOUNTER — Ambulatory Visit: Payer: PRIVATE HEALTH INSURANCE | Admitting: Speech Pathology

## 2015-02-26 ENCOUNTER — Encounter
Admission: RE | Admit: 2015-02-26 | Discharge: 2015-02-26 | Disposition: A | Discharge status: Home / Self Care | Payer: Medicare Other | Source: Ambulatory Visit | Attending: Otolaryngology | Admitting: Otolaryngology

## 2015-02-26 NOTE — Patient Instructions (Addendum)
  Your procedure is scheduled on: 03/05/15 Report to Day Surgery. To find out your arrival time please call [phone] between 1PM - 3PM on 03/04/15.  Remember: Instructions that are not followed completely may result in serious medical risk, up to and including death, or upon the discretion of your surgeon and anesthesiologist your surgery may need to be rescheduled.    __x__ 1. Do not eat food or drink liquids after midnight. No gum chewing or hard candies.     ___x_ 2. No Alcohol for 24 hours before or after surgery.   ____ 3. Bring all medications with you on the day of surgery if instructed.    __x__ 4. Notify your doctor if there is any change in your medical condition     (cold, fever, infections).     Do not wear jewelry, make-up, hairpins, clips or nail polish.  Do not wear lotions, powders, or perfumes. You may wear deodorant.  Do not shave 48 hours prior to surgery. Men may shave face and neck.  Do not bring valuables to the hospital.    [facility] is not responsible for any belongings or valuables.               Contacts, dentures or bridgework may not be worn into surgery.  Leave your suitcase in the car. After surgery it may be brought to your room.  For patients admitted to the hospital, discharge time is determined by your                treatment team.   Patients discharged the day of surgery will not be allowed to drive home.   Please read over the following fact sheets that you were given:   Surgical Site Infection Prevention   ____ Take these medicines the morning of surgery with A SIP OF WATER:    1. protonix  2. amrodarone  3. lorsartan  4. simvastatin  5.eye drops  6.  ____ Fleet Enema (as directed)   ____ Use CHG Soap as directed  ____ Use inhalers on the day of surgery  ____ Stop metformin 2 days prior to surgery    ____ Take 1/2 of usual insulin dose the night before surgery and none on the morning of surgery.   __x__ Stop  Coumadin/Plavix/aspirin on may continue asa per Dr. Pryor Ochoa  __x__ Stop Anti-inflammatories on tylenol only for pain   ____ Stop supplements until after surgery.    ____ Bring C-Pap to the hospital.

## 2015-03-04 DIAGNOSIS — I110 Hypertensive heart disease with heart failure: Secondary | ICD-10-CM | POA: Diagnosis not present

## 2015-03-04 DIAGNOSIS — Z7982 Long term (current) use of aspirin: Secondary | ICD-10-CM | POA: Diagnosis not present

## 2015-03-04 DIAGNOSIS — E669 Obesity, unspecified: Secondary | ICD-10-CM | POA: Diagnosis not present

## 2015-03-04 DIAGNOSIS — Z79899 Other long term (current) drug therapy: Secondary | ICD-10-CM | POA: Diagnosis not present

## 2015-03-04 DIAGNOSIS — R490 Dysphonia: Secondary | ICD-10-CM | POA: Diagnosis present

## 2015-03-04 DIAGNOSIS — I4891 Unspecified atrial fibrillation: Secondary | ICD-10-CM | POA: Diagnosis not present

## 2015-03-04 DIAGNOSIS — F329 Major depressive disorder, single episode, unspecified: Secondary | ICD-10-CM | POA: Diagnosis not present

## 2015-03-04 DIAGNOSIS — I509 Heart failure, unspecified: Secondary | ICD-10-CM | POA: Diagnosis not present

## 2015-03-04 DIAGNOSIS — Z950 Presence of cardiac pacemaker: Secondary | ICD-10-CM | POA: Diagnosis not present

## 2015-03-04 DIAGNOSIS — Z8673 Personal history of transient ischemic attack (TIA), and cerebral infarction without residual deficits: Secondary | ICD-10-CM | POA: Diagnosis not present

## 2015-03-04 DIAGNOSIS — H409 Unspecified glaucoma: Secondary | ICD-10-CM | POA: Diagnosis not present

## 2015-03-04 DIAGNOSIS — J387 Other diseases of larynx: Secondary | ICD-10-CM | POA: Diagnosis not present

## 2015-03-04 DIAGNOSIS — Z8669 Personal history of other diseases of the nervous system and sense organs: Secondary | ICD-10-CM | POA: Diagnosis not present

## 2015-03-04 DIAGNOSIS — E7800 Pure hypercholesterolemia, unspecified: Secondary | ICD-10-CM | POA: Diagnosis not present

## 2015-03-04 DIAGNOSIS — Z6829 Body mass index (BMI) 29.0-29.9, adult: Secondary | ICD-10-CM | POA: Diagnosis not present

## 2015-03-05 ENCOUNTER — Ambulatory Visit: Payer: Medicare Other | Admitting: Anesthesiology

## 2015-03-05 ENCOUNTER — Encounter
Admission: RE | Disposition: A | Discharge status: Home / Self Care | Payer: Self-pay | Source: Ambulatory Visit | Attending: Otolaryngology

## 2015-03-05 ENCOUNTER — Ambulatory Visit
Admission: RE | Admit: 2015-03-05 | Discharge: 2015-03-05 | Disposition: A | Discharge status: Home / Self Care | Payer: Medicare Other | Source: Ambulatory Visit | Attending: Otolaryngology | Admitting: Otolaryngology

## 2015-03-05 DIAGNOSIS — Z950 Presence of cardiac pacemaker: Secondary | ICD-10-CM | POA: Insufficient documentation

## 2015-03-05 DIAGNOSIS — Z7982 Long term (current) use of aspirin: Secondary | ICD-10-CM | POA: Insufficient documentation

## 2015-03-05 DIAGNOSIS — Z79899 Other long term (current) drug therapy: Secondary | ICD-10-CM | POA: Insufficient documentation

## 2015-03-05 DIAGNOSIS — R490 Dysphonia: Secondary | ICD-10-CM | POA: Insufficient documentation

## 2015-03-05 DIAGNOSIS — I509 Heart failure, unspecified: Secondary | ICD-10-CM | POA: Insufficient documentation

## 2015-03-05 DIAGNOSIS — J387 Other diseases of larynx: Secondary | ICD-10-CM | POA: Diagnosis not present

## 2015-03-05 DIAGNOSIS — Z8673 Personal history of transient ischemic attack (TIA), and cerebral infarction without residual deficits: Secondary | ICD-10-CM | POA: Insufficient documentation

## 2015-03-05 DIAGNOSIS — I110 Hypertensive heart disease with heart failure: Secondary | ICD-10-CM | POA: Insufficient documentation

## 2015-03-05 DIAGNOSIS — E7800 Pure hypercholesterolemia, unspecified: Secondary | ICD-10-CM | POA: Insufficient documentation

## 2015-03-05 DIAGNOSIS — I4891 Unspecified atrial fibrillation: Secondary | ICD-10-CM | POA: Insufficient documentation

## 2015-03-05 DIAGNOSIS — Z6829 Body mass index (BMI) 29.0-29.9, adult: Secondary | ICD-10-CM | POA: Insufficient documentation

## 2015-03-05 DIAGNOSIS — H409 Unspecified glaucoma: Secondary | ICD-10-CM | POA: Insufficient documentation

## 2015-03-05 DIAGNOSIS — E669 Obesity, unspecified: Secondary | ICD-10-CM | POA: Insufficient documentation

## 2015-03-05 DIAGNOSIS — F329 Major depressive disorder, single episode, unspecified: Secondary | ICD-10-CM | POA: Insufficient documentation

## 2015-03-05 DIAGNOSIS — Z8669 Personal history of other diseases of the nervous system and sense organs: Secondary | ICD-10-CM | POA: Insufficient documentation

## 2015-03-05 HISTORY — PX: MICROLARYNGOSCOPY W/VOCAL CORD INJECTION: SHX2665

## 2015-03-05 SURGERY — MICROLARYNGOSCOPY, WITH VOCAL CORD INJECTION
Anesthesia: General

## 2015-03-05 MED ORDER — LACTATED RINGERS IV SOLN
INTRAVENOUS | Status: DC
  Administered 2015-03-05: 09:00:00 via INTRAVENOUS

## 2015-03-05 MED ORDER — HYDROCODONE-ACETAMINOPHEN 7.5-325 MG/15ML PO SOLN: 10.0000 mL | Freq: Four times a day (QID) | ORAL | Status: DC | PRN

## 2015-03-05 MED ORDER — ONDANSETRON HCL 4 MG PO TABS: 4.0000 mg | ORAL_TABLET | Freq: Three times a day (TID) | ORAL | Status: DC | PRN

## 2015-03-05 MED ORDER — ONDANSETRON HCL 4 MG/2ML IJ SOLN: 4.0000 mg | Freq: Once | INTRAMUSCULAR | Status: DC | PRN

## 2015-03-05 MED ORDER — FENTANYL CITRATE (PF) 250 MCG/5ML IJ SOLN
INTRAMUSCULAR | Status: DC | PRN
  Administered 2015-03-05 (×2): 50 ug via INTRAVENOUS

## 2015-03-05 MED ORDER — FENTANYL CITRATE (PF) 100 MCG/2ML IJ SOLN: 25.0000 ug | INTRAMUSCULAR | Status: DC | PRN

## 2015-03-05 MED ORDER — ROCURONIUM BROMIDE 100 MG/10ML IV SOLN
INTRAVENOUS | Status: DC | PRN
  Administered 2015-03-05: 10 mg via INTRAVENOUS

## 2015-03-05 MED ORDER — DEXAMETHASONE SODIUM PHOSPHATE 10 MG/ML IJ SOLN
INTRAMUSCULAR | Status: DC | PRN
  Administered 2015-03-05: 10 mg via INTRAVENOUS

## 2015-03-05 MED ORDER — SUCCINYLCHOLINE CHLORIDE 20 MG/ML IJ SOLN
INTRAMUSCULAR | Status: DC | PRN
  Administered 2015-03-05: 100 mg via INTRAVENOUS

## 2015-03-05 MED ORDER — OXYMETAZOLINE HCL 0.05 % NA SOLN
NASAL | Status: AC
  Filled 2015-03-05: qty 15

## 2015-03-05 MED ORDER — EPINEPHRINE HCL 1 MG/ML IJ SOLN
INTRAMUSCULAR | Status: AC
  Filled 2015-03-05: qty 1

## 2015-03-05 MED ORDER — PROPOFOL 10 MG/ML IV BOLUS
INTRAVENOUS | Status: DC | PRN
  Administered 2015-03-05: 150 mg via INTRAVENOUS

## 2015-03-05 MED ORDER — LIDOCAINE HCL (PF) 4 % IJ SOLN
INTRAMUSCULAR | Status: AC
  Filled 2015-03-05: qty 5

## 2015-03-05 MED ORDER — LIDOCAINE HCL (CARDIAC) 20 MG/ML IV SOLN
INTRAVENOUS | Status: DC | PRN
  Administered 2015-03-05: 60 mg via INTRAVENOUS

## 2015-03-05 SURGICAL SUPPLY — 14 items
CUP MEDICINE 2OZ PLAST GRAD ST (MISCELLANEOUS) ×2 IMPLANT
DRAPE TABLE BACK 80X90 (DRAPES) ×2 IMPLANT
DRESSING TELFA 4X3 1S ST N-ADH (GAUZE/BANDAGES/DRESSINGS) ×2 IMPLANT
GLOVE BIO SURGEON STRL SZ7.5 (GLOVE) ×2 IMPLANT
GOWN STRL REUS W/ TWL LRG LVL3 (GOWN DISPOSABLE) ×1 IMPLANT
GOWN STRL REUS W/TWL LRG LVL3 (GOWN DISPOSABLE) ×2
KIT PROLARN GEL W/NDL (Miscellaneous) ×2 IMPLANT
NDL SAFETY 18GX1.5 (NEEDLE) ×2 IMPLANT
PATTIES SURGICAL .5 X.5 (GAUZE/BANDAGES/DRESSINGS) ×2 IMPLANT
SOL ANTI-FOG 6CC FOG-OUT (MISCELLANEOUS) ×1 IMPLANT
SOL FOG-OUT ANTI-FOG 6CC (MISCELLANEOUS) ×1
SPONGE XRAY 4X4 16PLY STRL (MISCELLANEOUS) ×2 IMPLANT
TOWEL OR 17X26 4PK STRL BLUE (TOWEL DISPOSABLE) ×2 IMPLANT
TUBING CONNECTING 10 (TUBING) ×2 IMPLANT

## 2015-03-05 NOTE — Anesthesia Postprocedure Evaluation (Signed)
Anesthesia Post Note  Patient: Jerry English  Procedure(s) Performed: Procedure(s) (LRB): MICROLARYNGOSCOPY WITH VOCAL CORD INJECTION (N/A)  Patient location during evaluation: PACU Anesthesia Type: General Level of consciousness: awake and alert Pain management: satisfactory to patient Vital Signs Assessment: post-procedure vital signs reviewed and stable Respiratory status: respiratory function stable Cardiovascular status: stable Anesthetic complications: no    Last Vitals:  Filed Vitals:   03/05/15 1045 03/05/15 1050  BP: 136/87   Pulse: 59 59  Temp: 36.3 C   Resp: 17 16    Last Pain: There were no vitals filed for this visit.               VAN STAVEREN,Jerry English

## 2015-03-05 NOTE — Discharge Instructions (Signed)
AMBULATORY SURGERY  DISCHARGE INSTRUCTIONS   1) The drugs that you were given will stay in your system until tomorrow so for the next 24 hours you should not:  A) Drive an automobile B) Make any legal decisions C) Drink any alcoholic beverage   2) You may resume regular meals tomorrow.  Today it is better to start with liquids and gradually work up to solid foods.  You may eat anything you prefer, but it is better to start with liquids, then soup and crackers, and gradually work up to solid foods.   3) Please notify your doctor immediately if you have any unusual bleeding, trouble breathing, redness and pain at the surgery site, drainage, fever, or pain not relieved by medication.    4) Additional Instructions: NO talking x three days  Please contact your physician with any problems or Same Day Surgery at [phone], Monday through Friday 6 am to 4 pm, or [place] [facility] number at [phone].

## 2015-03-05 NOTE — Anesthesia Preprocedure Evaluation (Signed)
Anesthesia Evaluation  Patient identified by MRN, date of birth, ID band Patient awake    Reviewed: Allergy & Precautions, NPO status , Patient's Chart, lab work & pertinent test results  Airway Mallampati: III       Dental no notable dental hx.    Pulmonary neg pulmonary ROS,    breath sounds clear to auscultation       Cardiovascular Exercise Tolerance: Good hypertension, Pt. on medications + Past MI  + pacemaker + Cardiac Defibrillator  Rhythm:Regular Rate:Normal     Neuro/Psych    GI/Hepatic negative GI ROS, Neg liver ROS,   Endo/Other  negative endocrine ROS  Renal/GU negative Renal ROS     Musculoskeletal   Abdominal (+) + obese,   Peds  Hematology negative hematology ROS (+)   Anesthesia Other Findings   Reproductive/Obstetrics                             Anesthesia Physical Anesthesia Plan  ASA: III  Anesthesia Plan: General   Post-op Pain Management:    Induction: Intravenous  Airway Management Planned: Oral ETT  Additional Equipment:   Intra-op Plan:   Post-operative Plan: Extubation in OR  Informed Consent: I have reviewed the patients History and Physical, chart, labs and discussed the procedure including the risks, benefits and alternatives for the proposed anesthesia with the patient or authorized representative who has indicated his/her understanding and acceptance.     Plan Discussed with: CRNA  Anesthesia Plan Comments:         Anesthesia Quick Evaluation

## 2015-03-05 NOTE — Op Note (Signed)
....  03/05/2015  9:51 AM    Fabrizio, Jori Moll  [id number]   Pre-Op Dx:  Dysphonia, Presbylaryngeus  Post-op Dx: same  Proc: Suspension Microlaryngoscopy with injection laryngoplasty with Prolaryn Voice GEL  Surg:  Mckinnley Smithey  Anes:  GOT  EBL:  0  Comp:  None  Findings:  Bilateral vocal fold bowing left greater than right, sulcus of left true vocal fold in mid cord with scaring of the epithelium, 0.5cc injection in left true vocal fold and 0.1cc injection in right true vocal fold  Procedure: After the patient was identified in holding and the history and physical and consent was reviewed, the patient was taken to the operating room and placed in a supine position. General endotracheal anesthesia was induced in the normal fashion with a small MLT.  At this time, the patient was rotated 90 degrees and a shoulder roll was placed as well as well as a mouth guard.   At this time,Dedo laryngoscope was inserted into the patient's oral cavity. Visualization of the oral cavity, oropharynx, pharynx and larynx was made.  Limited anterior view was available and therefore the anterior commisure laryngoscope was used for better visualization. Under high power magnification, using a Hopkins rod, the larynx and vocal fold were evaluated and palpate with findings described above.  The patient's left true vocal fold revealed moderate bowing and the right mild.  There was a sulcus in the mid vocal fold on the patient's left side.  At this time, Prolaryn voice GEL was injected just lateral to the vocal ligament bilaterally at the proper depth.  This demonstrated good movement on the left above and below the sulcus but some limited improved in the area of the scar.  One the patient's right side, the injection was preformed with 0.1cc of Prolayrn voice gel.  The patient's airway remained widely patent, but with more bulk to the his vocal fold, left greater than right.  The patient's entire airway was next  evaluated for hemostasis and meticulous suctioning was performed. 0.1ccs of 4% lidocaine was sprayed onto the epiglottis and larynx. The patient was released from suspension and the mouth gag and laryngoscope was removed from the patient's oral cavity without injury to teeth, lips, or gums.   Dispo:   To PACU in good condition  Plan:  Soft Diet, voice rest x 3 days.  Follow up in 1 week for repeat evaluation.Marland Kitchen  Loney Peto  03/05/2015 9:51 AM

## 2015-03-05 NOTE — Transfer of Care (Signed)
Immediate Anesthesia Transfer of Care Note  Patient: Sidharth Sharum Funderburke  Procedure(s) Performed: Procedure(s): MICROLARYNGOSCOPY WITH VOCAL CORD INJECTION (N/A)  Patient Location: PACU  Anesthesia Type:General  Level of Consciousness: awake, alert  and oriented  Airway & Oxygen Therapy: Patient Spontanous Breathing and Patient connected to face mask oxygen  Post-op Assessment: Report given to RN and Post -op Vital signs reviewed and stable  Post vital signs: Reviewed and stable  Last Vitals: 10:07 100% sat 60 hr pacing 134/83 18 resp 97.8 temp Filed Vitals:   03/05/15 0823 03/05/15 0850  BP: 138/99 127/83  Pulse: 78   Temp: 35.8 C   Resp: 16     Complications: No apparent anesthesia complications

## 2015-03-05 NOTE — Anesthesia Procedure Notes (Signed)
Procedure Name: Intubation Date/Time: 03/05/2015 9:26 AM Performed by: Delaney Meigs Pre-anesthesia Checklist: Patient identified, Emergency Drugs available, Suction available, Patient being monitored and Timeout performed Patient Re-evaluated:Patient Re-evaluated prior to inductionOxygen Delivery Method: Circle system utilized Preoxygenation: Pre-oxygenation with 100% oxygen Intubation Type: IV induction Ventilation: Mask ventilation without difficulty Laryngoscope Size: Mac and 3 Grade View: Grade III Tube type: MLT Tube size: 6.0 mm Number of attempts: 1 Airway Equipment and Method: Stylet Placement Confirmation: ETT inserted through vocal cords under direct vision,  positive ETCO2 and breath sounds checked- equal and bilateral Secured at: 23 cm Tube secured with: Tape Dental Injury: Teeth and Oropharynx as per pre-operative assessment

## 2015-03-05 NOTE — H&P (Signed)
..  History and Physical paper copy reviewed and updated date of procedure and will be scanned into system.  

## 2016-02-24 ENCOUNTER — Observation Stay
Admission: EM | Admit: 2016-02-24 | Discharge: 2016-02-26 | Disposition: A | Discharge status: Home / Self Care | Payer: Medicare Other | Attending: Internal Medicine | Admitting: Internal Medicine

## 2016-02-24 ENCOUNTER — Emergency Department: Payer: Medicare Other

## 2016-02-24 DIAGNOSIS — R748 Abnormal levels of other serum enzymes: Secondary | ICD-10-CM | POA: Diagnosis present

## 2016-02-24 DIAGNOSIS — Z8249 Family history of ischemic heart disease and other diseases of the circulatory system: Secondary | ICD-10-CM | POA: Diagnosis not present

## 2016-02-24 DIAGNOSIS — R072 Precordial pain: Secondary | ICD-10-CM | POA: Diagnosis present

## 2016-02-24 DIAGNOSIS — I429 Cardiomyopathy, unspecified: Secondary | ICD-10-CM | POA: Insufficient documentation

## 2016-02-24 DIAGNOSIS — I25110 Atherosclerotic heart disease of native coronary artery with unstable angina pectoris: Secondary | ICD-10-CM | POA: Diagnosis not present

## 2016-02-24 DIAGNOSIS — Z955 Presence of coronary angioplasty implant and graft: Secondary | ICD-10-CM | POA: Diagnosis not present

## 2016-02-24 DIAGNOSIS — I130 Hypertensive heart and chronic kidney disease with heart failure and stage 1 through stage 4 chronic kidney disease, or unspecified chronic kidney disease: Secondary | ICD-10-CM | POA: Insufficient documentation

## 2016-02-24 DIAGNOSIS — Z96652 Presence of left artificial knee joint: Secondary | ICD-10-CM | POA: Diagnosis not present

## 2016-02-24 DIAGNOSIS — Z7982 Long term (current) use of aspirin: Secondary | ICD-10-CM | POA: Diagnosis not present

## 2016-02-24 DIAGNOSIS — Z79899 Other long term (current) drug therapy: Secondary | ICD-10-CM | POA: Insufficient documentation

## 2016-02-24 DIAGNOSIS — I200 Unstable angina: Secondary | ICD-10-CM | POA: Diagnosis present

## 2016-02-24 DIAGNOSIS — N183 Chronic kidney disease, stage 3 (moderate): Secondary | ICD-10-CM | POA: Diagnosis not present

## 2016-02-24 DIAGNOSIS — I252 Old myocardial infarction: Secondary | ICD-10-CM | POA: Insufficient documentation

## 2016-02-24 DIAGNOSIS — I5022 Chronic systolic (congestive) heart failure: Secondary | ICD-10-CM | POA: Diagnosis not present

## 2016-02-24 DIAGNOSIS — I2510 Atherosclerotic heart disease of native coronary artery without angina pectoris: Secondary | ICD-10-CM | POA: Diagnosis present

## 2016-02-24 DIAGNOSIS — Z888 Allergy status to other drugs, medicaments and biological substances status: Secondary | ICD-10-CM | POA: Insufficient documentation

## 2016-02-24 DIAGNOSIS — H409 Unspecified glaucoma: Secondary | ICD-10-CM | POA: Insufficient documentation

## 2016-02-24 DIAGNOSIS — R778 Other specified abnormalities of plasma proteins: Secondary | ICD-10-CM

## 2016-02-24 DIAGNOSIS — R569 Unspecified convulsions: Secondary | ICD-10-CM | POA: Diagnosis not present

## 2016-02-24 DIAGNOSIS — Z95810 Presence of automatic (implantable) cardiac defibrillator: Secondary | ICD-10-CM | POA: Insufficient documentation

## 2016-02-24 DIAGNOSIS — I10 Essential (primary) hypertension: Secondary | ICD-10-CM | POA: Diagnosis present

## 2016-02-24 DIAGNOSIS — I081 Rheumatic disorders of both mitral and tricuspid valves: Secondary | ICD-10-CM | POA: Insufficient documentation

## 2016-02-24 DIAGNOSIS — R7989 Other specified abnormal findings of blood chemistry: Secondary | ICD-10-CM

## 2016-02-24 HISTORY — DX: Heart failure, unspecified: I50.9

## 2016-02-24 LAB — CBC
HCT: 44.6 % (ref 40.0–52.0)
HEMOGLOBIN: 15.1 g/dL (ref 13.0–18.0)
MCH: 34.3 pg — AB (ref 26.0–34.0)
MCHC: 34 g/dL (ref 32.0–36.0)
MCV: 101.1 fL — ABNORMAL HIGH (ref 80.0–100.0)
Platelets: 145 10*3/uL — ABNORMAL LOW (ref 150–440)
RBC: 4.41 MIL/uL (ref 4.40–5.90)
RDW: 15 % — AB (ref 11.5–14.5)
WBC: 7.1 10*3/uL (ref 3.8–10.6)

## 2016-02-24 LAB — BASIC METABOLIC PANEL
ANION GAP: 6 (ref 5–15)
BUN: 23 mg/dL — AB (ref 6–20)
CALCIUM: 9.1 mg/dL (ref 8.9–10.3)
CO2: 28 mmol/L (ref 22–32)
CREATININE: 1.82 mg/dL — AB (ref 0.61–1.24)
Chloride: 105 mmol/L (ref 101–111)
GFR calc Af Amer: 41 mL/min — ABNORMAL LOW (ref >60)
GFR, EST NON AFRICAN AMERICAN: 35 mL/min — AB (ref >60)
GLUCOSE: 99 mg/dL (ref 65–99)
Potassium: 4.6 mmol/L (ref 3.5–5.1)
Sodium: 139 mmol/L (ref 135–145)

## 2016-02-24 LAB — TROPONIN I
TROPONIN I: 0.06 ng/mL — AB (ref <0.03)
TROPONIN I: 0.05 ng/mL — AB (ref <0.03)

## 2016-02-24 MED ORDER — ASPIRIN 81 MG PO CHEW
162.0000 mg | CHEWABLE_TABLET | Freq: Once | ORAL | Status: AC
  Administered 2016-02-24: 162 mg via ORAL
  Filled 2016-02-24: qty 2

## 2016-02-24 NOTE — H&P (Signed)
[place] [facility] [place] NAME: Jerry English    MR#:  [id number]  DATE OF BIRTH:  1940-12-27  DATE OF ADMISSION:  02/24/2016  PRIMARY CARE PHYSICIAN: Leonel Ramsay, MD   REQUESTING/REFERRING PHYSICIAN: Joni Fears, MD  CHIEF COMPLAINT:   Chief Complaint  Patient presents with  . Shortness of Breath    HISTORY OF PRESENT ILLNESS:  Jerry English  is a 75 y.o. male who presents with Chest pain and dyspnea on exertion. Patient has a known history of CAD with prior stent placement and prior history of heart attack. He is also status post defibrillator/pacemaker placement. He had an episode of exertional chest pain 2 days ago which improved with rest. However, since that time he has had persistent dyspnea on exertion and worsening fatigue.  In the ED tonight his troponin was mildly elevated, and she had some new T-wave inversions on his EKG. Hospitalists were called for further evaluation  PAST MEDICAL HISTORY:   Past Medical History:  Diagnosis Date  . CHF (congestive heart failure) ([place])   . Glaucoma   . Hypertension   . Myocardial infarction   . Presence of combination internal cardiac defibrillator (ICD) and pacemaker   . Presence of permanent cardiac pacemaker   . Seizures ([place])     PAST SURGICAL HISTORY:   Past Surgical History:  Procedure Laterality Date  . BRAIN SURGERY    . CARDIAC CATHETERIZATION    . COLONOSCOPY WITH PROPOFOL N/A 10/02/2014   Procedure: COLONOSCOPY WITH PROPOFOL;  Surgeon: Hulen Luster, MD;  Location: [facility] ENDOSCOPY;  Service: Gastroenterology;  Laterality: N/A;  . ESOPHAGOGASTRODUODENOSCOPY (EGD) WITH PROPOFOL N/A 10/02/2014   Procedure: ESOPHAGOGASTRODUODENOSCOPY (EGD) WITH PROPOFOL;  Surgeon: Hulen Luster, MD;  Location: [facility] ENDOSCOPY;  Service: Gastroenterology;  Laterality: N/A;  . INSERT / REPLACE / REMOVE PACEMAKER    . JOINT REPLACEMENT     left knee  . MICROLARYNGOSCOPY W/VOCAL CORD INJECTION N/A  03/05/2015   Procedure: MICROLARYNGOSCOPY WITH VOCAL CORD INJECTION;  Surgeon: Carloyn Manner, MD;  Location: [facility] ORS;  Service: ENT;  Laterality: N/A;    SOCIAL HISTORY:   Social History  Substance Use Topics  . Smoking status: Never Smoker  . Smokeless tobacco: Never Used  . Alcohol use No    FAMILY HISTORY:   Family History  Problem Relation Age of Onset  . Heart attack Mother   . Heart attack Father   . Heart attack Maternal Grandfather   . Heart attack Paternal Grandfather     DRUG ALLERGIES:  No Known Allergies  MEDICATIONS AT HOME:   Prior to Admission medications   Medication Sig Start Date End Date Taking? Authorizing Provider  amiodarone (PACERONE) 200 MG tablet Take 200 mg by mouth every morning.    Historical Provider, MD  aspirin EC 81 MG tablet Take 162 mg by mouth every morning.    Historical Provider, MD  dorzolamide (TRUSOPT) 2 % ophthalmic solution Place into both eyes 2 (two) times daily.    Historical Provider, MD  furosemide (LASIX) 20 MG tablet Take 20 mg by mouth every morning.    Historical Provider, MD  HYDROcodone-acetaminophen (HYCET) 7.5-325 mg/15 ml solution Take 10 mLs by mouth 4 (four) times daily as needed for moderate pain. 03/05/15   Carloyn Manner, MD  latanoprost (XALATAN) 0.005 % ophthalmic solution Place 1 drop into both eyes at bedtime.    Historical Provider, MD  losartan (COZAAR) 50 MG tablet Take 50 mg  by mouth every morning.    Historical Provider, MD  ondansetron (ZOFRAN) 4 MG tablet Take 1 tablet (4 mg total) by mouth every 8 (eight) hours as needed for nausea or vomiting. 03/05/15   Carloyn Manner, MD  pantoprazole (PROTONIX) 40 MG tablet Take 40 mg by mouth daily.    Historical Provider, MD  simvastatin (ZOCOR) 40 MG tablet Take 40 mg by mouth daily at 6 PM.     Historical Provider, MD  timolol (TIMOPTIC) 0.5 % ophthalmic solution Place 1 drop into both eyes 2 (two) times daily.    Historical Provider, MD    REVIEW OF  SYSTEMS:  Review of Systems  Constitutional: Positive for malaise/fatigue. Negative for chills, fever and weight loss.  HENT: Negative for ear pain, hearing loss and tinnitus.   Eyes: Negative for blurred vision, double vision, pain and redness.  Respiratory: Positive for shortness of breath. Negative for cough and hemoptysis.   Cardiovascular: Positive for chest pain. Negative for palpitations, orthopnea and leg swelling.  Gastrointestinal: Negative for abdominal pain, constipation, diarrhea, nausea and vomiting.  Genitourinary: Negative for dysuria, frequency and hematuria.  Musculoskeletal: Negative for back pain, joint pain and neck pain.  Skin:       No acne, rash, or lesions  Neurological: Negative for dizziness, tremors, focal weakness and weakness.  Endo/Heme/Allergies: Negative for polydipsia. Does not bruise/bleed easily.  Psychiatric/Behavioral: Negative for depression. The patient is not nervous/anxious and does not have insomnia.      VITAL SIGNS:   Vitals:   02/24/16 1853 02/24/16 2100 02/24/16 2126  BP: (!) 150/87 (!) 136/91 128/87  Pulse: 62 (!) 58 60  Resp: 20  18  Temp: 98.3 F (36.8 C)    TempSrc: Oral    SpO2: 97% 95% 96%  Weight: 103.4 kg (228 lb)    Height: 6\' 1"  (1.854 m)     Wt Readings from Last 3 Encounters:  02/24/16 103.4 kg (228 lb)  03/05/15 102.5 kg (226 lb)  02/26/15 102.5 kg (226 lb)    PHYSICAL EXAMINATION:  Physical Exam  Vitals reviewed. Constitutional: He is oriented to person, place, and time. He appears well-developed and well-nourished. No distress.  HENT:  Head: Normocephalic and atraumatic.  Mouth/Throat: Oropharynx is clear and moist.  Eyes: Conjunctivae and EOM are normal. Pupils are equal, round, and reactive to light. No scleral icterus.  Neck: Normal range of motion. Neck supple. No JVD present. No thyromegaly present.  Cardiovascular: Normal rate, regular rhythm and intact distal pulses.  Exam reveals no gallop and no  friction rub.   No murmur heard. Respiratory: Effort normal and breath sounds normal. No respiratory distress. He has no wheezes. He has no rales.  GI: Soft. Bowel sounds are normal. He exhibits no distension. There is no tenderness.  Musculoskeletal: Normal range of motion. He exhibits no edema.  No arthritis, no gout  Lymphadenopathy:    He has no cervical adenopathy.  Neurological: He is alert and oriented to person, place, and time. No cranial nerve deficit.  No dysarthria, no aphasia  Skin: Skin is warm and dry. No rash noted. No erythema.  Psychiatric: He has a normal mood and affect. His behavior is normal. Judgment and thought content normal.    LABORATORY PANEL:   CBC  Recent Labs Lab 02/24/16 1855  WBC 7.1  HGB 15.1  HCT 44.6  PLT 145*   ------------------------------------------------------------------------------------------------------------------  Chemistries   Recent Labs Lab 02/24/16 1855  NA 139  K 4.6  CL  105  CO2 28  GLUCOSE 99  BUN 23*  CREATININE 1.82*  CALCIUM 9.1   ------------------------------------------------------------------------------------------------------------------  Cardiac Enzymes  Recent Labs Lab 02/24/16 1855  TROPONINI 0.06*   ------------------------------------------------------------------------------------------------------------------  RADIOLOGY:  Dg Chest 2 View  Result Date: 02/24/2016 CLINICAL DATA:  Shortness of Breath EXAM: CHEST  2 VIEW COMPARISON:  08/09/2014 FINDINGS: Cardiac shadow is within normal limits. A defibrillator is again seen. Lungs are clear bilaterally. No acute bony abnormality is seen. IMPRESSION: No active cardiopulmonary disease. Electronically Signed   By: Inez Catalina M.D.   On: 02/24/2016 19:28    EKG:   Orders placed or performed during the hospital encounter of 02/24/16  . ED EKG within 10 minutes  . ED EKG within 10 minutes  . EKG 12-Lead  . EKG 12-Lead    IMPRESSION AND  PLAN:  Principal Problem:   Unstable angina ([place]) - patient is currently chest pain-free, that seems like he had an episode 2 days ago which could very well have been a heart attack. We will trend his cardiac enzymes tonight, get an echocardiogram and cardiology consult in the morning. Active Problems:   HTN (hypertension) - stable, continue home meds   Chronic systolic CHF (congestive heart failure) (HCC) - does not seem to be in acute exacerbation, continue home meds   CAD (coronary artery disease) - continue home meds, other workup as above  All the records are reviewed and case discussed with ED provider. Management plans discussed with the patient and/or family.  DVT PROPHYLAXIS: SubQ heparin  GI PROPHYLAXIS: PPI  ADMISSION STATUS: Observation  CODE STATUS: Full Code Status History    This patient does not have a recorded code status. Please follow your organizational policy for patients in this situation.      TOTAL TIME TAKING CARE OF THIS PATIENT: 40 minutes.    Jaysiah Marchetta FIELDING 02/24/2016, 10:37 PM  [facility]  Office  [phone]  CC: Primary care physician; Leonel Ramsay, MD

## 2016-02-24 NOTE — ED Triage Notes (Addendum)
Pt c/o scratchy throat and SOB since Monday. Pt is not usually SOB. Pt denies cough or congestion. Pt alert and oriented X4, active, cooperative, pt in NAD. Reports intermittent and mild CP, denies at this time.

## 2016-02-24 NOTE — ED Provider Notes (Signed)
[facility] Emergency Department Provider Note  ____________________________________________  Time seen: Approximately 9:45 PM  I have reviewed the triage vital signs and the nursing notes.   HISTORY  Chief Complaint Shortness of Breath    HPI Jerry English is a 75 y.o. male who complains of intermittent chest throbbing pain associated with shortness of breath for the past 2 days. It occurs and worsens whenever he walks, gets better with rest. Nonradiating. No diaphoresis or vomiting. Currently no chest pain or shortness of breath. Has a history of CAD, coronary artery stenting, implanted pacemaker defibrillator. Follows Dr. Ubaldo Glassing.     Past Medical History:  Diagnosis Date  . CHF (congestive heart failure) ([place])   . Glaucoma   . Hypertension   . Myocardial infarction   . Presence of combination internal cardiac defibrillator (ICD) and pacemaker   . Presence of permanent cardiac pacemaker   . Seizures ([place])      There are no active problems to display for this patient.    Past Surgical History:  Procedure Laterality Date  . BRAIN SURGERY    . CARDIAC CATHETERIZATION    . COLONOSCOPY WITH PROPOFOL N/A 10/02/2014   Procedure: COLONOSCOPY WITH PROPOFOL;  Surgeon: Hulen Luster, MD;  Location: [facility] ENDOSCOPY;  Service: Gastroenterology;  Laterality: N/A;  . ESOPHAGOGASTRODUODENOSCOPY (EGD) WITH PROPOFOL N/A 10/02/2014   Procedure: ESOPHAGOGASTRODUODENOSCOPY (EGD) WITH PROPOFOL;  Surgeon: Hulen Luster, MD;  Location[facility] END[facility]COPY;  Service: Gastroenterology;  Laterality: N/A;  . INSERT / REPLACE / REMOVE PACEMAKER    . JOINT REPLACEMENT     left knee  . MICROLARYNGOSCOPY W/VOCAL CORD INJECTION N/A 03/05/2015   Procedure: MICROLARYNGOSCOPY WITH VOCAL CORD INJECTION;  Surgeon: Carloyn Manner, MD;  Location: [facility] ORS;  Service: ENT;  Laterality: N/A;     Prior to Admission medications   Medication Sig Start Date End Date Taking? Authorizing Provider   amiodarone (PACERONE) 200 MG tablet Take 200 mg by mouth every morning.    Historical Provider, MD  aspirin EC 81 MG tablet Take 162 mg by mouth every morning.    Historical Provider, MD  dorzolamide (TRUSOPT) 2 % ophthalmic solution Place into both eyes 2 (two) times daily.    Historical Provider, MD  furosemide (LASIX) 20 MG tablet Take 20 mg by mouth every morning.    Historical Provider, MD  HYDROcodone-acetaminophen (HYCET) 7.5-325 mg/15 ml solution Take 10 mLs by mouth 4 (four) times daily as needed for moderate pain. 03/05/15   Carloyn Manner, MD  latanoprost (XALATAN) 0.005 % ophthalmic solution Place 1 drop into both eyes at bedtime.    Historical Provider, MD  losartan (COZAAR) 50 MG tablet Take 50 mg by mouth every morning.    Historical Provider, MD  ondansetron (ZOFRAN) 4 MG tablet Take 1 tablet (4 mg total) by mouth every 8 (eight) hours as needed for nausea or vomiting. 03/05/15   Carloyn Manner, MD  pantoprazole (PROTONIX) 40 MG tablet Take 40 mg by mouth daily.    Historical Provider, MD  simvastatin (ZOCOR) 40 MG tablet Take 40 mg by mouth daily at 6 PM.     Historical Provider, MD  timolol (TIMOPTIC) 0.5 % ophthalmic solution Place 1 drop into both eyes 2 (two) times daily.    Historical Provider, MD     Allergies Patient has no known allergies.   No family history on file.  Social History Social History  Substance Use Topics  . Smoking status: Never Smoker  . Smokeless  tobacco: Never Used  . Alcohol use No    Review of Systems  Constitutional:   No fever or chills.  ENT:   No sore throat. No rhinorrhea. Cardiovascular:   Positive as above chest pain. Respiratory:   Shortness of breath with chest pain as above.. Gastrointestinal:   Negative for abdominal pain, vomiting and diarrhea.  Genitourinary:   Negative for dysuria or difficulty urinating. Musculoskeletal:   Negative for focal pain or swelling Neurological:   Negative for headaches 10-point ROS  otherwise negative.  ____________________________________________   PHYSICAL EXAM:  VITAL SIGNS: ED Triage Vitals [02/24/16 1853]  Enc Vitals Group     BP (!) 150/87     Pulse Rate 62     Resp 20     Temp 98.3 F (36.8 C)     Temp Source Oral     SpO2 97 %     Weight 228 lb (103.4 kg)     Height 6\' 1"  (1.854 m)     Head Circumference      Peak Flow      Pain Score      Pain Loc      Pain Edu?      Excl. in [place]?     Vital signs reviewed, nursing assessments reviewed.   Constitutional:   Alert and oriented. Well appearing and in no distress. Eyes:   No scleral icterus. No conjunctival pallor. PERRL. EOMI.  No nystagmus. ENT   Head:   Normocephalic and atraumatic.   Nose:   No congestion/rhinnorhea. No septal hematoma   Mouth/Throat:   MMM, no pharyngeal erythema. No peritonsillar mass.    Neck:   No stridor. No SubQ emphysema. No meningismus. Hematological/Lymphatic/Immunilogical:   No cervical lymphadenopathy. Cardiovascular:   RRR. Symmetric bilateral radial and DP pulses.  No murmurs.  Respiratory:   Normal respiratory effort without tachypnea nor retractions. Breath sounds are clear and equal bilaterally. No wheezes/rales/rhonchi. Gastrointestinal:   Soft and nontender. Non distended. There is no CVA tenderness.  No rebound, rigidity, or guarding. Genitourinary:   deferred Musculoskeletal:   Nontender with normal range of motion in all extremities. No joint effusions.  No lower extremity tenderness.  No edema. Neurologic:   Normal speech and language.  CN 2-10 normal. Motor grossly intact. No gross focal neurologic deficits are appreciated.  Skin:    Skin is warm, dry and intact. No rash noted.  No petechiae, purpura, or bullae.  ____________________________________________    LABS (pertinent positives/negatives) (all labs ordered are listed, but only abnormal results are displayed) Labs Reviewed  BASIC METABOLIC PANEL - Abnormal; Notable for the  following:       Result Value   BUN 23 (*)    Creatinine, Ser 1.82 (*)    GFR calc non Af Amer 35 (*)    GFR calc Af Amer 41 (*)    All other components within normal limits  CBC - Abnormal; Notable for the following:    MCV 101.1 (*)    MCH 34.3 (*)    RDW 15.0 (*)    Platelets 145 (*)    All other components within normal limits  TROPONIN I - Abnormal; Notable for the following:    Troponin I 0.06 (*)    All other components within normal limits  TROPONIN I   ____________________________________________   EKG  Interpreted by me Dual-chamber paced. Normal axis. Left bundle branch block. No acute ischemic changes. Rate of 63.  ____________________________________________    RADIOLOGY  Chest x-ray unremarkable  ____________________________________________   PROCEDURES Procedures  ____________________________________________   INITIAL IMPRESSION / ASSESSMENT AND PLAN / ED COURSE  Pertinent labs & imaging results that were available during my care of the patient were reviewed by me and considered in my medical decision making (see chart for details).  Patient presents with exertional chest pain associated with slightly elevated troponin. EKG is limited due to underlying pacemaker, but nonischemic. Currently symptom free, but with significant risk factors and concerning history of present illness, give the patient additional aspirin to bring him up to 324 total daily dose, and case discussed with hospitalist for further evaluation. Symptoms not currently consistent with unstable angina, would not anticoagulate at this time. Low suspicion for PE dissection or carditis.     Clinical Course    ____________________________________________   FINAL CLINICAL IMPRESSION(S) / ED DIAGNOSES  Final diagnoses:  Precordial chest pain  Elevated troponin       Portions of this note were generated with dragon dictation software. Dictation errors may occur despite best  attempts at proofreading.    Carrie Mew, MD 02/24/16 2219

## 2016-02-25 ENCOUNTER — Observation Stay
Admit: 2016-02-25 | Discharge: 2016-02-25 | Disposition: A | Discharge status: Home / Self Care | Payer: Medicare Other | Attending: Internal Medicine | Admitting: Internal Medicine

## 2016-02-25 ENCOUNTER — Encounter
Admission: EM | Disposition: A | Discharge status: Home / Self Care | Payer: Self-pay | Source: Home / Self Care | Attending: Emergency Medicine

## 2016-02-25 DIAGNOSIS — I25110 Atherosclerotic heart disease of native coronary artery with unstable angina pectoris: Secondary | ICD-10-CM | POA: Diagnosis not present

## 2016-02-25 HISTORY — PX: CARDIAC CATHETERIZATION: SHX172

## 2016-02-25 LAB — BASIC METABOLIC PANEL
Anion gap: 6 (ref 5–15)
BUN: 21 mg/dL — AB (ref 6–20)
CHLORIDE: 107 mmol/L (ref 101–111)
CO2: 27 mmol/L (ref 22–32)
CREATININE: 1.55 mg/dL — AB (ref 0.61–1.24)
Calcium: 8.5 mg/dL — ABNORMAL LOW (ref 8.9–10.3)
GFR calc Af Amer: 50 mL/min — ABNORMAL LOW (ref >60)
GFR calc non Af Amer: 43 mL/min — ABNORMAL LOW (ref >60)
Glucose, Bld: 87 mg/dL (ref 65–99)
Potassium: 3.9 mmol/L (ref 3.5–5.1)
SODIUM: 140 mmol/L (ref 135–145)

## 2016-02-25 LAB — CBC
HCT: 40.6 % (ref 40.0–52.0)
Hemoglobin: 13.6 g/dL (ref 13.0–18.0)
MCH: 33.3 pg (ref 26.0–34.0)
MCHC: 33.6 g/dL (ref 32.0–36.0)
MCV: 99 fL (ref 80.0–100.0)
PLATELETS: 122 10*3/uL — AB (ref 150–440)
RBC: 4.1 MIL/uL — ABNORMAL LOW (ref 4.40–5.90)
RDW: 14.9 % — AB (ref 11.5–14.5)
WBC: 5.7 10*3/uL (ref 3.8–10.6)

## 2016-02-25 LAB — TROPONIN I
Troponin I: 0.05 ng/mL (ref <0.03)
Troponin I: 0.04 ng/mL (ref <0.03)
Troponin I: 0.04 ng/mL (ref <0.03)

## 2016-02-25 LAB — ECHOCARDIOGRAM COMPLETE
Height: 73 in
Weight: 3568 oz

## 2016-02-25 SURGERY — LEFT HEART CATH AND CORONARY ANGIOGRAPHY
Anesthesia: Moderate Sedation

## 2016-02-25 MED ORDER — PANTOPRAZOLE SODIUM 40 MG PO TBEC
40.0000 mg | DELAYED_RELEASE_TABLET | Freq: Every day | ORAL | Status: DC
  Administered 2016-02-25 – 2016-02-26 (×2): 40 mg via ORAL
  Filled 2016-02-25 (×2): qty 1

## 2016-02-25 MED ORDER — FENTANYL CITRATE (PF) 100 MCG/2ML IJ SOLN
INTRAMUSCULAR | Status: AC
  Filled 2016-02-25: qty 2

## 2016-02-25 MED ORDER — ATORVASTATIN CALCIUM 20 MG PO TABS
20.0000 mg | ORAL_TABLET | Freq: Every day | ORAL | Status: DC
  Administered 2016-02-25: 20 mg via ORAL
  Filled 2016-02-25: qty 1

## 2016-02-25 MED ORDER — SODIUM CHLORIDE 0.9 % IV SOLN: 250.0000 mL | INTRAVENOUS | Status: DC | PRN

## 2016-02-25 MED ORDER — ASPIRIN 81 MG PO CHEW
81.0000 mg | CHEWABLE_TABLET | ORAL | Status: AC
  Administered 2016-02-25: 81 mg via ORAL

## 2016-02-25 MED ORDER — HEPARIN SODIUM (PORCINE) 5000 UNIT/ML IJ SOLN
5000.0000 [IU] | Freq: Three times a day (TID) | INTRAMUSCULAR | Status: DC
  Administered 2016-02-25 (×2): 5000 [IU] via SUBCUTANEOUS
  Filled 2016-02-25 (×3): qty 1

## 2016-02-25 MED ORDER — SIMVASTATIN 40 MG PO TABS: 40.0000 mg | ORAL_TABLET | Freq: Every day | ORAL | Status: DC

## 2016-02-25 MED ORDER — HEPARIN (PORCINE) IN NACL 2-0.9 UNIT/ML-% IJ SOLN
INTRAMUSCULAR | Status: AC
  Filled 2016-02-25: qty 1000

## 2016-02-25 MED ORDER — MIDAZOLAM HCL 2 MG/2ML IJ SOLN
INTRAMUSCULAR | Status: AC
  Filled 2016-02-25: qty 2

## 2016-02-25 MED ORDER — ONDANSETRON HCL 4 MG PO TABS: 4.0000 mg | ORAL_TABLET | Freq: Four times a day (QID) | ORAL | Status: DC | PRN

## 2016-02-25 MED ORDER — FUROSEMIDE 20 MG PO TABS
20.0000 mg | ORAL_TABLET | ORAL | Status: DC
  Administered 2016-02-25 – 2016-02-26 (×2): 20 mg via ORAL
  Filled 2016-02-25 (×2): qty 1

## 2016-02-25 MED ORDER — SODIUM CHLORIDE 0.9% FLUSH
3.0000 mL | Freq: Two times a day (BID) | INTRAVENOUS | Status: DC
  Administered 2016-02-25 – 2016-02-26 (×2): 3 mL via INTRAVENOUS

## 2016-02-25 MED ORDER — AMIODARONE HCL 200 MG PO TABS
200.0000 mg | ORAL_TABLET | ORAL | Status: DC
  Administered 2016-02-25 – 2016-02-26 (×2): 200 mg via ORAL
  Filled 2016-02-25 (×2): qty 1

## 2016-02-25 MED ORDER — MIDAZOLAM HCL 2 MG/2ML IJ SOLN
INTRAMUSCULAR | Status: DC | PRN
  Administered 2016-02-25: 1 mg via INTRAVENOUS

## 2016-02-25 MED ORDER — SODIUM CHLORIDE 0.9% FLUSH: 3.0000 mL | INTRAVENOUS | Status: DC | PRN

## 2016-02-25 MED ORDER — FENTANYL CITRATE (PF) 100 MCG/2ML IJ SOLN
INTRAMUSCULAR | Status: DC | PRN
  Administered 2016-02-25: 25 ug via INTRAVENOUS

## 2016-02-25 MED ORDER — ONDANSETRON HCL 4 MG/2ML IJ SOLN: 4.0000 mg | Freq: Four times a day (QID) | INTRAMUSCULAR | Status: DC | PRN

## 2016-02-25 MED ORDER — SODIUM CHLORIDE 0.9 % IV SOLN
INTRAVENOUS | Status: AC
  Administered 2016-02-25: 02:00:00 via INTRAVENOUS

## 2016-02-25 MED ORDER — ACETAMINOPHEN 325 MG PO TABS: 650.0000 mg | ORAL_TABLET | Freq: Four times a day (QID) | ORAL | Status: DC | PRN

## 2016-02-25 MED ORDER — ASPIRIN 81 MG PO CHEW
CHEWABLE_TABLET | ORAL | Status: AC
  Filled 2016-02-25: qty 1

## 2016-02-25 MED ORDER — SODIUM CHLORIDE 0.9% FLUSH
3.0000 mL | Freq: Two times a day (BID) | INTRAVENOUS | Status: DC
  Administered 2016-02-25: 3 mL via INTRAVENOUS

## 2016-02-25 MED ORDER — ACETAMINOPHEN 650 MG RE SUPP: 650.0000 mg | Freq: Four times a day (QID) | RECTAL | Status: DC | PRN

## 2016-02-25 MED ORDER — FUROSEMIDE 10 MG/ML IJ SOLN
20.0000 mg | Freq: Once | INTRAMUSCULAR | Status: AC
  Administered 2016-02-25: 20 mg via INTRAVENOUS
  Filled 2016-02-25: qty 2

## 2016-02-25 MED ORDER — ASPIRIN EC 81 MG PO TBEC
162.0000 mg | DELAYED_RELEASE_TABLET | ORAL | Status: DC
  Administered 2016-02-25 – 2016-02-26 (×2): 162 mg via ORAL
  Filled 2016-02-25 (×2): qty 2

## 2016-02-25 MED ORDER — TIMOLOL MALEATE 0.5 % OP SOLN
1.0000 [drp] | Freq: Two times a day (BID) | OPHTHALMIC | Status: DC
  Administered 2016-02-25 – 2016-02-26 (×3): 1 [drp] via OPHTHALMIC
  Filled 2016-02-25: qty 5

## 2016-02-25 MED ORDER — LATANOPROST 0.005 % OP SOLN
1.0000 [drp] | Freq: Every day | OPHTHALMIC | Status: DC
  Administered 2016-02-25: 1 [drp] via OPHTHALMIC
  Filled 2016-02-25: qty 2.5

## 2016-02-25 MED ORDER — DORZOLAMIDE HCL 2 % OP SOLN
1.0000 [drp] | Freq: Two times a day (BID) | OPHTHALMIC | Status: DC
  Administered 2016-02-25 – 2016-02-26 (×3): 1 [drp] via OPHTHALMIC
  Filled 2016-02-25: qty 10

## 2016-02-25 MED ORDER — HYDROCODONE-ACETAMINOPHEN 7.5-325 MG/15ML PO SOLN: 10.0000 mL | Freq: Four times a day (QID) | ORAL | Status: DC | PRN

## 2016-02-25 MED ORDER — LOSARTAN POTASSIUM 50 MG PO TABS
50.0000 mg | ORAL_TABLET | ORAL | Status: DC
  Administered 2016-02-25 – 2016-02-26 (×2): 50 mg via ORAL
  Filled 2016-02-25 (×2): qty 1

## 2016-02-25 MED ORDER — IOPAMIDOL (ISOVUE-300) INJECTION 61%
INTRAVENOUS | Status: DC | PRN
  Administered 2016-02-25: 50 mL via INTRAVENOUS

## 2016-02-25 MED ORDER — SODIUM CHLORIDE 0.9% FLUSH
3.0000 mL | Freq: Two times a day (BID) | INTRAVENOUS | Status: DC
  Administered 2016-02-25 – 2016-02-26 (×3): 3 mL via INTRAVENOUS

## 2016-02-25 SURGICAL SUPPLY — 10 items
CATH 5FR JL4 DIAGNOSTIC (CATHETERS) ×1 IMPLANT
CATH 5FR JR4 DIAGNOSTIC (CATHETERS) ×1 IMPLANT
CATH 5FR PIGTAIL DIAGNOSTIC (CATHETERS) ×1 IMPLANT
DEVICE CLOSURE MYNXGRIP 5F (Vascular Products) ×1 IMPLANT
KIT MANI 3VAL PERCEP (MISCELLANEOUS) ×2 IMPLANT
NDL PERC 18GX7CM (NEEDLE) IMPLANT
NEEDLE PERC 18GX7CM (NEEDLE) ×2 IMPLANT
PACK CARDIAC CATH (CUSTOM PROCEDURE TRAY) ×2 IMPLANT
SHEATH AVANTI 5FR X 11CM (SHEATH) ×2 IMPLANT
WIRE EMERALD 3MM-J .035X150CM (WIRE) ×2 IMPLANT

## 2016-02-25 NOTE — Consult Note (Signed)
[facility]  CARDIOLOGY CONSULT NOTE  Patient ID: Jerry English MRN: [id number] DOB/AGE: [birth year] 75 y.o.  Admit date: 02/24/2016 Referring Physician [facility] Primary Physician   Primary Cardiologist Dr. Ubaldo Glassing Reason for Consultation unstable angina  HPI: Pt is 75 yo male with history of cardiomyopathy with ef of less than 30%, history of pci of rca in 1997 who has a aicd in place. He noted midsternal chest discomfort and sob with activity. He states he noted it on Monday with some improvement yesterday but still had sob. In the er cxr did not show significant pulmonary edema. He noted burning in his chest which was similar to what he had prior to his stent. He had a troponin of 0.05. He is currently pain free. EKG was paced. AICD was interogated with normal funciton and no evidence of volume overload.   Review of Systems  Constitutional: Negative.   HENT: Negative.   Eyes: Negative.   Respiratory: Positive for cough and shortness of breath.   Cardiovascular: Positive for chest pain.  Gastrointestinal: Negative.   Genitourinary: Negative.   Musculoskeletal: Negative.   Skin: Negative.   Neurological: Negative.   Endo/Heme/Allergies: Negative.   Psychiatric/Behavioral: Negative.     Past Medical History:  Diagnosis Date  . CHF (congestive heart failure) ([place])   . Glaucoma   . Hypertension   . Myocardial infarction   . Presence of combination internal cardiac defibrillator (ICD) and pacemaker   . Presence of permanent cardiac pacemaker   . Seizures ([place])     Family History  Problem Relation Age of Onset  . Heart attack Mother   . Heart attack Father   . Heart attack Maternal Grandfather   . Heart attack Paternal Grandfather     Social History   Social History  . Marital status: Married    Spouse name: N/A  . Number of children: N/A  . Years of education: N/A   Occupational History  . Not on file.   Social History Main  Topics  . Smoking status: Never Smoker  . Smokeless tobacco: Never Used  . Alcohol use No  . Drug use: No  . Sexual activity: Not on file   Other Topics Concern  . Not on file   Social History Narrative  . No narrative on file    Past Surgical History:  Procedure Laterality Date  . BRAIN SURGERY    . CARDIAC CATHETERIZATION    . COLONOSCOPY WITH PROPOFOL N/A 10/02/2014   Procedure: COLONOSCOPY WITH PROPOFOL;  Surgeon: Hulen Luster, MD;  Location: [facility] ENDOSCOPY;  Service: Gastroenterology;  Laterality: N/A;  . ESOPHAGOGASTRODUODENOSCOPY (EGD) WITH PROPOFOL N/A 10/02/2014   Procedure: ESOPHAGOGASTRODUODENOSCOPY (EGD) WITH PROPOFOL;  Surgeon: Hulen Luster, MD;  Location: [facility] ENDOSCOPY;  Service: Gastroenterology;  Laterality: N/A;  . INSERT / REPLACE / REMOVE PACEMAKER    . JOINT REPLACEMENT     left knee  . MICROLARYNGOSCOPY W/VOCAL CORD INJECTION N/A 03/05/2015   Procedure: MICROLARYNGOSCOPY WITH VOCAL CORD INJECTION;  Surgeon: Carloyn Manner, MD;  Location: [facility] ORS;  Service: ENT;  Laterality: N/A;     Prescriptions Prior to Admission  Medication Sig Dispense Refill Last Dose  . amiodarone (PACERONE) 200 MG tablet Take 200 mg by mouth every morning.   02/24/2016 at Unknown time  . aspirin EC 81 MG tablet Take 162 mg by mouth every morning.   02/24/2016 at Unknown time  . dorzolamide (TRUSOPT) 2 % ophthalmic solution Place into both  eyes 2 (two) times daily.   02/24/2016 at Unknown time  . furosemide (LASIX) 20 MG tablet Take 20 mg by mouth every morning.   02/24/2016 at Unknown time  . latanoprost (XALATAN) 0.005 % ophthalmic solution Place 1 drop into both eyes at bedtime.   02/23/2016 at Unknown time  . losartan (COZAAR) 50 MG tablet Take 50 mg by mouth every morning.   02/24/2016 at Unknown time  . meloxicam (MOBIC) 7.5 MG tablet Take 7.5 mg by mouth daily.   02/24/2016 at Unknown time  . pantoprazole (PROTONIX) 40 MG tablet Take 40 mg by mouth daily.   02/24/2016 at Unknown time   . simvastatin (ZOCOR) 40 MG tablet Take 40 mg by mouth daily at 6 PM.    02/24/2016 at Unknown time  . timolol (TIMOPTIC) 0.5 % ophthalmic solution Place 1 drop into both eyes 2 (two) times daily.   02/24/2016 at Unknown time    Physical Exam: Blood pressure 120/78, pulse 61, temperature 97.7 F (36.5 C), temperature source Oral, resp. rate 17, height 6\' 1"  (1.854 m), weight 223 lb (101.2 kg), SpO2 93 %.   Wt Readings from Last 1 Encounters:  02/25/16 223 lb (101.2 kg)     General appearance: alert and cooperative Head: Normocephalic, without obvious abnormality, atraumatic Resp: diminished breath sounds bibasilar Chest wall: no tenderness Cardio: a v paced rhythm Extremities: extremities normal, atraumatic, no cyanosis or edema Neurologic: Grossly normal  Labs:   Lab Results  Component Value Date   WBC 5.7 02/25/2016   HGB 13.6 02/25/2016   HCT 40.6 02/25/2016   MCV 99.0 02/25/2016   PLT 122 (L) 02/25/2016    Recent Labs Lab 02/25/16 0617  NA 140  K 3.9  CL 107  CO2 27  BUN 21*  CREATININE 1.55*  CALCIUM 8.5*  GLUCOSE 87   Lab Results  Component Value Date   CKTOTAL 199 07/12/2012   CKMB 1.6 07/12/2012   TROPONINI 0.04 ([place]) 02/25/2016      Radiology: cxr showed no active cardiopulmonary disease.  EKG: a v paced.  ASSESSMENT AND PLAN:  Pt with cardiomyopathy with ef less than 30% who was admitted with progressive sob and exertional chest tightness. Has trivial troponin elevation. Pain similar to angina. Pacer working normally with no arrhythmia or aicd treatments. Will proceed with left heart cath with no v gram to evalaute anatomy to guide further therapy.  Signed: Teodoro Spray MD, [address] [facility] 02/25/2016, 9:55 AM

## 2016-02-25 NOTE — Progress Notes (Signed)
[place] [facility] [place] NAME: Shant Renew    MR#:  [id number]  DATE OF BIRTH:  12-Mar-1941  SUBJECTIVE:  CHIEF COMPLAINT:   Chief Complaint  Patient presents with  . Shortness of Breath   No chest pain, nausea or diaphoresis. REVIEW OF SYSTEMS:  Review of Systems  Constitutional: Negative for chills, fever and malaise/fatigue.  HENT: Negative for congestion.   Eyes: Negative for blurred vision and double vision.  Respiratory: Negative for cough, hemoptysis, shortness of breath, wheezing and stridor.   Cardiovascular: Negative for chest pain, palpitations, orthopnea and leg swelling.  Gastrointestinal: Negative for abdominal pain, blood in stool, diarrhea, melena, nausea and vomiting.  Genitourinary: Negative for dysuria, frequency and hematuria.  Musculoskeletal: Negative for joint pain.  Skin: Negative for itching and rash.  Neurological: Negative for dizziness, focal weakness and loss of consciousness.  Psychiatric/Behavioral: Negative for depression. The patient is not nervous/anxious.     DRUG ALLERGIES:   Allergies  Allergen Reactions  . Carvedilol Other (See Comments)    Hallucinations    VITALS:  Blood pressure 130/76, pulse (!) 57, temperature 97.3 F (36.3 C), temperature source Oral, resp. rate 17, height 6\' 1"  (1.854 m), weight 223 lb (101.2 kg), SpO2 95 %. PHYSICAL EXAMINATION:  Physical Exam  Constitutional: He is oriented to person, place, and time and well-developed, well-nourished, and in no distress.  HENT:  Head: Normocephalic.  Mouth/Throat: Oropharynx is clear and moist.  Eyes: Conjunctivae and EOM are normal.  Neck: Normal range of motion. Neck supple. No JVD present. No tracheal deviation present.  Cardiovascular: Normal rate, regular rhythm and normal heart sounds.  Exam reveals no gallop.   No murmur heard. Pulmonary/Chest: Effort normal and breath sounds normal. No respiratory distress. He has no  wheezes. He has no rales.  Abdominal: Soft. Bowel sounds are normal. He exhibits no distension. There is no tenderness.  Musculoskeletal: Normal range of motion. He exhibits no edema or tenderness.  Neurological: He is alert and oriented to person, place, and time. No cranial nerve deficit.  Skin: No rash noted. No erythema.  Psychiatric: Affect and judgment normal.   LABORATORY PANEL:   CBC  Recent Labs Lab 02/25/16 0617  WBC 5.7  HGB 13.6  HCT 40.6  PLT 122*   ------------------------------------------------------------------------------------------------------------------ Chemistries   Recent Labs Lab 02/25/16 0617  NA 140  K 3.9  CL 107  CO2 27  GLUCOSE 87  BUN 21*  CREATININE 1.55*  CALCIUM 8.5*   RADIOLOGY:  Dg Chest 2 View  Result Date: 02/24/2016 CLINICAL DATA:  Shortness of Breath EXAM: CHEST  2 VIEW COMPARISON:  08/09/2014 FINDINGS: Cardiac shadow is within normal limits. A defibrillator is again seen. Lungs are clear bilaterally. No acute bony abnormality is seen. IMPRESSION: No active cardiopulmonary disease. Electronically Signed   By: Inez Catalina M.D.   On: 02/24/2016 19:28   ASSESSMENT AND PLAN:   Unstable angina with elevated cardiac enzymes  Pt underwent cardiac cath which revealed no evidence of progression of CAD, No evidence of chf. Pace maker was interogated and showed no evidence of atrial or ventricular arrhythmia. Will continue with medical management per Dr. Ubaldo Glassing, echocardiogram is pending. Continue aspirin and Lipitor.    HTN (hypertension) - stable, continue home meds   Chronic systolic CHF (congestive heart failure) ([place]) - does not seem to be in acute exacerbation, continue Lasix.   CAD (coronary artery disease) - continue home meds. CKD stage III. Stable.  All the records are reviewed and case discussed with Care Management/Social Worker. Management plans discussed with the patient, his son and they are in agreement.  CODE STATUS:  Full code  TOTAL TIME TAKING CARE OF THIS PATIENT: 33 minutes.   More than 50% of the time was spent in counseling/coordination of care: YES  POSSIBLE D/C IN 1 DAYS, DEPENDING ON CLINICAL CONDITION.   Demetrios Loll M.D on 02/25/2016 at 3:57 PM  Between 7am to 6pm - Pager - [phone]  After 6pm go to [email or web address] - Technical brewer [facility]  Office  [phone]  CC: Primary care physician; FITZGERALD, DAVID Mamie Nick, MD  Note: This dictation was prepared with Dragon dictation along with smaller phrase technology. Any transcriptional errors that result from this process are unintentional.

## 2016-02-25 NOTE — Progress Notes (Signed)
Pt returned from cath lab s/p lt heart catheterization, dressing to rt groin dry and intact.  Pulses equal bil.  SL rt hand and lt ac in place.  Denies need at this time.  CB in reach, family at bedside.

## 2016-02-25 NOTE — Plan of Care (Signed)
Problem: Cardiac: Goal: Ability to achieve and maintain adequate cardiopulmonary perfusion will improve Outcome: Progressing No voiced complaints of chest pain.  Minimal shortness of breath with exertion noted.

## 2016-02-25 NOTE — Care Management Obs Status (Signed)
[facility] NOTIFICATION   Patient Details  Name: Jerry English MRN: [id number] Date of Birth: [birth year]   Medicare Observation Status Notification Given:  No  CM has made three attempts to obtain signature on observation notice.  Patient has not been available    Katrina Stack, RN 02/25/2016, 3:14 PM

## 2016-02-25 NOTE — Progress Notes (Signed)
*  PRELIMINARY RESULTS* Echocardiogram 2D Echocardiogram has been performed.  Sherrie Sport 02/25/2016, 2:50 PM

## 2016-02-25 NOTE — Progress Notes (Signed)
Pt complaining of feeling SOB, VSS, Dr. Ubaldo Glassing on floor, in to see pt.  Orders for Lasix 20mg  iv now obtained.  Will monitor pt.

## 2016-02-25 NOTE — Progress Notes (Signed)
Pt underwent cardiac cath which revealed no evidence of progression of cad. EDP was 4. No evidence of chf. Pace maker was interogated and showed no evidence of atrial or ventricular arrhythmia. Will continue with medical management. Ambulate this afternoon. Consider discharge in am if stable.

## 2016-02-25 NOTE — Progress Notes (Signed)
To cath lab via bed.

## 2016-02-26 ENCOUNTER — Encounter: Payer: Self-pay | Admitting: Cardiology

## 2016-02-26 DIAGNOSIS — I25110 Atherosclerotic heart disease of native coronary artery with unstable angina pectoris: Secondary | ICD-10-CM | POA: Diagnosis not present

## 2016-02-26 LAB — BASIC METABOLIC PANEL
ANION GAP: 6 (ref 5–15)
BUN: 25 mg/dL — AB (ref 6–20)
CALCIUM: 8.8 mg/dL — AB (ref 8.9–10.3)
CO2: 26 mmol/L (ref 22–32)
CREATININE: 1.6 mg/dL — AB (ref 0.61–1.24)
Chloride: 107 mmol/L (ref 101–111)
GFR calc Af Amer: 48 mL/min — ABNORMAL LOW (ref >60)
GFR calc non Af Amer: 41 mL/min — ABNORMAL LOW (ref >60)
Glucose, Bld: 89 mg/dL (ref 65–99)
Potassium: 3.9 mmol/L (ref 3.5–5.1)
SODIUM: 139 mmol/L (ref 135–145)

## 2016-02-26 NOTE — Progress Notes (Signed)
[place] [facility]  SUBJECTIVE: feels better with less sob   Vitals:   02/25/16 1703 02/25/16 1917 02/26/16 0553 02/26/16 0816  BP: 132/85 116/69 123/70 118/79  Pulse: 61 (!) 59 63 (!) 59  Resp:  15 14 16   Temp:  98.3 F (36.8 C) 97.9 F (36.6 C)   TempSrc:  Oral Oral   SpO2: 95% 98% 95% 93%  Weight:   219 lb 8 oz (99.6 kg)   Height:        Intake/Output Summary (Last 24 hours) at 02/26/16 [id number] Last data filed at 02/25/16 1926  Gross per 24 hour  Intake              243 ml  Output                0 ml  Net              243 ml    LABS: Basic Metabolic Panel:  Recent Labs  02/25/16 0617 02/26/16 0802  NA 140 139  K 3.9 3.9  CL 107 107  CO2 27 26  GLUCOSE 87 89  BUN 21* 25*  CREATININE 1.55* 1.60*  CALCIUM 8.5* 8.8*   Liver Function Tests: No results for input(s): AST, ALT, ALKPHOS, BILITOT, PROT, ALBUMIN in the last 72 hours. No results for input(s): LIPASE, AMYLASE in the last 72 hours. CBC:  Recent Labs  02/24/16 1855 02/25/16 0617  WBC 7.1 5.7  HGB 15.1 13.6  HCT 44.6 40.6  MCV 101.1* 99.0  PLT 145* 122*   Cardiac Enzymes:  Recent Labs  02/25/16 0038 02/25/16 0617 02/25/16 1539  TROPONINI 0.05* 0.04* 0.04*   BNP: Invalid input(s): POCBNP D-Dimer: No results for input(s): DDIMER in the last 72 hours. Hemoglobin A1C: No results for input(s): HGBA1C in the last 72 hours. Fasting Lipid Panel: No results for input(s): CHOL, HDL, LDLCALC, TRIG, CHOLHDL, LDLDIRECT in the last 72 hours. Thyroid Function Tests: No results for input(s): TSH, T4TOTAL, T3FREE, THYROIDAB in the last 72 hours.  Invalid input(s): FREET3 Anemia Panel: No results for input(s): VITAMINB12, FOLATE, FERRITIN, TIBC, IRON, RETICCTPCT in the last 72 hours.   Physical Exam: Blood pressure 118/79, pulse (!) 59, temperature 97.9 F (36.6 C), temperature source Oral, resp. rate 16, height 6\' 1"  (1.854 m), weight 219 lb 8 oz (99.6 kg), SpO2 93 %.    Wt Readings from Last 1 Encounters:  02/26/16 219 lb 8 oz (99.6 kg)     General appearance: alert and cooperative Resp: clear to auscultation bilaterally Cardio: paced rhythm GI: soft, non-tender; bowel sounds normal; no masses,  no organomegaly Pulses: 2+ and symmetric Neurologic: Grossly normal  TELEMETRY: Reviewed telemetry pt in av paced:  ASSESSMENT AND PLAN:  Principal Problem:   Unstable angina (HCC)-ruled out for an mi. Cardiac cath revealed patent stent in RCA with no significant left coronary disease. EF 35-40 by echo. Continue with current meds. Active Problems:   HTN (hypertension)   Chronic systolic CHF (congestive heart failure) (HCC)-ef 35-40 by echo. EDP was 4-7 by cath. No Optival abnormalities. No evidence of chf. Continue with current meds. OK for discharge from cardiac standpoint.    CAD (coronary artery disease)- as per above.     Teodoro Spray, MD, [facility] 02/26/2016 9:07 AM

## 2016-02-26 NOTE — Discharge Instructions (Signed)
Heart healthy diet

## 2016-02-26 NOTE — Discharge Summary (Signed)
[place] at [place] NAME: Jerry English    MR#:  [id number]  DATE OF BIRTH:  1940-04-30  DATE OF ADMISSION:  02/24/2016   ADMITTING PHYSICIAN: Lance Coon, MD  DATE OF DISCHARGE: 02/26/2016 10:50 AM  PRIMARY CARE PHYSICIAN: Leonel Ramsay, MD   ADMISSION DIAGNOSIS:  Precordial chest pain [R07.2] Elevated troponin [R74.8] DISCHARGE DIAGNOSIS:  Principal Problem:   Unstable angina ([place]) Active Problems:   HTN (hypertension)   Chronic systolic CHF (congestive heart failure) (HCC)   CAD (coronary artery disease)  SECONDARY DIAGNOSIS:   Past Medical History:  Diagnosis Date  . CHF (congestive heart failure) ([place])   . Glaucoma   . Hypertension   . Myocardial infarction   . Presence of combination internal cardiac defibrillator (ICD) and pacemaker   . Presence of permanent cardiac pacemaker   . Seizures [facility])    HOSPITAL COURSE:  Unstable angina with elevated cardiac enzymes  Pt underwent cardiac cath which revealed no evidence of progression of CAD, No evidence of chf. Pace maker was interogated and showed no evidence of atrial or ventricular arrhythmia. Will continue with medical management per Dr. Ubaldo Glassing, Echocardiogram: Left ventricle: The cavity size was mildly dilated. Systolic   function was moderately reduced. The estimated ejection fraction   was in the range of 35% to 40% Continue aspirin and Lipitor.  HTN (hypertension) - stable, continue home meds Chronic systolic CHF (congestive heart failure) ([place]) - does not seem to be in acute exacerbation, continue Lasix. CAD (coronary artery disease) - continue home meds. CKD stage III. Stable. Discussed with Dr. Ubaldo Glassing. DISCHARGE CONDITIONS:  Stable, discharged to home today. CONSULTS OBTAINED:  Treatment Team:  Teodoro Spray, MD DRUG ALLERGIES:   Allergies  Allergen Reactions  . Carvedilol Other (See Comments)    Hallucinations    DISCHARGE MEDICATIONS:       Medication List    TAKE these medications   amiodarone 200 MG tablet Commonly known as:  PACERONE Take 200 mg by mouth every morning.   aspirin EC 81 MG tablet Take 162 mg by mouth every morning.   dorzolamide 2 % ophthalmic solution Commonly known as:  TRUSOPT Place into both eyes 2 (two) times daily.   furosemide 20 MG tablet Commonly known as:  LASIX Take 20 mg by mouth every morning.   latanoprost 0.005 % ophthalmic solution Commonly known as:  XALATAN Place 1 drop into both eyes at bedtime.   losartan 50 MG tablet Commonly known as:  COZAAR Take 50 mg by mouth every morning.   meloxicam 7.5 MG tablet Commonly known as:  MOBIC Take 7.5 mg by mouth daily. Notes to patient:  NONE TODAY   pantoprazole 40 MG tablet Commonly known as:  PROTONIX Take 40 mg by mouth daily.   simvastatin 40 MG tablet Commonly known as:  ZOCOR Take 40 mg by mouth daily at 6 PM. Notes to patient:  NONE TODAY   timolol 0.5 % ophthalmic solution Commonly known as:  TIMOPTIC Place 1 drop into both eyes 2 (two) times daily.        DISCHARGE INSTRUCTIONS:  See AVS.  If you experience worsening of your admission symptoms, develop shortness of breath, life threatening emergency, suicidal or homicidal thoughts you must seek medical attention immediately by calling 911 or calling your MD immediately  if symptoms less severe.  You Must read complete instructions/literature along with all the possible adverse reactions/side effects for all the Medicines  you take and that have been prescribed to you. Take any new Medicines after you have completely understood and accpet all the possible adverse reactions/side effects.   Please note  You were cared for by a hospitalist during your hospital stay. If you have any questions about your discharge medications or the care you received while you were in the hospital after you are discharged, you can call the unit and asked to speak with the  hospitalist on call if the hospitalist that took care of you is not available. Once you are discharged, your primary care physician will handle any further medical issues. Please note that NO REFILLS for any discharge medications will be authorized once you are discharged, as it is imperative that you return to your primary care physician (or establish a relationship with a primary care physician if you do not have one) for your aftercare needs so that they can reassess your need for medications and monitor your lab values.    On the day of Discharge:  VITAL SIGNS:  Blood pressure 118/79, pulse (!) 59, temperature 97.9 F (36.6 C), temperature source Oral, resp. rate 16, height 6\' 1"  (1.854 m), weight 219 lb 8 oz (99.6 kg), SpO2 93 %. PHYSICAL EXAMINATION:  GENERAL:  75 y.o.-year-old patient lying in the bed with no acute distress.  EYES: Pupils equal, round, reactive to light and accommodation. No scleral icterus. Extraocular muscles intact.  HEENT: Head atraumatic, normocephalic. Oropharynx and nasopharynx clear.  NECK:  Supple, no jugular venous distention. No thyroid enlargement, no tenderness.  LUNGS: Normal breath sounds bilaterally, no wheezing, rales,rhonchi or crepitation. No use of accessory muscles of respiration.  CARDIOVASCULAR: S1, S2 normal. No murmurs, rubs, or gallops.  ABDOMEN: Soft, non-tender, non-distended. Bowel sounds present. No organomegaly or mass.  EXTREMITIES: No pedal edema, cyanosis, or clubbing.  NEUROLOGIC: Cranial nerves II through XII are intact. Muscle strength 5/5 in all extremities. Sensation intact. Gait not checked.  PSYCHIATRIC: The patient is alert and oriented x 3.  SKIN: No obvious rash, lesion, or ulcer.  DATA REVIEW:   CBC  Recent Labs Lab 02/25/16 0617  WBC 5.7  HGB 13.6  HCT 40.6  PLT 122*    Chemistries   Recent Labs Lab 02/26/16 0802  NA 139  K 3.9  CL 107  CO2 26  GLUCOSE 89  BUN 25*  CREATININE 1.60*  CALCIUM 8.8*      Microbiology Results  Results for orders placed or performed in visit on 07/12/12  Culture, blood (single)     Status: None   Collection Time: 07/12/12  6:24 PM  Result Value Ref Range Status   Micro Text Report   Final       COMMENT                   NO GROWTH AEROBICALLY/ANAEROBICALLY IN 5 DAYS   ANTIBIOTIC                                                      Culture, blood (single)     Status: None   Collection Time: 07/12/12  6:24 PM  Result Value Ref Range Status   Micro Text Report   Final       COMMENT  NO GROWTH AEROBICALLY/ANAEROBICALLY IN 5 DAYS   ANTIBIOTIC                                                        RADIOLOGY:  No results found.   Management plans discussed with the patient, His wife and they are in agreement.  CODE STATUS:  Code Status History    Date Active Date Inactive Code Status Order ID Comments User Context   02/25/2016 12:26 AM 02/26/2016  1:50 PM Full Code [id number]  Lance Coon, MD Inpatient      TOTAL TIME TAKING CARE OF THIS PATIENT: 33 minutes.    Demetrios Loll M.D on 02/26/2016 at 6:05 PM  Between 7am to 6pm - Pager - [phone]  After 6pm go to [email or web address] - Technical brewer [facility]  Office  [phone]  CC: Primary care physician; FITZGERALD, DAVID Mamie Nick, MD   Note: This dictation was prepared with Dragon dictation along with smaller phrase technology. Any transcriptional errors that result from this process are unintentional.

## 2016-03-07 ENCOUNTER — Other Ambulatory Visit: Payer: Self-pay | Admitting: Cardiology

## 2016-03-07 DIAGNOSIS — R0689 Other abnormalities of breathing: Secondary | ICD-10-CM

## 2016-03-07 DIAGNOSIS — R0600 Dyspnea, unspecified: Secondary | ICD-10-CM

## 2016-03-15 ENCOUNTER — Encounter: Payer: Self-pay | Admitting: Emergency Medicine

## 2016-03-15 ENCOUNTER — Inpatient Hospital Stay
Admission: EM | Admit: 2016-03-15 | Discharge: 2016-03-18 | DRG: 176 | Disposition: A | Discharge status: Home / Self Care | Payer: Medicare Other | Attending: Internal Medicine | Admitting: Internal Medicine

## 2016-03-15 ENCOUNTER — Other Ambulatory Visit: Payer: Self-pay

## 2016-03-15 ENCOUNTER — Ambulatory Visit
Admission: RE | Admit: 2016-03-15 | Discharge: 2016-03-15 | Disposition: A | Discharge status: Home / Self Care | Payer: Medicare Other | Source: Ambulatory Visit | Attending: Cardiology | Admitting: Cardiology

## 2016-03-15 DIAGNOSIS — J101 Influenza due to other identified influenza virus with other respiratory manifestations: Secondary | ICD-10-CM | POA: Diagnosis not present

## 2016-03-15 DIAGNOSIS — R0600 Dyspnea, unspecified: Secondary | ICD-10-CM | POA: Insufficient documentation

## 2016-03-15 DIAGNOSIS — Z8249 Family history of ischemic heart disease and other diseases of the circulatory system: Secondary | ICD-10-CM

## 2016-03-15 DIAGNOSIS — I252 Old myocardial infarction: Secondary | ICD-10-CM

## 2016-03-15 DIAGNOSIS — I2584 Coronary atherosclerosis due to calcified coronary lesion: Secondary | ICD-10-CM

## 2016-03-15 DIAGNOSIS — H409 Unspecified glaucoma: Secondary | ICD-10-CM | POA: Diagnosis present

## 2016-03-15 DIAGNOSIS — I2699 Other pulmonary embolism without acute cor pulmonale: Secondary | ICD-10-CM | POA: Diagnosis present

## 2016-03-15 DIAGNOSIS — N183 Chronic kidney disease, stage 3 (moderate): Secondary | ICD-10-CM | POA: Diagnosis present

## 2016-03-15 DIAGNOSIS — I130 Hypertensive heart and chronic kidney disease with heart failure and stage 1 through stage 4 chronic kidney disease, or unspecified chronic kidney disease: Secondary | ICD-10-CM | POA: Diagnosis present

## 2016-03-15 DIAGNOSIS — R0602 Shortness of breath: Secondary | ICD-10-CM

## 2016-03-15 DIAGNOSIS — I2510 Atherosclerotic heart disease of native coronary artery without angina pectoris: Secondary | ICD-10-CM | POA: Diagnosis present

## 2016-03-15 DIAGNOSIS — Z7982 Long term (current) use of aspirin: Secondary | ICD-10-CM | POA: Diagnosis not present

## 2016-03-15 DIAGNOSIS — R911 Solitary pulmonary nodule: Secondary | ICD-10-CM

## 2016-03-15 DIAGNOSIS — Z79899 Other long term (current) drug therapy: Secondary | ICD-10-CM | POA: Diagnosis not present

## 2016-03-15 DIAGNOSIS — R0689 Other abnormalities of breathing: Secondary | ICD-10-CM

## 2016-03-15 DIAGNOSIS — E785 Hyperlipidemia, unspecified: Secondary | ICD-10-CM | POA: Diagnosis present

## 2016-03-15 DIAGNOSIS — G40909 Epilepsy, unspecified, not intractable, without status epilepticus: Secondary | ICD-10-CM | POA: Diagnosis present

## 2016-03-15 DIAGNOSIS — I480 Paroxysmal atrial fibrillation: Secondary | ICD-10-CM | POA: Diagnosis present

## 2016-03-15 DIAGNOSIS — I5022 Chronic systolic (congestive) heart failure: Secondary | ICD-10-CM | POA: Diagnosis present

## 2016-03-15 DIAGNOSIS — Z950 Presence of cardiac pacemaker: Secondary | ICD-10-CM | POA: Diagnosis not present

## 2016-03-15 LAB — COMPREHENSIVE METABOLIC PANEL
ALK PHOS: 69 U/L (ref 38–126)
ALT: 18 U/L (ref 17–63)
ANION GAP: 9 (ref 5–15)
AST: 26 U/L (ref 15–41)
Albumin: 3.7 g/dL (ref 3.5–5.0)
BILIRUBIN TOTAL: 0.9 mg/dL (ref 0.3–1.2)
BUN: 18 mg/dL (ref 6–20)
CALCIUM: 8.8 mg/dL — AB (ref 8.9–10.3)
CO2: 26 mmol/L (ref 22–32)
Chloride: 101 mmol/L (ref 101–111)
Creatinine, Ser: 1.72 mg/dL — ABNORMAL HIGH (ref 0.61–1.24)
GFR calc non Af Amer: 38 mL/min — ABNORMAL LOW (ref >60)
GFR, EST AFRICAN AMERICAN: 44 mL/min — AB (ref >60)
Glucose, Bld: 138 mg/dL — ABNORMAL HIGH (ref 65–99)
Potassium: 3.7 mmol/L (ref 3.5–5.1)
SODIUM: 136 mmol/L (ref 135–145)
TOTAL PROTEIN: 6.9 g/dL (ref 6.5–8.1)

## 2016-03-15 LAB — CBC
HCT: 41.2 % (ref 40.0–52.0)
HEMOGLOBIN: 14.1 g/dL (ref 13.0–18.0)
MCH: 33.8 pg (ref 26.0–34.0)
MCHC: 34.2 g/dL (ref 32.0–36.0)
MCV: 98.8 fL (ref 80.0–100.0)
Platelets: 207 10*3/uL (ref 150–440)
RBC: 4.17 MIL/uL — ABNORMAL LOW (ref 4.40–5.90)
RDW: 14.5 % (ref 11.5–14.5)
WBC: 7.8 10*3/uL (ref 3.8–10.6)

## 2016-03-15 LAB — TROPONIN I: Troponin I: <0.03 ng/mL (ref <0.03)

## 2016-03-15 LAB — APTT: aPTT: 30 seconds (ref 24–36)

## 2016-03-15 LAB — PROTIME-INR
INR: 1.02
PROTHROMBIN TIME: 13.4 s (ref 11.4–15.2)

## 2016-03-15 MED ORDER — IOPAMIDOL (ISOVUE-370) INJECTION 76%
60.0000 mL | Freq: Once | INTRAVENOUS | Status: AC | PRN
  Administered 2016-03-15: 60 mL via INTRAVENOUS

## 2016-03-15 MED ORDER — HEPARIN BOLUS VIA INFUSION
4000.0000 [IU] | Freq: Once | INTRAVENOUS | Status: AC
  Administered 2016-03-15: 4000 [IU] via INTRAVENOUS
  Filled 2016-03-15: qty 4000

## 2016-03-15 MED ORDER — SODIUM CHLORIDE 0.9% FLUSH
3.0000 mL | Freq: Two times a day (BID) | INTRAVENOUS | Status: DC
  Administered 2016-03-15 – 2016-03-17 (×4): 3 mL via INTRAVENOUS

## 2016-03-15 MED ORDER — SODIUM CHLORIDE 0.9% FLUSH
3.0000 mL | Freq: Two times a day (BID) | INTRAVENOUS | Status: DC
  Administered 2016-03-17 (×2): 3 mL via INTRAVENOUS

## 2016-03-15 MED ORDER — SODIUM CHLORIDE 0.9% FLUSH
3.0000 mL | INTRAVENOUS | Status: DC | PRN
Start: 2016-03-15 — End: 2016-03-18

## 2016-03-15 MED ORDER — ONDANSETRON HCL 4 MG PO TABS: 4.0000 mg | ORAL_TABLET | Freq: Four times a day (QID) | ORAL | Status: DC | PRN

## 2016-03-15 MED ORDER — ACETAMINOPHEN 325 MG PO TABS
650.0000 mg | ORAL_TABLET | Freq: Four times a day (QID) | ORAL | Status: DC | PRN
Start: 2016-03-15 — End: 2016-03-18
  Administered 2016-03-15 – 2016-03-17 (×4): 650 mg via ORAL
  Filled 2016-03-15 (×4): qty 2

## 2016-03-15 MED ORDER — AMIODARONE HCL 200 MG PO TABS
100.0000 mg | ORAL_TABLET | ORAL | Status: DC
  Administered 2016-03-16 – 2016-03-18 (×3): 100 mg via ORAL
  Filled 2016-03-15 (×3): qty 1

## 2016-03-15 MED ORDER — ACETAMINOPHEN 650 MG RE SUPP: 650.0000 mg | Freq: Four times a day (QID) | RECTAL | Status: DC | PRN

## 2016-03-15 MED ORDER — HEPARIN (PORCINE) IN NACL 100-0.45 UNIT/ML-% IJ SOLN
1650.0000 [IU]/h | INTRAMUSCULAR | Status: DC
  Administered 2016-03-15: 1650 [IU]/h via INTRAVENOUS
  Filled 2016-03-15 (×3): qty 250

## 2016-03-15 MED ORDER — ONDANSETRON HCL 4 MG/2ML IJ SOLN: 4.0000 mg | Freq: Four times a day (QID) | INTRAMUSCULAR | Status: DC | PRN

## 2016-03-15 MED ORDER — TIMOLOL MALEATE 0.5 % OP SOLN
1.0000 [drp] | Freq: Two times a day (BID) | OPHTHALMIC | Status: DC
  Administered 2016-03-15 – 2016-03-18 (×5): 1 [drp] via OPHTHALMIC
  Filled 2016-03-15: qty 5

## 2016-03-15 MED ORDER — SODIUM CHLORIDE 0.9 % IV SOLN: 250.0000 mL | INTRAVENOUS | Status: DC | PRN

## 2016-03-15 MED ORDER — LOSARTAN POTASSIUM 50 MG PO TABS
50.0000 mg | ORAL_TABLET | ORAL | Status: DC
  Filled 2016-03-15: qty 1

## 2016-03-15 MED ORDER — PANTOPRAZOLE SODIUM 40 MG PO TBEC
40.0000 mg | DELAYED_RELEASE_TABLET | Freq: Every day | ORAL | Status: DC
  Administered 2016-03-16 – 2016-03-18 (×3): 40 mg via ORAL
  Filled 2016-03-15 (×3): qty 1

## 2016-03-15 MED ORDER — ALBUTEROL SULFATE (2.5 MG/3ML) 0.083% IN NEBU: 2.5000 mg | INHALATION_SOLUTION | RESPIRATORY_TRACT | Status: DC | PRN

## 2016-03-15 MED ORDER — SIMVASTATIN 40 MG PO TABS: 40.0000 mg | ORAL_TABLET | Freq: Every day | ORAL | Status: DC

## 2016-03-15 MED ORDER — HYDROCODONE-ACETAMINOPHEN 5-325 MG PO TABS: 1.0000 | ORAL_TABLET | ORAL | Status: DC | PRN

## 2016-03-15 MED ORDER — ASPIRIN EC 81 MG PO TBEC
162.0000 mg | DELAYED_RELEASE_TABLET | ORAL | Status: DC
  Administered 2016-03-16: 162 mg via ORAL
  Filled 2016-03-15: qty 2

## 2016-03-15 MED ORDER — LATANOPROST 0.005 % OP SOLN
1.0000 [drp] | Freq: Every day | OPHTHALMIC | Status: DC
  Administered 2016-03-15 – 2016-03-17 (×2): 1 [drp] via OPHTHALMIC
  Filled 2016-03-15: qty 2.5

## 2016-03-15 MED ORDER — FUROSEMIDE 20 MG PO TABS
20.0000 mg | ORAL_TABLET | ORAL | Status: DC
  Administered 2016-03-16 – 2016-03-18 (×3): 20 mg via ORAL
  Filled 2016-03-15 (×3): qty 1

## 2016-03-15 MED ORDER — DORZOLAMIDE HCL 2 % OP SOLN
1.0000 [drp] | Freq: Two times a day (BID) | OPHTHALMIC | Status: DC
  Administered 2016-03-15 – 2016-03-18 (×5): 1 [drp] via OPHTHALMIC
  Filled 2016-03-15: qty 10

## 2016-03-15 NOTE — Progress Notes (Signed)
ANTICOAGULATION CONSULT NOTE - Initial Consult  Pharmacy Consult for Heparin  Indication: Pulmonary embolism  Allergies  Allergen Reactions  . Carvedilol Other (See Comments)    Hallucinations     Patient Measurements: Height: 6\' 1"  (185.4 cm) Weight: 219 lb (99.3 kg) IBW/kg (Calculated) : 79.9 Heparin Dosing Weight:   Vital Signs: Temp: 98.3 F (36.8 C) (12/19 1453) Temp Source: Oral (12/19 1453) BP: 105/70 (12/19 1530) Pulse Rate: 58 (12/19 1530)  Labs:  Recent Labs  03/15/16 1458  HGB 14.1  HCT 41.2  PLT 207  APTT 30  LABPROT 13.4  INR 1.02  CREATININE 1.72*  TROPONINI <0.03    Estimated Creatinine Clearance: 47.4 mL/min (by C-G formula based on SCr of 1.72 mg/dL (H)).   Medical History: Past Medical History:  Diagnosis Date  . CHF (congestive heart failure) ([place])   . Glaucoma   . Hypertension   . Myocardial infarction   . Presence of combination internal cardiac defibrillator (ICD) and pacemaker   . Presence of permanent cardiac pacemaker   . Seizures (HCC)     Medications:   (Not in a hospital admission) Scheduled:  . heparin  4,000 Units Intravenous Once   Infusions:  . heparin      Assessment: Pharmacy consulted to dose and monitor heparin in this 75 year old male being treated for pulmonary embolism Goal of Therapy:  Heparin level 0.3-0.7 units/ml Monitor platelets by anticoagulation protocol: Yes   Plan:  Give 4000 units bolus x 1 Start heparin infusion at 1650 units/hr Check anti-Xa level in 8 hours and daily while on heparin Continue to monitor H&H and platelets   Will check Heparin level @ 00:30 on 12/20.   Ekam Besson D 03/15/2016,4:09 PM

## 2016-03-15 NOTE — ED Notes (Signed)
Pt called from PCP for abdnormal CT scan,showing bilat PE. Pt was admitted to hosp last week, cardiac cath done, no abdnormal results, was told to follow up with CT that he had today. Pt denies any CP, slight SOB. Pt A&Ox4

## 2016-03-15 NOTE — ED Provider Notes (Signed)
[facility] Emergency Department Provider Note  Time seen: 3:21 PM  I have reviewed the triage vital signs and the nursing notes.   HISTORY  Chief Complaint Shortness of Breath    HPI Jerry English is a 75 y.o. male who presents to the emergency department for a CT scan positive for pulmonary embolus. According to the patient for the past 3 weeks the patient has been feeling short of breath with intermittent chest pain. He was admitted several weeks ago worked up by cardiology including a cardiac catheterization which was normal. Patient doesn't history cardiac stent 10 years ago. Patient states he continues to be short of breath, it is worsened considerably over the past 2 days. His doctor called him in for a CT scan which he had today which was positive for bilateral pulmonary emboli and the patient was referred to the ER. Here the patient states continued shortness of breath, denies any at rest but states significant shortness of breath with minimal exertion. Denies any recent leg pain, swelling, recent long trips, surgeries.  Past Medical History:  Diagnosis Date  . CHF (congestive heart failure) ([place])   . Glaucoma   . Hypertension   . Myocardial infarction   . Presence of combination internal cardiac defibrillator (ICD) and pacemaker   . Presence of permanent cardiac pacemaker   . Seizures [facility])     Patient Active Problem List   Diagnosis Date Noted  . Unstable angina ([place]) 02/24/2016  . HTN (hypertension) 02/24/2016  . Seizures ([place]) 02/24/2016  . Chronic systolic CHF (congestive heart failure) ([place]) 02/24/2016  . CAD (coronary artery disease) 02/24/2016    Past Surgical History:  Procedure Laterality Date  . BRAIN SURGERY    . CARDIAC CATHETERIZATION    . CARDIAC CATHETERIZATION N/A 02/25/2016   Procedure: Left Heart Cath and Coronary Angiography;  Surgeon: Teodoro Spray, MD;  Location: [facility] CV LAB;  Service: Cardiovascular;   Laterality: N/A;  . COLONOSCOPY WITH PROPOFOL N/A 10/02/2014   Procedure: COLONOSCOPY WITH PROPOFOL;  Surgeon: Hulen Luster, MD;  Location: [facility] ENDOSCOPY;  Service: Gastroenterology;  Laterality: N/A;  . ESOPHAGOGASTRODUODENOSCOPY (EGD) WITH PROPOFOL N/A 10/02/2014   Procedure: ESOPHAGOGASTRODUODENOSCOPY (EGD) WITH PROPOFOL;  Surgeon: Hulen Luster, MD;  Location: [facility] ENDOSCOPY;  Service: Gastroenterology;  Laterality: N/A;  . INSERT / REPLACE / REMOVE PACEMAKER    . JOINT REPLACEMENT     left knee  . MICROLARYNGOSCOPY W/VOCAL CORD INJECTION N/A 03/05/2015   Procedure: MICROLARYNGOSCOPY WITH VOCAL CORD INJECTION;  Surgeon: Carloyn Manner, MD;  Location: [facility] ORS;  Service: ENT;  Laterality: N/A;    Prior to Admission medications   Medication Sig Start Date End Date Taking? Authorizing Provider  amiodarone (PACERONE) 200 MG tablet Take 200 mg by mouth every morning.    Historical Provider, MD  aspirin EC 81 MG tablet Take 162 mg by mouth every morning.    Historical Provider, MD  dorzolamide (TRUSOPT) 2 % ophthalmic solution Place into both eyes 2 (two) times daily.    Historical Provider, MD  furosemide (LASIX) 20 MG tablet Take 20 mg by mouth every morning.    Historical Provider, MD  latanoprost (XALATAN) 0.005 % ophthalmic solution Place 1 drop into both eyes at bedtime.    Historical Provider, MD  losartan (COZAAR) 50 MG tablet Take 50 mg by mouth every morning.    Historical Provider, MD  meloxicam (MOBIC) 7.5 MG tablet Take 7.5 mg by mouth daily.    Historical  Provider, MD  pantoprazole (PROTONIX) 40 MG tablet Take 40 mg by mouth daily.    Historical Provider, MD  simvastatin (ZOCOR) 40 MG tablet Take 40 mg by mouth daily at 6 PM.     Historical Provider, MD  timolol (TIMOPTIC) 0.5 % ophthalmic solution Place 1 drop into both eyes 2 (two) times daily.    Historical Provider, MD    Allergies  Allergen Reactions  . Carvedilol Other (See Comments)    Hallucinations     Family History   Problem Relation Age of Onset  . Heart attack Mother   . Heart attack Father   . Heart attack Maternal Grandfather   . Heart attack Paternal Grandfather     Social History Social History  Substance Use Topics  . Smoking status: Never Smoker  . Smokeless tobacco: Never Used  . Alcohol use No    Review of Systems Constitutional: Negative for fever Cardiovascular: Intermittent chest pain Respiratory: Positive for shortness of breath Gastrointestinal: Negative for abdominal pain Musculoskeletal: Denies leg pain or swelling. Neurological: Negative for headache 10-point ROS otherwise negative.  ____________________________________________   PHYSICAL EXAM:  VITAL SIGNS: ED Triage Vitals  Enc Vitals Group     BP 03/15/16 1452 127/78     Pulse Rate 03/15/16 1453 77     Resp 03/15/16 1452 17     Temp 03/15/16 1453 98.3 F (36.8 C)     Temp Source 03/15/16 1453 Oral     SpO2 03/15/16 1453 96 %     Weight 03/15/16 1442 219 lb (99.3 kg)     Height 03/15/16 1442 6\' 1"  (1.854 m)     Head Circumference --      Peak Flow --      Pain Score --      Pain Loc --      Pain Edu? --      Excl. in [place]? --     Constitutional: Alert and oriented. Well appearing and in no distress. Eyes: Normal exam ENT   Head: Normocephalic and atraumatic.   Mouth/Throat: Mucous membranes are moist. Cardiovascular: Normal rate, regular rhythm. No murmur Respiratory: Normal respiratory effort without tachypnea nor retractions. Breath sounds are clear  Gastrointestinal: Soft and nontender. No distention. Musculoskeletal: Nontender with normal range of motion in all extremities. No lower extremity tenderness or edema. Neurologic:  Normal speech and language. No gross focal neurologic deficits Skin:  Skin is warm, dry and intact.  Psychiatric: Mood and affect are normal.   ____________________________________________    EKG  EKG reviewed and interpreted by myself shows ventricular paced  rhythm at 60 bpm, widened QRS with nonspecific ST changes. ____________________________________________    RADIOLOGY  CT positive for bilateral PE.  ____________________________________________   INITIAL IMPRESSION / ASSESSMENT AND PLAN / ED COURSE  Pertinent labs & imaging results that were available during my care of the patient were reviewed by me and considered in my medical decision making (see chart for details).  The patient presents to the emergency department with a CT positive for bilateral PE. We'll obtain labs, start on heparin drip given bilateral PEs with worsening dyspnea over the past 2 days. Currently the patient appears well, with normal vitals wall sitting in bed without exertion.  Labs are largely within normal limits. EKG shows a paced rhythm. Patient will be admitted to the hospital on a heparin drip for bilateral PEs.  CRITICAL CARE Performed by: Harvest Dark   Total critical care time: 30 minutes  Critical care  time was exclusive of separately billable procedures and treating other patients.  Critical care was necessary to treat or prevent imminent or life-threatening deterioration.  Critical care was time spent personally by me on the following activities: development of treatment plan with patient and/or surrogate as well as nursing, discussions with consultants, evaluation of patient's response to treatment, examination of patient, obtaining history from patient or surrogate, ordering and performing treatments and interventions, ordering and review of laboratory studies, ordering and review of radiographic studies, pulse oximetry and re-evaluation of patient's condition.   ____________________________________________   FINAL CLINICAL IMPRESSION(S) / ED DIAGNOSES  Bilateral pulmonary emboli    Harvest Dark, MD 03/15/16 1601

## 2016-03-15 NOTE — ED Triage Notes (Signed)
Sent in by PCP Had chest ct and was told he had bilateral PE

## 2016-03-15 NOTE — H&P (Signed)
[facility] [place] NAME: Jerry English    MR#:  [id number]  DATE OF BIRTH:  1940-05-11  DATE OF ADMISSION:  03/15/2016  PRIMARY CARE PHYSICIAN: Leonel Ramsay, MD   REQUESTING/REFERRING PHYSICIAN: Harvest Dark, MD  CHIEF COMPLAINT:   Chief Complaint  Patient presents with  . Shortness of Breath    HISTORY OF PRESENT ILLNESS:  Name Jerry English  is a 75 y.o. male with a known history of Hypertension, CHF, CAD and a seizure disorder. The patient has had intermittent chest pain and shortness of breath for the past 3 weeks. He was admitted several weeks ago worked up by cardiology including a cardiac catheterization which was normal. Since he has worsening shortness of breath for the past 2 days, his daughter called him for a CAT scan today which show bilateral PE. He denies any travel history or leg swelling or pain.  PAST MEDICAL HISTORY:   Past Medical History:  Diagnosis Date  . CHF (congestive heart failure) ([place])   . Glaucoma   . Hypertension   . Myocardial infarction   . Presence of combination internal cardiac defibrillator (ICD) and pacemaker   . Presence of permanent cardiac pacemaker   . Seizures ([place])     PAST SURGICAL HISTORY:   Past Surgical History:  Procedure Laterality Date  . BRAIN SURGERY    . CARDIAC CATHETERIZATION    . CARDIAC CATHETERIZATION N/A 02/25/2016   Procedure: Left Heart Cath and Coronary Angiography;  Surgeon: Teodoro Spray, MD;  Location: [facility] CV LAB;  Service: Cardiovascular;  Laterality: N/A;  . COLONOSCOPY WITH PROPOFOL N/A 10/02/2014   Procedure: COLONOSCOPY WITH PROPOFOL;  Surgeon: Hulen Luster, MD;  Location: [facility] ENDOSCOPY;  Service: Gastroenterology;  Laterality: N/A;  . ESOPHAGOGASTRODUODENOSCOPY (EGD) WITH PROPOFOL N/A 10/02/2014   Procedure: ESOPHAGOGASTRODUODENOSCOPY (EGD) WITH PROPOFOL;  Surgeon: Hulen Luster, MD;  Location: [facility] ENDOSCOPY;  Service: Gastroenterology;   Laterality: N/A;  . INSERT / REPLACE / REMOVE PACEMAKER    . JOINT REPLACEMENT     left knee  . MICROLARYNGOSCOPY W/VOCAL CORD INJECTION N/A 03/05/2015   Procedure: MICROLARYNGOSCOPY WITH VOCAL CORD INJECTION;  Surgeon: Carloyn Manner, MD;  Location: [facility] ORS;  Service: ENT;  Laterality: N/A;    SOCIAL HISTORY:   Social History  Substance Use Topics  . Smoking status: Never Smoker  . Smokeless tobacco: Never Used  . Alcohol use No    FAMILY HISTORY:   Family History  Problem Relation Age of Onset  . Heart attack Mother   . Heart attack Father   . Heart attack Maternal Grandfather   . Heart attack Paternal Grandfather     DRUG ALLERGIES:   Allergies  Allergen Reactions  . Carvedilol Other (See Comments)    Hallucinations     REVIEW OF SYSTEMS:   Review of Systems  Constitutional: Positive for malaise/fatigue. Negative for chills and fever.  HENT: Negative for congestion.   Eyes: Negative for blurred vision and double vision.  Respiratory: Positive for shortness of breath. Negative for cough, hemoptysis, wheezing and stridor.   Cardiovascular: Negative for chest pain, palpitations, orthopnea and leg swelling.  Gastrointestinal: Negative for abdominal pain, blood in stool, diarrhea, melena, nausea and vomiting.  Genitourinary: Negative for dysuria and hematuria.  Musculoskeletal: Negative for joint pain.  Neurological: Negative for dizziness, focal weakness and loss of consciousness.  Psychiatric/Behavioral: Negative for depression. The patient is not nervous/anxious.     MEDICATIONS AT HOME:  Prior to Admission medications   Medication Sig Start Date End Date Taking? Authorizing Provider  amiodarone (PACERONE) 100 MG tablet Take 100 mg by mouth every morning.    Yes Historical Provider, MD  aspirin EC 81 MG tablet Take 162 mg by mouth every morning.   Yes Historical Provider, MD  dorzolamide (TRUSOPT) 2 % ophthalmic solution Place into both eyes 2 (two) times  daily.   Yes Historical Provider, MD  furosemide (LASIX) 20 MG tablet Take 20 mg by mouth every morning.   Yes Historical Provider, MD  latanoprost (XALATAN) 0.005 % ophthalmic solution Place 1 drop into both eyes at bedtime.   Yes Historical Provider, MD  losartan (COZAAR) 50 MG tablet Take 50 mg by mouth every morning.   Yes Historical Provider, MD  pantoprazole (PROTONIX) 40 MG tablet Take 40 mg by mouth daily.   Yes Historical Provider, MD  simvastatin (ZOCOR) 40 MG tablet Take 40 mg by mouth daily.    Yes Historical Provider, MD  timolol (TIMOPTIC) 0.5 % ophthalmic solution Place 1 drop into both eyes 2 (two) times daily.   Yes Historical Provider, MD  meloxicam (MOBIC) 7.5 MG tablet Take 7.5 mg by mouth daily.    Historical Provider, MD      VITAL SIGNS:  Blood pressure 112/75, pulse (!) 58, temperature 98.3 F (36.8 C), temperature source Oral, resp. rate 17, height 6\' 1"  (1.854 m), weight 222 lb (100.7 kg), SpO2 96 %.  PHYSICAL EXAMINATION:  Physical Exam  GENERAL:  75 y.o.-year-old patient lying in the bed with no acute distress.  EYES: Pupils equal, round, reactive to light and accommodation. No scleral icterus. Extraocular muscles intact.  HEENT: Head atraumatic, normocephalic. Oropharynx and nasopharynx clear. Moist oral mucosa. NECK:  Supple, no jugular venous distention. No thyroid enlargement, no tenderness.  LUNGS: Normal breath sounds bilaterally, no wheezing, rales,rhonchi or crepitation. No use of accessory muscles of respiration.  CARDIOVASCULAR: S1, S2 normal. No murmurs, rubs, or gallops.  ABDOMEN: Soft, nontender, nondistended. Bowel sounds present. No organomegaly or mass.  EXTREMITIES: No pedal edema, cyanosis, or clubbing.  NEUROLOGIC: Cranial nerves II through XII are intact. Muscle strength 5/5 in all extremities. Sensation intact. Gait not checked.  PSYCHIATRIC: The patient is alert and oriented x 3.  SKIN: No obvious rash, lesion, or ulcer.   LABORATORY  PANEL:   CBC  Recent Labs Lab 03/15/16 1458  WBC 7.8  HGB 14.1  HCT 41.2  PLT 207   ------------------------------------------------------------------------------------------------------------------  Chemistries   Recent Labs Lab 03/15/16 1458  NA 136  K 3.7  CL 101  CO2 26  GLUCOSE 138*  BUN 18  CREATININE 1.72*  CALCIUM 8.8*  AST 26  ALT 18  ALKPHOS 69  BILITOT 0.9   ------------------------------------------------------------------------------------------------------------------  Cardiac Enzymes  Recent Labs Lab 03/15/16 1458  TROPONINI <0.03   ------------------------------------------------------------------------------------------------------------------  RADIOLOGY:  Ct Angio Chest Pe W Or Wo Contrast  Addendum Date: 03/15/2016   ADDENDUM REPORT: 03/15/2016 14:37 ADDENDUM: Critical Value/emergent results were called by telephone at the time of interpretation on 03/15/2016 at 1413 hours to Dr. Bartholome Bill , who verbally acknowledged these results. Electronically Signed   By: Genevie Ann M.D.   On: 03/15/2016 14:37   Result Date: 03/15/2016 CLINICAL DATA:  75 year old male with left chest pain and shortness of breath for 3 weeks. Initial encounter. EXAM: CT ANGIOGRAPHY CHEST WITH CONTRAST TECHNIQUE: Multidetector CT imaging of the chest was performed using the standard protocol during bolus administration of intravenous contrast.  Multiplanar CT image reconstructions and MIPs were obtained to evaluate the vascular anatomy. CONTRAST:  60 mL Isovue 370. COMPARISON:  Chest radiographs 02/24/2016 and earlier. FINDINGS: Cardiovascular: Good contrast bolus timing in the pulmonary arterial tree. Nonocclusive filling defects in the left lingula and left lower lobe pulmonary arteries (E.g. Series 5, image 116). No saddle or central pulmonary embolus. Occlusive thrombus in the medial and posterior basal right lower lobe pulmonary artery segments (series 5, image 158). Right  middle lobe and right upper lobe are relatively spared. Cardiomegaly with dilated left ventricle. RV / LV ratio is less than 0.8. No pericardial effusion. Left chest cardiac AICD. No contrast in the aorta. Calcified coronary artery atherosclerosis. Mediastinum/Nodes: No lymphadenopathy. Lungs/Pleura: Mild scarring along the anterior trachea at the thoracic inlet might reflect sequelae of prior tracheostomy, uncertain. Major airways are patent. Lungs are clear aside from a 5 mm left upper lobe peripheral ground-glass nodule on series 6, image 28, and a small calcified granuloma in the anterior left lower lobe on image 59. No pleural effusion. Upper Abdomen: Negative visualized liver, gallbladder, spleen, pancreas, and adrenal glands in the upper abdomen. Negative visible bowel aside from mild diverticulosis in the colon. Multiple exophytic low-density renal lesions, the largest with simple fluid densitometry. There is calcified atherosclerosis of the abdominal aorta. Musculoskeletal: Occasional chronic rib fractures. No acute osseous abnormality identified. Review of the MIP images confirms the above findings. IMPRESSION: 1. Positive for bilateral pulmonary emboli. Occlusive thrombus in the right lower lobe pulmonary arteries. Nonocclusive thrombus in the lingula and left lower lobe pulmonary arteries. 2. Negative for pulmonary infarct or pleural effusion. 3. Calcified coronary artery atherosclerosis and abdominal aortic atherosclerosis. 4. Cardiomegaly with left ventricular enlargement. No pericardial effusion. 5. Single left upper lobe 5 mm ground-glass lung nodule. No follow-up recommended. This recommendation follows the consensus statement: Guidelines for Management of Incidental Pulmonary Nodules Detected on CT Images: From the Fleischner Society 2017; Radiology 2017; 284:228-243. Electronically Signed: By: Genevie Ann M.D. On: 03/15/2016 13:59      IMPRESSION AND PLAN:   Bilateral PE The patient will be  admitted to telemetry floor. Start heparin drip, get bilateral lower extremity venous duplex. The patient's just got a echocardiograph on Nov. 30. I will not order echocardiograph but cardiology consult.  Chronic systolic CHF. Stable. Continue Lasix,  CKD stage III. Stable  All the records are reviewed and case discussed with ED provider. Management plans discussed with the patient, his wife and they are in agreement.  CODE STATUS: Full code  TOTAL TIME TAKING CARE OF THIS PATIENT: 53 minutes.    Demetrios Loll M.D on 03/15/2016 at 4:37 PM  Between 7am to 6pm - Pager - [phone]  After 6pm go to [email or web address] - [employer] [facility]  Office  [phone]  CC: Primary care physician; FITZGERALD, DAVID Mamie Nick, MD   Note: This dictation was prepared with Dragon dictation along with smaller phrase technology. Any transcriptional errors that result from this process are unintentional.

## 2016-03-15 NOTE — ED Notes (Signed)
ED Provider at bedside. 

## 2016-03-16 ENCOUNTER — Inpatient Hospital Stay: Payer: Medicare Other

## 2016-03-16 LAB — BASIC METABOLIC PANEL
Anion gap: 5 (ref 5–15)
BUN: 19 mg/dL (ref 6–20)
CALCIUM: 8.4 mg/dL — AB (ref 8.9–10.3)
CHLORIDE: 106 mmol/L (ref 101–111)
CO2: 25 mmol/L (ref 22–32)
CREATININE: 1.69 mg/dL — AB (ref 0.61–1.24)
GFR calc non Af Amer: 38 mL/min — ABNORMAL LOW (ref >60)
GFR, EST AFRICAN AMERICAN: 45 mL/min — AB (ref >60)
Glucose, Bld: 95 mg/dL (ref 65–99)
Potassium: 3.7 mmol/L (ref 3.5–5.1)
SODIUM: 136 mmol/L (ref 135–145)

## 2016-03-16 LAB — CBC
HCT: 36.9 % — ABNORMAL LOW (ref 40.0–52.0)
Hemoglobin: 13 g/dL (ref 13.0–18.0)
MCH: 34.2 pg — AB (ref 26.0–34.0)
MCHC: 35.1 g/dL (ref 32.0–36.0)
MCV: 97.3 fL (ref 80.0–100.0)
Platelets: 171 10*3/uL (ref 150–440)
RBC: 3.79 MIL/uL — ABNORMAL LOW (ref 4.40–5.90)
RDW: 14.5 % (ref 11.5–14.5)
WBC: 7.2 10*3/uL (ref 3.8–10.6)

## 2016-03-16 LAB — HEPARIN LEVEL (UNFRACTIONATED): HEPARIN UNFRACTIONATED: 1.33 [IU]/mL — AB (ref 0.30–0.70)

## 2016-03-16 MED ORDER — ATORVASTATIN CALCIUM 20 MG PO TABS
20.0000 mg | ORAL_TABLET | Freq: Every day | ORAL | Status: DC
  Administered 2016-03-16 – 2016-03-17 (×2): 20 mg via ORAL
  Filled 2016-03-16 (×2): qty 1

## 2016-03-16 MED ORDER — ASPIRIN 81 MG PO CHEW
81.0000 mg | CHEWABLE_TABLET | Freq: Every day | ORAL | Status: DC
  Administered 2016-03-17 – 2016-03-18 (×2): 81 mg via ORAL
  Filled 2016-03-16 (×2): qty 1

## 2016-03-16 MED ORDER — RIVAROXABAN 20 MG PO TABS: 20.0000 mg | ORAL_TABLET | Freq: Every day | ORAL | Status: DC

## 2016-03-16 MED ORDER — RIVAROXABAN (XARELTO) EDUCATION KIT FOR DVT/PE PATIENTS
PACK | Freq: Once | Status: AC
Start: 2016-03-16 — End: 2016-03-17
  Administered 2016-03-17: 10:00:00
  Filled 2016-03-16: qty 1

## 2016-03-16 MED ORDER — RIVAROXABAN 15 MG PO TABS
15.0000 mg | ORAL_TABLET | Freq: Two times a day (BID) | ORAL | Status: DC
  Administered 2016-03-16 – 2016-03-18 (×5): 15 mg via ORAL
  Filled 2016-03-16 (×5): qty 1

## 2016-03-16 MED ORDER — HEPARIN (PORCINE) IN NACL 100-0.45 UNIT/ML-% IJ SOLN
1350.0000 [IU]/h | INTRAMUSCULAR | Status: DC
  Administered 2016-03-16 (×2): 1350 [IU]/h via INTRAVENOUS

## 2016-03-16 NOTE — Consult Note (Signed)
[facility] Cardiology  CARDIOLOGY CONSULT NOTE  Patient ID: Jerry English MRN: [id number] DOB/AGE: 73-Dec-1942 73 y.o.  Admit date: 03/15/2016 Referring Physician Posey Pronto Primary Physician [facility] Primary Cardiologist Fath Reason for Consultation Shortness of breath  HPI: 75 year old gentleman referred for shortness of breath. The patient has a history of CAD, chronic systolic heart failure with recent echocardiogram on 02/25/16 revealing EF 35-40% with biventricular pacemaker, atrial fibrillation, myocardial infarction, and a recent diagnosis of pulmonary emboli. Patient states that he was in his usual state of health 3 weeks ago when he began to develop exertional shortness of breath and lightheadedness while taking a walk. He was seen in the ER on 02/24/16 and found to have troponin of 0.06. Cardiac catheterization revealed insignificant disease with less than 75% stenosis in his coronaries. Patient was discharged with a future order for a chest CT , which revealed bilateral pulmonary emboli. Patient was readmitted to the hospital, started on heparin drip, which was later stopped, Xarelto added. Admission labs notable for negative troponin. Doppler US of lower extremities was negative for DVT. Currently, the patient denies chest pain or shortness of breath. He does complain of exertional dyspnea. He denies lower extremity edema or pain. He denies experiencing palpitations, heart racing, or orthopnea.  Review of systems complete and found to be negative unless listed above     Past Medical History:  Diagnosis Date  . CHF (congestive heart failure) ([place])   . Glaucoma   . Hypertension   . Myocardial infarction   . Presence of combination internal cardiac defibrillator (ICD) and pacemaker   . Presence of permanent cardiac pacemaker   . Seizures ([place])     Past Surgical History:  Procedure Laterality Date  . BRAIN SURGERY    . CARDIAC CATHETERIZATION    . CARDIAC CATHETERIZATION N/A 02/25/2016    Procedure: Left Heart Cath and Coronary Angiography;  Surgeon: Teodoro Spray, MD;  Location: [place] CV LAB;  Service: Cardiovascular;  Laterality: N/A;  . COLONOSCOPY WITH PROPOFOL N/A 10/02/2014   Procedure: COLONOSCOPY WITH PROPOFOL;  Surgeon: Hulen Luster, MD;  Location: [place] ENDOSCOPY;  Service: Gastroenterology;  Laterality: N/A;  . ESOPHAGOGASTRODUODENOSCOPY (EGD) WITH PROPOFOL N/A 10/02/2014   Procedure: ESOPHAGOGASTRODUODENOSCOPY (EGD) WITH PROPOFOL;  Surgeon: Hulen Luster, MD;  Location: [facility] ENDOSCOPY;  Service: Gastroenterology;  Laterality: N/A;  . INSERT / REPLACE / REMOVE PACEMAKER    . JOINT REPLACEMENT     left knee  . MICROLARYNGOSCOPY W/VOCAL CORD INJECTION N/A 03/05/2015   Procedure: MICROLARYNGOSCOPY WITH VOCAL CORD INJECTION;  Surgeon: Carloyn Manner, MD;  Location: [facility] ORS;  Service: ENT;  Laterality: N/A;    Prescriptions Prior to Admission  Medication Sig Dispense Refill Last Dose  . amiodarone (PACERONE) 100 MG tablet Take 100 mg by mouth every morning.    03/15/2016 at 0900  . aspirin EC 81 MG tablet Take 162 mg by mouth every morning.   03/15/2016 at 0900  . dorzolamide (TRUSOPT) 2 % ophthalmic solution Place into both eyes 2 (two) times daily.   03/15/2016 at 0900  . furosemide (LASIX) 20 MG tablet Take 20 mg by mouth every morning.   03/15/2016 at 0900  . latanoprost (XALATAN) 0.005 % ophthalmic solution Place 1 drop into both eyes at bedtime.   03/15/2016 at 0900  . losartan (COZAAR) 50 MG tablet Take 50 mg by mouth every morning.   03/15/2016 at 0900  . pantoprazole (PROTONIX) 40 MG tablet Take 40 mg by mouth daily.   03/15/2016  at 0900  . simvastatin (ZOCOR) 40 MG tablet Take 40 mg by mouth daily.    03/15/2016 at 0900  . timolol (TIMOPTIC) 0.5 % ophthalmic solution Place 1 drop into both eyes 2 (two) times daily.   03/15/2016 at 0900  . meloxicam (MOBIC) 7.5 MG tablet Take 7.5 mg by mouth daily.   Not Taking at Unknown time   Social History   Social  History  . Marital status: Married    Spouse name: N/A  . Number of children: N/A  . Years of education: N/A   Occupational History  . Not on file.   Social History Main Topics  . Smoking status: Never Smoker  . Smokeless tobacco: Never Used  . Alcohol use No  . Drug use: No  . Sexual activity: Not on file   Other Topics Concern  . Not on file   Social History Narrative  . No narrative on file    Family History  Problem Relation Age of Onset  . Heart attack Mother   . Heart attack Father   . Heart attack Maternal Grandfather   . Heart attack Paternal Grandfather       Review of systems complete and found to be negative unless listed above      PHYSICAL EXAM  General: Well developed, well nourished, in no acute distress HEENT:  Normocephalic and atramatic Neck:  No JVD.  Lungs: Clear bilaterally to auscultation and percussion. Heart: HRRR . Normal S1 and S2 without gallops or murmurs.  Abdomen: Bowel sounds are positive, abdomen soft and non-tender  Msk:  Back normal, normal gait. Normal strength and tone for age. Extremities: No clubbing, cyanosis or edema.   Neuro: Alert and oriented X 3. Psych:  Good affect, responds appropriately  Labs:   Lab Results  Component Value Date   WBC 7.2 03/16/2016   HGB 13.0 03/16/2016   HCT 36.9 (L) 03/16/2016   MCV 97.3 03/16/2016   PLT 171 03/16/2016    Recent Labs Lab 03/15/16 1458 03/16/16 0031  NA 136 136  K 3.7 3.7  CL 101 106  CO2 26 25  BUN 18 19  CREATININE 1.72* 1.69*  CALCIUM 8.8* 8.4*  PROT 6.9  --   BILITOT 0.9  --   ALKPHOS 69  --   ALT 18  --   AST 26  --   GLUCOSE 138* 95   Lab Results  Component Value Date   CKTOTAL 199 07/12/2012   CKMB 1.6 07/12/2012   TROPONINI <0.03 03/15/2016   No results found for: CHOL No results found for: HDL No results found for: LDLCALC No results found for: TRIG No results found for: CHOLHDL No results found for: LDLDIRECT    Radiology: Dg Chest 2  View  Result Date: 02/24/2016 CLINICAL DATA:  Shortness of Breath EXAM: CHEST  2 VIEW COMPARISON:  08/09/2014 FINDINGS: Cardiac shadow is within normal limits. A defibrillator is again seen. Lungs are clear bilaterally. No acute bony abnormality is seen. IMPRESSION: No active cardiopulmonary disease. Electronically Signed   By: Inez Catalina M.D.   On: 02/24/2016 19:28   Ct Angio Chest Pe W Or Wo Contrast  Addendum Date: 03/15/2016   ADDENDUM REPORT: 03/15/2016 14:37 ADDENDUM: Critical Value/emergent results were called by telephone at the time of interpretation on 03/15/2016 at 1413 hours to Dr. Bartholome Bill , who verbally acknowledged these results. Electronically Signed   By: Genevie Ann M.D.   On: 03/15/2016 14:37   Result Date:  03/15/2016 CLINICAL DATA:  75 year old male with left chest pain and shortness of breath for 3 weeks. Initial encounter. EXAM: CT ANGIOGRAPHY CHEST WITH CONTRAST TECHNIQUE: Multidetector CT imaging of the chest was performed using the standard protocol during bolus administration of intravenous contrast. Multiplanar CT image reconstructions and MIPs were obtained to evaluate the vascular anatomy. CONTRAST:  60 mL Isovue 370. COMPARISON:  Chest radiographs 02/24/2016 and earlier. FINDINGS: Cardiovascular: Good contrast bolus timing in the pulmonary arterial tree. Nonocclusive filling defects in the left lingula and left lower lobe pulmonary arteries (E.g. Series 5, image 116). No saddle or central pulmonary embolus. Occlusive thrombus in the medial and posterior basal right lower lobe pulmonary artery segments (series 5, image 158). Right middle lobe and right upper lobe are relatively spared. Cardiomegaly with dilated left ventricle. RV / LV ratio is less than 0.8. No pericardial effusion. Left chest cardiac AICD. No contrast in the aorta. Calcified coronary artery atherosclerosis. Mediastinum/Nodes: No lymphadenopathy. Lungs/Pleura: Mild scarring along the anterior trachea at the  thoracic inlet might reflect sequelae of prior tracheostomy, uncertain. Major airways are patent. Lungs are clear aside from a 5 mm left upper lobe peripheral ground-glass nodule on series 6, image 28, and a small calcified granuloma in the anterior left lower lobe on image 59. No pleural effusion. Upper Abdomen: Negative visualized liver, gallbladder, spleen, pancreas, and adrenal glands in the upper abdomen. Negative visible bowel aside from mild diverticulosis in the colon. Multiple exophytic low-density renal lesions, the largest with simple fluid densitometry. There is calcified atherosclerosis of the abdominal aorta. Musculoskeletal: Occasional chronic rib fractures. No acute osseous abnormality identified. Review of the MIP images confirms the above findings. IMPRESSION: 1. Positive for bilateral pulmonary emboli. Occlusive thrombus in the right lower lobe pulmonary arteries. Nonocclusive thrombus in the lingula and left lower lobe pulmonary arteries. 2. Negative for pulmonary infarct or pleural effusion. 3. Calcified coronary artery atherosclerosis and abdominal aortic atherosclerosis. 4. Cardiomegaly with left ventricular enlargement. No pericardial effusion. 5. Single left upper lobe 5 mm ground-glass lung nodule. No follow-up recommended. This recommendation follows the consensus statement: Guidelines for Management of Incidental Pulmonary Nodules Detected on CT Images: From the Fleischner Society 2017; Radiology 2017; 284:228-243. Electronically Signed: By: Genevie Ann M.D. On: 03/15/2016 13:59   US Venous Img Lower Bilateral  Result Date: 03/16/2016 CLINICAL DATA:  Pulmonary embolism EXAM: BILATERAL LOWER EXTREMITY VENOUS DUPLEX ULTRASOUND TECHNIQUE: Doppler venous assessment of the bilateral lower extremity deep venous system was performed, including characterization of spectral flow, compressibility, and phasicity. COMPARISON:  None. FINDINGS: There is complete compressibility of the bilateral common  femoral, femoral, and popliteal veins. Doppler analysis demonstrates respiratory phasicity and augmentation of flow with calf compression. No obvious superficial vein or calf vein thrombosis. Left popliteal fossa Baker's cyst measures 4.1 x 3.9 x 1.9 cm. IMPRESSION: No evidence of DVT in the lower extremities. Left knee Baker's cyst. Electronically Signed   By: Marybelle Killings M.D.   On: 03/16/2016 09:01    EKG: paced sinus rhythm, rate controlled, 62 bpm  ASSESSMENT AND PLAN:  1. Exertional shortness of breath, most likely due to pulmonary emboli, and less likely CHF. Patient clinically stable and does not appear to be fluid-overloaded. 2. Paroxysmal atrial fibrillation, currently paced sinus rhythm, rate controlled. On Xarelto for pulmonary emboli. 3. Chronic systolic congestive heart failure with most recent echo revealing EF 35-40%. Patient does not appear fluid-overloaded.   RECOMMENDATIONS:  1. Agree with current therapy. 2. Continue Xarelto for both pulmonary emboli  and for stroke prevention in atrial fibrillation. 3. Lasix as needed. 4. No further cardiac diagnostics at this time.  5. Follow-up Dr. Ubaldo Glassing as outpatient.  Sign off for now; call if any questions.  Signed: Clabe Seal, PA-C 03/16/2016, 1:51 PM

## 2016-03-16 NOTE — Plan of Care (Signed)
Problem: Skin Integrity: Goal: Risk for impaired skin integrity will decrease Outcome: Progressing Patient able to reposition himself. Skin intact. Will monitor.

## 2016-03-16 NOTE — Care Management (Signed)
Patient admitted from home with bilateral pulmonary embolus.  No evidence of DVT.  Patient currently on heparin drip and will be transitioned to Xarelto.  During progression informed patient is not requiring supplemental 02.  Independent in his adls and no issues accessing medical care.  Patient confirmed that patient has pharmacy coverage with his medicare plan.  Provided patient with 30 day Xarelto coupon.

## 2016-03-16 NOTE — Progress Notes (Signed)
Pharmacist - Prescriber Communication  Simvastatin 40 mg po daily has been changed to atorvastatin 20 mg po daily to reduce risk of drug-drug interaction with amiodarone.  Lynnann Knudsen A. Morse, [place].D., BCPS Clinical Pharmacist 03/16/2016 [phone]

## 2016-03-16 NOTE — Progress Notes (Addendum)
ANTICOAGULATION CONSULT NOTE - Initial Consult  Pharmacy Consult for Rivaroxaban Indication: pulmonary embolus  Allergies  Allergen Reactions  . Carvedilol Other (See Comments)    Hallucinations     Patient Measurements: Height: 6\' 1"  (185.4 cm) Weight: 222 lb (100.7 kg) IBW/kg (Calculated) : 79.9  Vital Signs: Temp: 99.1 F (37.3 C) (12/20 0927) Temp Source: Oral (12/20 0927) BP: 106/66 (12/20 0927) Pulse Rate: 61 (12/20 0927)  Labs:  Recent Labs  03/15/16 1458 03/16/16 0031  HGB 14.1 13.0  HCT 41.2 36.9*  PLT 207 171  APTT 30  --   LABPROT 13.4  --   INR 1.02  --   HEPARINUNFRC  --  1.33*  CREATININE 1.72* 1.69*  TROPONINI <0.03  --     Estimated Creatinine Clearance: 48.6 mL/min (by C-G formula based on SCr of 1.69 mg/dL (H)).   Medical History: Past Medical History:  Diagnosis Date  . CHF (congestive heart failure) ([place])   . Glaucoma   . Hypertension   . Myocardial infarction   . Presence of combination internal cardiac defibrillator (ICD) and pacemaker   . Presence of permanent cardiac pacemaker   . Seizures [facility])     Assessment: 75 y/o M with a h/o CP and SOB x 3 weeks admitted with B/L PE and started on heparin drip with orders to transition patient to rivaroxaban today.   Plan:  Instructed RN to d/c heparin drip at noon when initial dose of rivaroxaban will be administered. Patient is to take rivaroxaban 15 mg bid with food x 21 days then begin 20 mg daily with supper. Dose transition scheduled for 04/06/16. Attempted to counsel but patient sleeping. Wife will inform RN when to return to counsel.   Ulice Dash D 03/16/2016,10:30 AM

## 2016-03-16 NOTE — Progress Notes (Signed)
[place] at [facility]                                                                                                                                                                                  Patient Demographics   Jerry English, is a 75 y.o. male, DOB - 1941/02/06, [id number]  Admit date - 03/15/2016   Admitting Physician Demetrios Loll, MD  Outpatient Primary MD for the patient is FITZGERALD, DAVID Mamie Nick, MD   LOS - 1  Subjective: Patient Admitted with pulmonary embolism is still has some shortness of breath but otherwise feeling better.    Review of Systems:   CONSTITUTIONAL: No documented fever. No fatigue, weakness. No weight gain, no weight loss.  EYES: No blurry or double vision.  ENT: No tinnitus. No postnasal drip. No redness of the oropharynx.  RESPIRATORY: No cough, no wheeze, no hemoptysis. Positive dyspnea.  CARDIOVASCULAR: No chest pain. No orthopnea. No palpitations. No syncope.  GASTROINTESTINAL: No nausea, no vomiting or diarrhea. No abdominal pain. No melena or hematochezia.  GENITOURINARY: No dysuria or hematuria.  ENDOCRINE: No polyuria or nocturia. No heat or cold intolerance.  HEMATOLOGY: No anemia. No bruising. No bleeding.  INTEGUMENTARY: No rashes. No lesions.  MUSCULOSKELETAL: No arthritis. No swelling. No gout.  NEUROLOGIC: No numbness, tingling, or ataxia. No seizure-type activity.  PSYCHIATRIC: No anxiety. No insomnia. No ADD.    Vitals:   Vitals:   03/15/16 2300 03/16/16 0438 03/16/16 0927 03/16/16 1300  BP:  (!) 105/58 106/66 112/67  Pulse:  62 61 (!) 59  Resp:  18  18  Temp: (!) 101 F (38.3 C) 100.3 F (37.9 C) 99.1 F (37.3 C) 99.7 F (37.6 C)  TempSrc: Oral Oral Oral Oral  SpO2:  95%  95%  Weight:      Height:        Wt Readings from Last 3 Encounters:  03/15/16 222 lb (100.7 kg)  02/26/16 219 lb 8 oz (99.6 kg)  03/05/15 226 lb (102.5 kg)     Intake/Output Summary (Last 24 hours) at 03/16/16  1535 Last data filed at 03/16/16 1421  Gross per 24 hour  Intake              459 ml  Output              650 ml  Net             -191 ml    Physical Exam:   GENERAL: Pleasant-appearing in no apparent distress.  HEAD, EYES, EARS, NOSE AND THROAT: Atraumatic, normocephalic. Extraocular muscles are intact. Pupils equal and reactive to light. Sclerae anicteric. No conjunctival injection.  No oro-pharyngeal erythema.  NECK: Supple. There is no jugular venous distention. No bruits, no lymphadenopathy, no thyromegaly.  HEART: Regular rate and rhythm,. No murmurs, no rubs, no clicks.  LUNGS: Clear to auscultation bilaterally. No rales or rhonchi. No wheezes.  ABDOMEN: Soft, flat, nontender, nondistended. Has good bowel sounds. No hepatosplenomegaly appreciated.  EXTREMITIES: No evidence of any cyanosis, clubbing, or peripheral edema.  +2 pedal and radial pulses bilaterally.  NEUROLOGIC: The patient is alert, awake, and oriented x3 with no focal motor or sensory deficits appreciated bilaterally.  SKIN: Moist and warm with no rashes appreciated.  Psych: Not anxious, depressed LN: No inguinal LN enlargement    Antibiotics   Anti-infectives    None      Medications   Scheduled Meds: . amiodarone  100 mg Oral BH-q7a  . aspirin  81 mg Oral Daily  . atorvastatin  20 mg Oral q1800  . dorzolamide  1 drop Both Eyes BID  . furosemide  20 mg Oral BH-q7a  . latanoprost  1 drop Both Eyes QHS  . pantoprazole  40 mg Oral Daily  . rivaroxaban   Does not apply Once  . Rivaroxaban  15 mg Oral BID WC  . [START ON 04/06/2016] rivaroxaban  20 mg Oral Q supper  . sodium chloride flush  3 mL Intravenous Q12H  . sodium chloride flush  3 mL Intravenous Q12H  . timolol  1 drop Both Eyes BID   Continuous Infusions: PRN Meds:.sodium chloride, acetaminophen **OR** acetaminophen, albuterol, HYDROcodone-acetaminophen, ondansetron **OR** ondansetron (ZOFRAN) IV, sodium chloride flush   Data Review:    Micro Results No results found for this or any previous visit (from the past 240 hour(s)).  Radiology Reports Dg Chest 2 View  Result Date: 02/24/2016 CLINICAL DATA:  Shortness of Breath EXAM: CHEST  2 VIEW COMPARISON:  08/09/2014 FINDINGS: Cardiac shadow is within normal limits. A defibrillator is again seen. Lungs are clear bilaterally. No acute bony abnormality is seen. IMPRESSION: No active cardiopulmonary disease. Electronically Signed   By: Inez Catalina M.D.   On: 02/24/2016 19:28   Ct Angio Chest Pe W Or Wo Contrast  Addendum Date: 03/15/2016   ADDENDUM REPORT: 03/15/2016 14:37 ADDENDUM: Critical Value/emergent results were called by telephone at the time of interpretation on 03/15/2016 at 1413 hours to Dr. Bartholome Bill , who verbally acknowledged these results. Electronically Signed   By: Genevie Ann M.D.   On: 03/15/2016 14:37   Result Date: 03/15/2016 CLINICAL DATA:  75 year old male with left chest pain and shortness of breath for 3 weeks. Initial encounter. EXAM: CT ANGIOGRAPHY CHEST WITH CONTRAST TECHNIQUE: Multidetector CT imaging of the chest was performed using the standard protocol during bolus administration of intravenous contrast. Multiplanar CT image reconstructions and MIPs were obtained to evaluate the vascular anatomy. CONTRAST:  60 mL Isovue 370. COMPARISON:  Chest radiographs 02/24/2016 and earlier. FINDINGS: Cardiovascular: Good contrast bolus timing in the pulmonary arterial tree. Nonocclusive filling defects in the left lingula and left lower lobe pulmonary arteries (E.g. Series 5, image 116). No saddle or central pulmonary embolus. Occlusive thrombus in the medial and posterior basal right lower lobe pulmonary artery segments (series 5, image 158). Right middle lobe and right upper lobe are relatively spared. Cardiomegaly with dilated left ventricle. RV / LV ratio is less than 0.8. No pericardial effusion. Left chest cardiac AICD. No contrast in the aorta. Calcified  coronary artery atherosclerosis. Mediastinum/Nodes: No lymphadenopathy. Lungs/Pleura: Mild scarring along the anterior trachea at the thoracic inlet  might reflect sequelae of prior tracheostomy, uncertain. Major airways are patent. Lungs are clear aside from a 5 mm left upper lobe peripheral ground-glass nodule on series 6, image 28, and a small calcified granuloma in the anterior left lower lobe on image 59. No pleural effusion. Upper Abdomen: Negative visualized liver, gallbladder, spleen, pancreas, and adrenal glands in the upper abdomen. Negative visible bowel aside from mild diverticulosis in the colon. Multiple exophytic low-density renal lesions, the largest with simple fluid densitometry. There is calcified atherosclerosis of the abdominal aorta. Musculoskeletal: Occasional chronic rib fractures. No acute osseous abnormality identified. Review of the MIP images confirms the above findings. IMPRESSION: 1. Positive for bilateral pulmonary emboli. Occlusive thrombus in the right lower lobe pulmonary arteries. Nonocclusive thrombus in the lingula and left lower lobe pulmonary arteries. 2. Negative for pulmonary infarct or pleural effusion. 3. Calcified coronary artery atherosclerosis and abdominal aortic atherosclerosis. 4. Cardiomegaly with left ventricular enlargement. No pericardial effusion. 5. Single left upper lobe 5 mm ground-glass lung nodule. No follow-up recommended. This recommendation follows the consensus statement: Guidelines for Management of Incidental Pulmonary Nodules Detected on CT Images: From the Fleischner Society 2017; Radiology 2017; 284:228-243. Electronically Signed: By: Genevie Ann M.D. On: 03/15/2016 13:59   US Venous Img Lower Bilateral  Result Date: 03/16/2016 CLINICAL DATA:  Pulmonary embolism EXAM: BILATERAL LOWER EXTREMITY VENOUS DUPLEX ULTRASOUND TECHNIQUE: Doppler venous assessment of the bilateral lower extremity deep venous system was performed, including characterization of  spectral flow, compressibility, and phasicity. COMPARISON:  None. FINDINGS: There is complete compressibility of the bilateral common femoral, femoral, and popliteal veins. Doppler analysis demonstrates respiratory phasicity and augmentation of flow with calf compression. No obvious superficial vein or calf vein thrombosis. Left popliteal fossa Baker's cyst measures 4.1 x 3.9 x 1.9 cm. IMPRESSION: No evidence of DVT in the lower extremities. Left knee Baker's cyst. Electronically Signed   By: Marybelle Killings M.D.   On: 03/16/2016 09:01     CBC  Recent Labs Lab 03/15/16 1458 03/16/16 0031  WBC 7.8 7.2  HGB 14.1 13.0  HCT 41.2 36.9*  PLT 207 171  MCV 98.8 97.3  MCH 33.8 34.2*  MCHC 34.2 35.1  RDW 14.5 14.5    Chemistries   Recent Labs Lab 03/15/16 1458 03/16/16 0031  NA 136 136  K 3.7 3.7  CL 101 106  CO2 26 25  GLUCOSE 138* 95  BUN 18 19  CREATININE 1.72* 1.69*  CALCIUM 8.8* 8.4*  AST 26  --   ALT 18  --   ALKPHOS 69  --   BILITOT 0.9  --    ------------------------------------------------------------------------------------------------------------------ estimated creatinine clearance is 48.6 mL/min (by C-G formula based on SCr of 1.69 mg/dL (H)). ------------------------------------------------------------------------------------------------------------------ No results for input(s): HGBA1C in the last 72 hours. ------------------------------------------------------------------------------------------------------------------ No results for input(s): CHOL, HDL, LDLCALC, TRIG, CHOLHDL, LDLDIRECT in the last 72 hours. ------------------------------------------------------------------------------------------------------------------ No results for input(s): TSH, T4TOTAL, T3FREE, THYROIDAB in the last 72 hours.  Invalid input(s): FREET3 ------------------------------------------------------------------------------------------------------------------ No results for input(s):  VITAMINB12, FOLATE, FERRITIN, TIBC, IRON, RETICCTPCT in the last 72 hours.  Coagulation profile  Recent Labs Lab 03/15/16 1458  INR 1.02    No results for input(s): DDIMER in the last 72 hours.  Cardiac Enzymes  Recent Labs Lab 03/15/16 1458  TROPONINI <0.03   ------------------------------------------------------------------------------------------------------------------ Invalid input(s): POCBNP    Assessment & Plan  Patient is a 75 year old with bilateral pulmonary embolism 1. Bilateral PE I will stop his heparin therapy and started on Xarelto Dopplers  of the lower extremity reviewed and negative for DVT  2. Chronic systolic CHF. Stable. Continue Lasix,  3. CKD stage III. Stable  4. Essential hypertension blood pressure currently borderline discontinue losartan  5. Hyperlipidemia continue simvastatin    Code Status Orders        Start     Ordered   03/15/16 1714  Full code  Continuous     03/15/16 1713    Code Status History    Date Active Date Inactive Code Status Order ID Comments User Context   02/25/2016 12:26 AM 02/26/2016  1:50 PM Full Code [id number]  Lance Coon, MD Inpatient           Consults none   DVT Prophylaxis  Lovenox    Lab Results  Component Value Date   PLT 171 03/16/2016     Time Spent in minutes   27min  Greater than 50% of time spent in care coordination and counseling patient regarding the condition and plan of care.   Dustin Flock M.D on 03/16/2016 at 3:35 PM  Between 7am to 6pm - Pager - [phone]  After 6pm go to [email or web address] - password [email or web address] [facility]   Office  [phone]

## 2016-03-16 NOTE — Progress Notes (Signed)
ANTICOAGULATION CONSULT NOTE - Initial Consult  Pharmacy Consult for Heparin  Indication: Pulmonary embolism  Allergies  Allergen Reactions  . Carvedilol Other (See Comments)    Hallucinations     Patient Measurements: Height: 6\' 1"  (185.4 cm) Weight: 222 lb (100.7 kg) IBW/kg (Calculated) : 79.9 Heparin Dosing Weight:   Vital Signs: Temp: 101 F (38.3 C) (12/19 2300) Temp Source: Oral (12/19 2300) BP: 126/94 (12/19 1925) Pulse Rate: 67 (12/19 1925)  Labs:  Recent Labs  03/15/16 1458 03/16/16 0031  HGB 14.1 13.0  HCT 41.2 36.9*  PLT 207 171  APTT 30  --   LABPROT 13.4  --   INR 1.02  --   HEPARINUNFRC  --  1.33*  CREATININE 1.72* 1.69*  TROPONINI <0.03  --     Estimated Creatinine Clearance: 48.6 mL/min (by C-G formula based on SCr of 1.69 mg/dL (H)).   Medical History: Past Medical History:  Diagnosis Date  . CHF (congestive heart failure) ([place])   . Glaucoma   . Hypertension   . Myocardial infarction   . Presence of combination internal cardiac defibrillator (ICD) and pacemaker   . Presence of permanent cardiac pacemaker   . Seizures ([place])     Medications:  Prescriptions Prior to Admission  Medication Sig Dispense Refill Last Dose  . amiodarone (PACERONE) 100 MG tablet Take 100 mg by mouth every morning.    03/15/2016 at 0900  . aspirin EC 81 MG tablet Take 162 mg by mouth every morning.   03/15/2016 at 0900  . dorzolamide (TRUSOPT) 2 % ophthalmic solution Place into both eyes 2 (two) times daily.   03/15/2016 at 0900  . furosemide (LASIX) 20 MG tablet Take 20 mg by mouth every morning.   03/15/2016 at 0900  . latanoprost (XALATAN) 0.005 % ophthalmic solution Place 1 drop into both eyes at bedtime.   03/15/2016 at 0900  . losartan (COZAAR) 50 MG tablet Take 50 mg by mouth every morning.   03/15/2016 at 0900  . pantoprazole (PROTONIX) 40 MG tablet Take 40 mg by mouth daily.   03/15/2016 at 0900  . simvastatin (ZOCOR) 40 MG tablet Take 40 mg by mouth  daily.    03/15/2016 at 0900  . timolol (TIMOPTIC) 0.5 % ophthalmic solution Place 1 drop into both eyes 2 (two) times daily.   03/15/2016 at 0900  . meloxicam (MOBIC) 7.5 MG tablet Take 7.5 mg by mouth daily.   Not Taking at Unknown time   Scheduled:  . amiodarone  100 mg Oral BH-q7a  . aspirin EC  162 mg Oral BH-q7a  . atorvastatin  20 mg Oral q1800  . dorzolamide  1 drop Both Eyes BID  . furosemide  20 mg Oral BH-q7a  . latanoprost  1 drop Both Eyes QHS  . losartan  50 mg Oral BH-q7a  . pantoprazole  40 mg Oral Daily  . sodium chloride flush  3 mL Intravenous Q12H  . sodium chloride flush  3 mL Intravenous Q12H  . timolol  1 drop Both Eyes BID   Infusions:  . heparin      Assessment: Pharmacy consulted to dose and monitor heparin in this 75 year old male being treated for pulmonary embolism Goal of Therapy:  Heparin level 0.3-0.7 units/ml Monitor platelets by anticoagulation protocol: Yes   Plan:  Give 4000 units bolus x 1 Start heparin infusion at 1650 units/hr Check anti-Xa level in 8 hours and daily while on heparin Continue to monitor H&H and platelets  12/20 0031 HL supratherapeutic. Hold x 1 hr, then resume at 1350 units/hr. Will recheck HL 8 hours after restarting.  Laural Benes, Pharm.D., BCPS Clinical Pharmacist 03/16/2016,3:09 AM

## 2016-03-17 ENCOUNTER — Inpatient Hospital Stay: Payer: Medicare Other

## 2016-03-17 LAB — INFLUENZA PANEL BY PCR (TYPE A & B)
Influenza A By PCR: POSITIVE — AB
Influenza B By PCR: NEGATIVE

## 2016-03-17 MED ORDER — GUAIFENESIN-DM 100-10 MG/5ML PO SYRP
5.0000 mL | ORAL_SOLUTION | ORAL | Status: DC | PRN
  Administered 2016-03-17 – 2016-03-18 (×4): 5 mL via ORAL
  Filled 2016-03-17 (×4): qty 5

## 2016-03-17 MED ORDER — OSELTAMIVIR PHOSPHATE 75 MG PO CAPS
75.0000 mg | ORAL_CAPSULE | Freq: Two times a day (BID) | ORAL | Status: DC
  Administered 2016-03-17: 75 mg via ORAL
  Filled 2016-03-17 (×2): qty 1

## 2016-03-17 MED ORDER — OSELTAMIVIR PHOSPHATE 30 MG PO CAPS
30.0000 mg | ORAL_CAPSULE | Freq: Two times a day (BID) | ORAL | Status: DC
  Administered 2016-03-17 – 2016-03-18 (×2): 30 mg via ORAL
  Filled 2016-03-17 (×2): qty 1

## 2016-03-17 NOTE — Progress Notes (Signed)
Patient with fever, cough, sore throat, body aches.  Dr. Bridgett Larsson notified.  Order obtained to rule out flu.

## 2016-03-17 NOTE — Progress Notes (Signed)
[place] at [facility]                                                                                                                                                                                  Patient Demographics   Jerry English, is a 75 y.o. male, DOB - [birth year], [id number]  Admit date - 03/15/2016   Admitting Physician Demetrios Loll, MD  Outpatient Primary MD for the patient is FITZGERALD, DAVID Mamie Nick, MD   LOS - 2  Subjective: Patient has better shortness of breath but had fever 103, chills and body aching since last night. Influenza A test is positive.  Review of Systems:   CONSTITUTIONAL: has fever and weakness. No weight gain, no weight loss.  EYES: No blurry or double vision.  ENT: No tinnitus. No postnasal drip. No redness of the oropharynx.  RESPIRATORY: No cough, no wheeze, no hemoptysis. Positive dyspnea.  CARDIOVASCULAR: No chest pain. No orthopnea. No palpitations. No syncope.  GASTROINTESTINAL: No nausea, no vomiting or diarrhea. No abdominal pain. No melena or hematochezia.  GENITOURINARY: No dysuria or hematuria.  ENDOCRINE: No polyuria or nocturia. No heat or cold intolerance.  HEMATOLOGY: No anemia. No bruising. No bleeding.  INTEGUMENTARY: No rashes. No lesions.  MUSCULOSKELETAL: No arthritis. No swelling. No gout.  NEUROLOGIC: No numbness, tingling, or ataxia. No seizure-type activity.  PSYCHIATRIC: No anxiety. No insomnia. No ADD.    Vitals:   Vitals:   03/17/16 0333 03/17/16 0521 03/17/16 1018 03/17/16 1124  BP: 109/61  123/67 125/71  Pulse: 70  60 (!) 120  Resp: 18  18 18   Temp: (!) 103 F (39.4 C) 97.9 F (36.6 C) 100 F (37.8 C) 99.9 F (37.7 C)  TempSrc: Oral Oral Oral Oral  SpO2: 91%  95% 95%  Weight:      Height:        Wt Readings from Last 3 Encounters:  03/15/16 222 lb (100.7 kg)  02/26/16 219 lb 8 oz (99.6 kg)  03/05/15 226 lb (102.5 kg)     Intake/Output Summary (Last 24 hours) at 03/17/16  1743 Last data filed at 03/17/16 1735  Gross per 24 hour  Intake              720 ml  Output             2550 ml  Net            -1830 ml    Physical Exam:   GENERAL: Pleasant-appearing in no apparent distress.  HEAD, EYES, EARS, NOSE AND THROAT: Atraumatic, normocephalic. Extraocular muscles are intact. Pupils equal and reactive to light. Sclerae anicteric. No conjunctival injection. No  oro-pharyngeal erythema.  NECK: Supple. There is no jugular venous distention. No bruits, no lymphadenopathy, no thyromegaly.  HEART: Regular rate and rhythm,. No murmurs, no rubs, no clicks.  LUNGS: Clear to auscultation bilaterally. No rales or rhonchi. No wheezes.  ABDOMEN: Soft, flat, nontender, nondistended. Has good bowel sounds. No hepatosplenomegaly appreciated.  EXTREMITIES: No evidence of any cyanosis, clubbing, or peripheral edema.  +2 pedal and radial pulses bilaterally.  NEUROLOGIC: The patient is alert, awake, and oriented x3 with no focal motor or sensory deficits appreciated bilaterally.  SKIN: Moist and warm with no rashes appreciated.  Psych: Not anxious, depressed LN: No inguinal LN enlargement    Antibiotics   Anti-infectives    Start     Dose/Rate Route Frequency Ordered Stop   03/17/16 2200  oseltamivir (TAMIFLU) capsule 30 mg     30 mg Oral 2 times daily 03/17/16 1552 03/22/16 0959   03/17/16 1400  oseltamivir (TAMIFLU) capsule 75 mg  Status:  Discontinued     75 mg Oral 2 times daily 03/17/16 1245 03/17/16 1552      Medications   Scheduled Meds: . amiodarone  100 mg Oral BH-q7a  . aspirin  81 mg Oral Daily  . atorvastatin  20 mg Oral q1800  . dorzolamide  1 drop Both Eyes BID  . furosemide  20 mg Oral BH-q7a  . latanoprost  1 drop Both Eyes QHS  . oseltamivir  30 mg Oral BID  . pantoprazole  40 mg Oral Daily  . Rivaroxaban  15 mg Oral BID WC  . [START ON 04/06/2016] rivaroxaban  20 mg Oral Q supper  . sodium chloride flush  3 mL Intravenous Q12H  . sodium  chloride flush  3 mL Intravenous Q12H  . timolol  1 drop Both Eyes BID   Continuous Infusions: PRN Meds:.sodium chloride, acetaminophen **OR** acetaminophen, albuterol, guaiFENesin-dextromethorphan, HYDROcodone-acetaminophen, ondansetron **OR** ondansetron (ZOFRAN) IV, sodium chloride flush   Data Review:   Micro Results No results found for this or any previous visit (from the past 240 hour(s)).  Radiology Reports Dg Chest 1 View  Result Date: 03/17/2016 CLINICAL DATA:  Increase shortness of breath after admission. EXAM: CHEST 1 VIEW COMPARISON:  CT scan of the chest dated March 15, 2016 and chest x-ray of February 14, 2016. FINDINGS: The lungs are mildly hypoinflated. There is no focal infiltrate. There is no pleural effusion or pneumothorax. The heart and pulmonary vascularity are normal. There is calcification in the wall of the aortic arch. The ICD is in stable position. The bony thorax is unremarkable. IMPRESSION: There is no acute cardiopulmonary abnormality. Thoracic aortic atherosclerosis. Electronically Signed   By: David  Martinique M.D.   On: 03/17/2016 11:15   Dg Chest 2 View  Result Date: 02/24/2016 CLINICAL DATA:  Shortness of Breath EXAM: CHEST  2 VIEW COMPARISON:  08/09/2014 FINDINGS: Cardiac shadow is within normal limits. A defibrillator is again seen. Lungs are clear bilaterally. No acute bony abnormality is seen. IMPRESSION: No active cardiopulmonary disease. Electronically Signed   By: Inez Catalina M.D.   On: 02/24/2016 19:28   Ct Angio Chest Pe W Or Wo Contrast  Addendum Date: 03/15/2016   ADDENDUM REPORT: 03/15/2016 14:37 ADDENDUM: Critical Value/emergent results were called by telephone at the time of interpretation on 03/15/2016 at 1413 hours to Dr. Bartholome Bill , who verbally acknowledged these results. Electronically Signed   By: Genevie Ann M.D.   On: 03/15/2016 14:37   Result Date: 03/15/2016 CLINICAL DATA:  75 year old male  with left chest pain and shortness of  breath for 3 weeks. Initial encounter. EXAM: CT ANGIOGRAPHY CHEST WITH CONTRAST TECHNIQUE: Multidetector CT imaging of the chest was performed using the standard protocol during bolus administration of intravenous contrast. Multiplanar CT image reconstructions and MIPs were obtained to evaluate the vascular anatomy. CONTRAST:  60 mL Isovue 370. COMPARISON:  Chest radiographs 02/24/2016 and earlier. FINDINGS: Cardiovascular: Good contrast bolus timing in the pulmonary arterial tree. Nonocclusive filling defects in the left lingula and left lower lobe pulmonary arteries (E.g. Series 5, image 116). No saddle or central pulmonary embolus. Occlusive thrombus in the medial and posterior basal right lower lobe pulmonary artery segments (series 5, image 158). Right middle lobe and right upper lobe are relatively spared. Cardiomegaly with dilated left ventricle. RV / LV ratio is less than 0.8. No pericardial effusion. Left chest cardiac AICD. No contrast in the aorta. Calcified coronary artery atherosclerosis. Mediastinum/Nodes: No lymphadenopathy. Lungs/Pleura: Mild scarring along the anterior trachea at the thoracic inlet might reflect sequelae of prior tracheostomy, uncertain. Major airways are patent. Lungs are clear aside from a 5 mm left upper lobe peripheral ground-glass nodule on series 6, image 28, and a small calcified granuloma in the anterior left lower lobe on image 59. No pleural effusion. Upper Abdomen: Negative visualized liver, gallbladder, spleen, pancreas, and adrenal glands in the upper abdomen. Negative visible bowel aside from mild diverticulosis in the colon. Multiple exophytic low-density renal lesions, the largest with simple fluid densitometry. There is calcified atherosclerosis of the abdominal aorta. Musculoskeletal: Occasional chronic rib fractures. No acute osseous abnormality identified. Review of the MIP images confirms the above findings. IMPRESSION: 1. Positive for bilateral pulmonary  emboli. Occlusive thrombus in the right lower lobe pulmonary arteries. Nonocclusive thrombus in the lingula and left lower lobe pulmonary arteries. 2. Negative for pulmonary infarct or pleural effusion. 3. Calcified coronary artery atherosclerosis and abdominal aortic atherosclerosis. 4. Cardiomegaly with left ventricular enlargement. No pericardial effusion. 5. Single left upper lobe 5 mm ground-glass lung nodule. No follow-up recommended. This recommendation follows the consensus statement: Guidelines for Management of Incidental Pulmonary Nodules Detected on CT Images: From the Fleischner Society 2017; Radiology 2017; 284:228-243. Electronically Signed: By: Genevie Ann M.D. On: 03/15/2016 13:59   US Venous Img Lower Bilateral  Result Date: 03/16/2016 CLINICAL DATA:  Pulmonary embolism EXAM: BILATERAL LOWER EXTREMITY VENOUS DUPLEX ULTRASOUND TECHNIQUE: Doppler venous assessment of the bilateral lower extremity deep venous system was performed, including characterization of spectral flow, compressibility, and phasicity. COMPARISON:  None. FINDINGS: There is complete compressibility of the bilateral common femoral, femoral, and popliteal veins. Doppler analysis demonstrates respiratory phasicity and augmentation of flow with calf compression. No obvious superficial vein or calf vein thrombosis. Left popliteal fossa Baker's cyst measures 4.1 x 3.9 x 1.9 cm. IMPRESSION: No evidence of DVT in the lower extremities. Left knee Baker's cyst. Electronically Signed   By: Marybelle Killings M.D.   On: 03/16/2016 09:01     CBC  Recent Labs Lab 03/15/16 1458 03/16/16 0031  WBC 7.8 7.2  HGB 14.1 13.0  HCT 41.2 36.9*  PLT 207 171  MCV 98.8 97.3  MCH 33.8 34.2*  MCHC 34.2 35.1  RDW 14.5 14.5    Chemistries   Recent Labs Lab 03/15/16 1458 03/16/16 0031  NA 136 136  K 3.7 3.7  CL 101 106  CO2 26 25  GLUCOSE 138* 95  BUN 18 19  CREATININE 1.72* 1.69*  CALCIUM 8.8* 8.4*  AST 26  --  ALT 18  --   ALKPHOS  69  --   BILITOT 0.9  --    ------------------------------------------------------------------------------------------------------------------ estimated creatinine clearance is 48.6 mL/min (by C-G formula based on SCr of 1.69 mg/dL (H)). ------------------------------------------------------------------------------------------------------------------ No results for input(s): HGBA1C in the last 72 hours. ------------------------------------------------------------------------------------------------------------------ No results for input(s): CHOL, HDL, LDLCALC, TRIG, CHOLHDL, LDLDIRECT in the last 72 hours. ------------------------------------------------------------------------------------------------------------------ No results for input(s): TSH, T4TOTAL, T3FREE, THYROIDAB in the last 72 hours.  Invalid input(s): FREET3 ------------------------------------------------------------------------------------------------------------------ No results for input(s): VITAMINB12, FOLATE, FERRITIN, TIBC, IRON, RETICCTPCT in the last 72 hours.  Coagulation profile  Recent Labs Lab 03/15/16 1458  INR 1.02    No results for input(s): DDIMER in the last 72 hours.  Cardiac Enzymes  Recent Labs Lab 03/15/16 1458  TROPONINI <0.03   ------------------------------------------------------------------------------------------------------------------ Invalid input(s): POCBNP    Assessment & Plan  Patient is a 76 year old with bilateral pulmonary embolism 1. Bilateral PE Heparin therapy was discontinued and started on Xarelto Dopplers of the lower extremity reviewed and negative for DVT  2. Chronic systolic CHF. Stable. Continue Lasix,  3. CKD stage III. Stable  4. Essential hypertension blood pressure currently borderline discontinue losartan  5. Hyperlipidemia continue simvastatin  Influenza A. Start Tamiflu and isolation. Supportive care. Weakness. PT evaluation.    Code Status  Orders        Start     Ordered   03/15/16 1714  Full code  Continuous     03/15/16 1713    Code Status History    Date Active Date Inactive Code Status Order ID Comments User Context   02/25/2016 12:26 AM 02/26/2016  1:50 PM Full Code [id number]  Lance Coon, MD Inpatient           Consults none   DVT Prophylaxis  Lovenox    Lab Results  Component Value Date   PLT 171 03/16/2016     Time Spent in minutes   35 min  Greater than 50% of time spent in care coordination and counseling patient regarding the condition and plan of care. I discussed with the patient's, his wife, RN and case Freight forwarder. The patient is possibly discharged to home tomorrow.  Demetrios Loll M.D on 03/17/2016 at 5:43 PM  Between 7am to 6pm - Pager - [phone]  After 6pm go to [email or web address] - password [email or web address] [facility] [facility]   Office  [phone]

## 2016-03-17 NOTE — Progress Notes (Signed)
Patient influenza A positive.  Dr. Bridgett Larsson notified.  Orders to be placed.

## 2016-03-18 MED ORDER — GUAIFENESIN-DM 100-10 MG/5ML PO SYRP: 5.0000 mL | ORAL_SOLUTION | ORAL | 0 refills | Status: DC | PRN

## 2016-03-18 MED ORDER — RIVAROXABAN 15 MG PO TABS: 15.0000 mg | ORAL_TABLET | Freq: Two times a day (BID) | ORAL | 0 refills | Status: DC

## 2016-03-18 MED ORDER — RIVAROXABAN 20 MG PO TABS: 20.0000 mg | ORAL_TABLET | Freq: Every day | ORAL | 2 refills | Status: DC

## 2016-03-18 MED ORDER — OSELTAMIVIR PHOSPHATE 30 MG PO CAPS: 30.0000 mg | ORAL_CAPSULE | Freq: Two times a day (BID) | ORAL | 0 refills | Status: DC

## 2016-03-18 NOTE — Discharge Instructions (Signed)
Heart healthy diet.  Information on my medicine - XARELTO (rivaroxaban)  This medication education was reviewed with me or my healthcare representative as part of my discharge preparation.  The pharmacist that spoke with me during my hospital stay was:  Morgan? Xarelto was prescribed to treat blood clots that may have been found in the veins of your legs (deep vein thrombosis) or in your lungs (pulmonary embolism) and to reduce the risk of them occurring again.  What do you need to know about Xarelto? The starting dose is one 15 mg tablet taken TWICE daily with food for the FIRST 21 DAYS then on (enter date)  January 10th 2018  the dose is changed to one 20 mg tablet taken ONCE A DAY with your evening meal.  DO NOT stop taking Xarelto without talking to the health care provider who prescribed the medication.  Refill your prescription for 20 mg tablets before you run out.  After discharge, you should have regular check-up appointments with your healthcare provider that is prescribing your Xarelto.  In the future your dose may need to be changed if your kidney function changes by a significant amount.  What do you do if you miss a dose? If you are taking Xarelto TWICE DAILY and you miss a dose, take it as soon as you remember. You may take two 15 mg tablets (total 30 mg) at the same time then resume your regularly scheduled 15 mg twice daily the next day.  If you are taking Xarelto ONCE DAILY and you miss a dose, take it as soon as you remember on the same day then continue your regularly scheduled once daily regimen the next day. Do not take two doses of Xarelto at the same time.   Important Safety Information Xarelto is a blood thinner medicine that can cause bleeding. You should call your healthcare provider right away if you experience any of the following: ? Bleeding from an injury or your nose that does not stop. ? Unusual colored urine (red or  dark brown) or unusual colored stools (red or black). ? Unusual bruising for unknown reasons. ? A serious fall or if you hit your head (even if there is no bleeding).  Some medicines may interact with Xarelto and might increase your risk of bleeding while on Xarelto. To help avoid this, consult your healthcare provider or pharmacist prior to using any new prescription or non-prescription medications, including herbals, vitamins, non-steroidal anti-inflammatory drugs (NSAIDs) and supplements.  This website has more information on Xarelto: [email or web address]

## 2016-03-18 NOTE — Progress Notes (Signed)
ANTICOAGULATION CONSULT NOTE - Initial Consult  Pharmacy Consult for Rivaroxaban Indication: pulmonary embolus  Allergies  Allergen Reactions  . Carvedilol Other (See Comments)    Hallucinations     Patient Measurements: Height: 6\' 1"  (185.4 cm) Weight: 222 lb (100.7 kg) IBW/kg (Calculated) : 79.9  Vital Signs: Temp: 99.3 F (37.4 C) (12/22 1048) Temp Source: Oral (12/22 1048) BP: 118/77 (12/22 1048) Pulse Rate: 61 (12/22 1048)  Labs:  Recent Labs  03/15/16 1458 03/16/16 0031  HGB 14.1 13.0  HCT 41.2 36.9*  PLT 207 171  APTT 30  --   LABPROT 13.4  --   INR 1.02  --   HEPARINUNFRC  --  1.33*  CREATININE 1.72* 1.69*  TROPONINI <0.03  --     Estimated Creatinine Clearance: 48.6 mL/min (by C-G formula based on SCr of 1.69 mg/dL (H)).   Medical History: Past Medical History:  Diagnosis Date  . CHF (congestive heart failure) ([place])   . Glaucoma   . Hypertension   . Myocardial infarction   . Presence of combination internal cardiac defibrillator (ICD) and pacemaker   . Presence of permanent cardiac pacemaker   . Seizures ([facility])     Assessment: 75 y/o M with a h/o CP and SOB x 3 weeks admitted with B/L PE   Plan:  Patient is to take rivaroxaban 15 mg bid with food x 21 days then begin 20 mg daily with supper. Dose transition scheduled for 04/06/16.   Valen Mascaro C 03/18/2016,11:15 AM

## 2016-03-23 NOTE — Discharge Summary (Signed)
[place] [facility] [place] NAME: Jerry English    MR#:  [id number]  DATE OF BIRTH:  06-19-1940  DATE OF ADMISSION:  03/15/2016   ADMITTING PHYSICIAN: Demetrios Loll, MD  DATE OF DISCHARGE: 03/18/2016 11:28 AM  PRIMARY CARE PHYSICIAN: Leonel Ramsay, MD   ADMISSION DIAGNOSIS:  Other acute pulmonary embolism without acute cor pulmonale ([place]) [I26.99] DISCHARGE DIAGNOSIS:  Active Problems:   Pulmonary embolism ([place])  SECONDARY DIAGNOSIS:   Past Medical History:  Diagnosis Date  . CHF (congestive heart failure) ([facility])   . Glaucoma   . Hypertension   . Myocardial infarction   . Presence of combination internal cardiac defibrillator (ICD) and pacemaker   . Presence of permanent cardiac pacemaker   . Seizures [facility])    HOSPITAL COURSE:   Patient is a 75 year old with bilateral pulmonary embolism 1. Bilateral PE Heparin therapy was discontinued and started on Xarelto Dopplers of the lower extremity reviewed and negative for DVT  2. Chronic systolic CHF. Stable. Continue Lasix,  3. CKDstage III. Stable  4. Essential hypertension blood pressure currently borderline discontinue losartan  5. Hyperlipidemia continue simvastatin  Influenza A. Start Tamiflu and isolation. Supportive care. Weakness. PT evaluation.  DISCHARGE CONDITIONS:  Stable, discharged to home. CONSULTS OBTAINED:  Treatment Team:  Teodoro Spray, MD Isaias Cowman, MD DRUG ALLERGIES:   Allergies  Allergen Reactions  . Carvedilol Other (See Comments)    Hallucinations    DISCHARGE MEDICATIONS:   Allergies as of 03/18/2016      Reactions   Carvedilol Other (See Comments)   Hallucinations       Medication List    STOP taking these medications   losartan 50 MG tablet Commonly known as:  COZAAR   meloxicam 7.5 MG tablet Commonly known as:  MOBIC     TAKE these medications   amiodarone 100 MG tablet Commonly known as:  PACERONE Take 100  mg by mouth every morning.   aspirin EC 81 MG tablet Take 162 mg by mouth every morning.   dorzolamide 2 % ophthalmic solution Commonly known as:  TRUSOPT Place into both eyes 2 (two) times daily.   furosemide 20 MG tablet Commonly known as:  LASIX Take 20 mg by mouth every morning.   guaiFENesin-dextromethorphan 100-10 MG/5ML syrup Commonly known as:  ROBITUSSIN DM Take 5 mLs by mouth every 4 (four) hours as needed for cough.   latanoprost 0.005 % ophthalmic solution Commonly known as:  XALATAN Place 1 drop into both eyes at bedtime.   oseltamivir 30 MG capsule Commonly known as:  TAMIFLU Take 1 capsule (30 mg total) by mouth 2 (two) times daily.   pantoprazole 40 MG tablet Commonly known as:  PROTONIX Take 40 mg by mouth daily.   Rivaroxaban 15 MG Tabs tablet Commonly known as:  XARELTO Take 1 tablet (15 mg total) by mouth 2 (two) times daily with a meal.   rivaroxaban 20 MG Tabs tablet Commonly known as:  XARELTO Take 1 tablet (20 mg total) by mouth daily with supper. After 15 mg po bid for 18 days, take 20 mg po daily Start taking on:  04/06/2016   simvastatin 40 MG tablet Commonly known as:  ZOCOR Take 40 mg by mouth daily.   timolol 0.5 % ophthalmic solution Commonly known as:  TIMOPTIC Place 1 drop into both eyes 2 (two) times daily.        DISCHARGE INSTRUCTIONS:  See AVS.  If you experience  worsening of your admission symptoms, develop shortness of breath, life threatening emergency, suicidal or homicidal thoughts you must seek medical attention immediately by calling 911 or calling your MD immediately  if symptoms less severe.  You Must read complete instructions/literature along with all the possible adverse reactions/side effects for all the Medicines you take and that have been prescribed to you. Take any new Medicines after you have completely understood and accpet all the possible adverse reactions/side effects.   Please note  You were cared  for by a hospitalist during your hospital stay. If you have any questions about your discharge medications or the care you received while you were in the hospital after you are discharged, you can call the unit and asked to speak with the hospitalist on call if the hospitalist that took care of you is not available. Once you are discharged, your primary care physician will handle any further medical issues. Please note that NO REFILLS for any discharge medications will be authorized once you are discharged, as it is imperative that you return to your primary care physician (or establish a relationship with a primary care physician if you do not have one) for your aftercare needs so that they can reassess your need for medications and monitor your lab values.    On the day of Discharge:  VITAL SIGNS:  Blood pressure 118/77, pulse 61, temperature 99.3 F (37.4 C), temperature source Oral, resp. rate 18, height 6\' 1"  (1.854 m), weight 222 lb (100.7 kg), SpO2 93 %. PHYSICAL EXAMINATION:  GENERAL:  75 y.o.-year-old patient lying in the bed with no acute distress.  EYES: Pupils equal, round, reactive to light and accommodation. No scleral icterus. Extraocular muscles intact.  HEENT: Head atraumatic, normocephalic. Oropharynx and nasopharynx clear.  NECK:  Supple, no jugular venous distention. No thyroid enlargement, no tenderness.  LUNGS: Normal breath sounds bilaterally, no wheezing, rales,rhonchi or crepitation. No use of accessory muscles of respiration.  CARDIOVASCULAR: S1, S2 normal. No murmurs, rubs, or gallops.  ABDOMEN: Soft, non-tender, non-distended. Bowel sounds present. No organomegaly or mass.  EXTREMITIES: No pedal edema, cyanosis, or clubbing.  NEUROLOGIC: Cranial nerves II through XII are intact. Muscle strength 5/5 in all extremities. Sensation intact. Gait not checked.  PSYCHIATRIC: The patient is alert and oriented x 3.  SKIN: No obvious rash, lesion, or ulcer.  DATA REVIEW:    CBC No results for input(s): WBC, HGB, HCT, PLT in the last 168 hours.  Chemistries  No results for input(s): NA, K, CL, CO2, GLUCOSE, BUN, CREATININE, CALCIUM, MG, AST, ALT, ALKPHOS, BILITOT in the last 168 hours.  Invalid input(s): [place]   Microbiology Results  Results for orders placed or performed in visit on 07/12/12  Culture, blood (single)     Status: None   Collection Time: 07/12/12  6:24 PM  Result Value Ref Range Status   Micro Text Report   Final       COMMENT                   NO GROWTH AEROBICALLY/ANAEROBICALLY IN 5 DAYS   ANTIBIOTIC                                                      Culture, blood (single)     Status: None   Collection Time: 07/12/12  6:24  PM  Result Value Ref Range Status   Micro Text Report   Final       COMMENT                   NO GROWTH AEROBICALLY/ANAEROBICALLY IN 5 DAYS   ANTIBIOTIC                                                        RADIOLOGY:  No results found.   Management plans discussed with the patient, family and they are in agreement.  CODE STATUS:  Code Status History    Date Active Date Inactive Code Status Order ID Comments User Context   03/15/2016  5:13 PM 03/18/2016  2:34 PM Full Code [id number]  Demetrios Loll, MD Inpatient   02/25/2016 12:26 AM 02/26/2016  1:50 PM Full Code [id number]  Lance Coon, MD Inpatient      TOTAL TIME TAKING CARE OF THIS PATIENT: 32 minutes.    Demetrios Loll M.D on 03/23/2016 at 3:12 PM  Between 7am to 6pm - Pager - [phone]  After 6pm go to [email or web address] - [employer] [facility]  Office  [phone]  CC: Primary care physician; FITZGERALD, DAVID Mamie Nick, MD   Note: This dictation was prepared with Dragon dictation along with smaller phrase technology. Any transcriptional errors that result from this process are unintentional.

## 2016-04-01 DIAGNOSIS — I2510 Atherosclerotic heart disease of native coronary artery without angina pectoris: Secondary | ICD-10-CM | POA: Diagnosis not present

## 2016-04-01 DIAGNOSIS — E785 Hyperlipidemia, unspecified: Secondary | ICD-10-CM | POA: Diagnosis not present

## 2016-04-01 DIAGNOSIS — I10 Essential (primary) hypertension: Secondary | ICD-10-CM | POA: Diagnosis not present

## 2016-04-01 DIAGNOSIS — Z95810 Presence of automatic (implantable) cardiac defibrillator: Secondary | ICD-10-CM | POA: Diagnosis not present

## 2016-04-01 DIAGNOSIS — I5022 Chronic systolic (congestive) heart failure: Secondary | ICD-10-CM | POA: Diagnosis not present

## 2016-04-01 DIAGNOSIS — I480 Paroxysmal atrial fibrillation: Secondary | ICD-10-CM | POA: Diagnosis not present

## 2016-04-19 DIAGNOSIS — H401112 Primary open-angle glaucoma, right eye, moderate stage: Secondary | ICD-10-CM | POA: Diagnosis not present

## 2016-05-03 DIAGNOSIS — I480 Paroxysmal atrial fibrillation: Secondary | ICD-10-CM | POA: Diagnosis not present

## 2016-05-16 DIAGNOSIS — I2510 Atherosclerotic heart disease of native coronary artery without angina pectoris: Secondary | ICD-10-CM | POA: Diagnosis not present

## 2016-05-16 DIAGNOSIS — N183 Chronic kidney disease, stage 3 (moderate): Secondary | ICD-10-CM | POA: Diagnosis not present

## 2016-05-16 DIAGNOSIS — I10 Essential (primary) hypertension: Secondary | ICD-10-CM | POA: Diagnosis not present

## 2016-05-16 DIAGNOSIS — E785 Hyperlipidemia, unspecified: Secondary | ICD-10-CM | POA: Diagnosis not present

## 2016-05-23 DIAGNOSIS — I5022 Chronic systolic (congestive) heart failure: Secondary | ICD-10-CM | POA: Diagnosis not present

## 2016-05-23 DIAGNOSIS — Z0000 Encounter for general adult medical examination without abnormal findings: Secondary | ICD-10-CM | POA: Diagnosis not present

## 2016-05-23 DIAGNOSIS — E785 Hyperlipidemia, unspecified: Secondary | ICD-10-CM | POA: Diagnosis not present

## 2016-05-23 DIAGNOSIS — I2510 Atherosclerotic heart disease of native coronary artery without angina pectoris: Secondary | ICD-10-CM | POA: Diagnosis not present

## 2016-05-23 DIAGNOSIS — N183 Chronic kidney disease, stage 3 (moderate): Secondary | ICD-10-CM | POA: Diagnosis not present

## 2016-05-23 DIAGNOSIS — I480 Paroxysmal atrial fibrillation: Secondary | ICD-10-CM | POA: Diagnosis not present

## 2016-05-23 DIAGNOSIS — I10 Essential (primary) hypertension: Secondary | ICD-10-CM | POA: Diagnosis not present

## 2016-07-15 DIAGNOSIS — E785 Hyperlipidemia, unspecified: Secondary | ICD-10-CM | POA: Diagnosis not present

## 2016-07-15 DIAGNOSIS — Z95810 Presence of automatic (implantable) cardiac defibrillator: Secondary | ICD-10-CM | POA: Diagnosis not present

## 2016-07-15 DIAGNOSIS — I2510 Atherosclerotic heart disease of native coronary artery without angina pectoris: Secondary | ICD-10-CM | POA: Diagnosis not present

## 2016-07-15 DIAGNOSIS — I480 Paroxysmal atrial fibrillation: Secondary | ICD-10-CM | POA: Diagnosis not present

## 2016-07-15 DIAGNOSIS — I5022 Chronic systolic (congestive) heart failure: Secondary | ICD-10-CM | POA: Diagnosis not present

## 2016-07-15 DIAGNOSIS — I4891 Unspecified atrial fibrillation: Secondary | ICD-10-CM | POA: Diagnosis not present

## 2016-07-15 DIAGNOSIS — I10 Essential (primary) hypertension: Secondary | ICD-10-CM | POA: Diagnosis not present

## 2016-08-02 DIAGNOSIS — I480 Paroxysmal atrial fibrillation: Secondary | ICD-10-CM | POA: Diagnosis not present

## 2016-08-16 DIAGNOSIS — H401112 Primary open-angle glaucoma, right eye, moderate stage: Secondary | ICD-10-CM | POA: Diagnosis not present

## 2016-08-24 DIAGNOSIS — H401112 Primary open-angle glaucoma, right eye, moderate stage: Secondary | ICD-10-CM | POA: Diagnosis not present

## 2016-09-19 ENCOUNTER — Other Ambulatory Visit: Payer: Self-pay | Admitting: *Deleted

## 2016-09-19 NOTE — Patient Outreach (Signed)
[facility] Screening call, unsuccessful but left a message for a return call. If I do not hear back from the member I will try again within the week.  Raymonda Pell C. Elta Angell, MSN, GNP-BC Gerontological Nurse Practitioner [facility] Care Management [phone]  

## 2016-09-20 ENCOUNTER — Other Ambulatory Visit: Payer: Self-pay | Admitting: *Deleted

## 2016-09-20 NOTE — Patient Outreach (Signed)
[facility] 2nd Screening call unsuccessful, but left a message for a return call. If I do not hear back from the member I will try again within the week.  Eulah Pont. Myrtie Neither, MSN, [facility] Gerontological Nurse Practitioner [facility] Care Management [phone]

## 2016-09-22 ENCOUNTER — Other Ambulatory Visit: Payer: Self-pay | Admitting: *Deleted

## 2016-09-22 NOTE — Patient Outreach (Signed)
[facility] Screening call: Mr. Errico returned my call yesterday but I was not able to talk and asked if I could call him today. I was not able to reach him at the appointed time but left a message for him to call me in the morning. I also said if I don't herar from you I will call back after noon.  Eulah Pont. Myrtie Neither, MSN, [facility] Gerontological Nurse Practitioner [facility] Care Management [phone]

## 2016-09-23 ENCOUNTER — Other Ambulatory Visit: Payer: Self-pay | Admitting: *Deleted

## 2016-09-23 ENCOUNTER — Encounter: Payer: Self-pay | Admitting: *Deleted

## 2016-09-23 NOTE — Patient Outreach (Signed)
[facility] Screen: Pt sees primary care MD routinely. Also cardiology at [facility]. Mr. Traywick takes medications as ordered for chronic problems (CAD, CHF, AFIB).. No acute health concerns. He is very active, plays golf. He does weigh every day. He admits he still does use a little salt. He gets an annual flu vaccine. No care management needs. Introductory letter sent. I am also including the CHF Action Plan for his review and reminding how important his daily management is.  Eulah Pont. Myrtie Neither, MSN, [facility] Gerontological Nurse Practitioner [facility] Care Management [phone]

## 2016-11-08 DIAGNOSIS — I480 Paroxysmal atrial fibrillation: Secondary | ICD-10-CM | POA: Diagnosis not present

## 2016-11-14 DIAGNOSIS — I5022 Chronic systolic (congestive) heart failure: Secondary | ICD-10-CM | POA: Diagnosis not present

## 2016-11-14 DIAGNOSIS — I480 Paroxysmal atrial fibrillation: Secondary | ICD-10-CM | POA: Diagnosis not present

## 2016-11-14 DIAGNOSIS — N183 Chronic kidney disease, stage 3 (moderate): Secondary | ICD-10-CM | POA: Diagnosis not present

## 2016-11-21 DIAGNOSIS — E785 Hyperlipidemia, unspecified: Secondary | ICD-10-CM | POA: Diagnosis not present

## 2016-11-21 DIAGNOSIS — Z1212 Encounter for screening for malignant neoplasm of rectum: Secondary | ICD-10-CM | POA: Diagnosis not present

## 2016-11-21 DIAGNOSIS — Z1211 Encounter for screening for malignant neoplasm of colon: Secondary | ICD-10-CM | POA: Diagnosis not present

## 2016-11-21 DIAGNOSIS — I10 Essential (primary) hypertension: Secondary | ICD-10-CM | POA: Diagnosis not present

## 2016-11-21 DIAGNOSIS — N183 Chronic kidney disease, stage 3 (moderate): Secondary | ICD-10-CM | POA: Diagnosis not present

## 2016-11-21 DIAGNOSIS — E538 Deficiency of other specified B group vitamins: Secondary | ICD-10-CM | POA: Diagnosis not present

## 2016-11-21 DIAGNOSIS — I2510 Atherosclerotic heart disease of native coronary artery without angina pectoris: Secondary | ICD-10-CM | POA: Diagnosis not present

## 2016-11-29 DIAGNOSIS — I2510 Atherosclerotic heart disease of native coronary artery without angina pectoris: Secondary | ICD-10-CM | POA: Diagnosis not present

## 2016-11-29 DIAGNOSIS — Z1212 Encounter for screening for malignant neoplasm of rectum: Secondary | ICD-10-CM | POA: Diagnosis not present

## 2016-11-29 DIAGNOSIS — Z1211 Encounter for screening for malignant neoplasm of colon: Secondary | ICD-10-CM | POA: Diagnosis not present

## 2016-11-29 DIAGNOSIS — N183 Chronic kidney disease, stage 3 (moderate): Secondary | ICD-10-CM | POA: Diagnosis not present

## 2016-12-23 DIAGNOSIS — H401112 Primary open-angle glaucoma, right eye, moderate stage: Secondary | ICD-10-CM | POA: Diagnosis not present

## 2017-01-18 DIAGNOSIS — Z95810 Presence of automatic (implantable) cardiac defibrillator: Secondary | ICD-10-CM | POA: Diagnosis not present

## 2017-01-18 DIAGNOSIS — I2510 Atherosclerotic heart disease of native coronary artery without angina pectoris: Secondary | ICD-10-CM | POA: Diagnosis not present

## 2017-01-18 DIAGNOSIS — E7801 Familial hypercholesterolemia: Secondary | ICD-10-CM | POA: Diagnosis not present

## 2017-01-18 DIAGNOSIS — I10 Essential (primary) hypertension: Secondary | ICD-10-CM | POA: Diagnosis not present

## 2017-01-18 DIAGNOSIS — I480 Paroxysmal atrial fibrillation: Secondary | ICD-10-CM | POA: Diagnosis not present

## 2017-01-18 DIAGNOSIS — I4891 Unspecified atrial fibrillation: Secondary | ICD-10-CM | POA: Diagnosis not present

## 2017-01-18 DIAGNOSIS — I5022 Chronic systolic (congestive) heart failure: Secondary | ICD-10-CM | POA: Diagnosis not present

## 2017-01-26 DIAGNOSIS — R195 Other fecal abnormalities: Secondary | ICD-10-CM | POA: Diagnosis not present

## 2017-01-26 DIAGNOSIS — Z86010 Personal history of colonic polyps: Secondary | ICD-10-CM | POA: Diagnosis not present

## 2017-01-26 DIAGNOSIS — K210 Gastro-esophageal reflux disease with esophagitis: Secondary | ICD-10-CM | POA: Diagnosis not present

## 2017-02-07 DIAGNOSIS — I480 Paroxysmal atrial fibrillation: Secondary | ICD-10-CM | POA: Diagnosis not present

## 2017-03-10 ENCOUNTER — Encounter: Payer: Self-pay | Admitting: *Deleted

## 2017-03-13 ENCOUNTER — Encounter: Payer: Self-pay | Admitting: *Deleted

## 2017-03-13 ENCOUNTER — Encounter
Admission: RE | Disposition: A | Discharge status: Home / Self Care | Payer: Self-pay | Source: Ambulatory Visit | Attending: Gastroenterology

## 2017-03-13 ENCOUNTER — Ambulatory Visit: Payer: PPO | Admitting: Anesthesiology

## 2017-03-13 ENCOUNTER — Ambulatory Visit: Admit: 2017-03-13 | Payer: PRIVATE HEALTH INSURANCE | Admitting: Internal Medicine

## 2017-03-13 ENCOUNTER — Ambulatory Visit
Admission: RE | Admit: 2017-03-13 | Discharge: 2017-03-13 | Disposition: A | Discharge status: Home / Self Care | Payer: PPO | Source: Ambulatory Visit | Attending: Gastroenterology | Admitting: Gastroenterology

## 2017-03-13 DIAGNOSIS — K5730 Diverticulosis of large intestine without perforation or abscess without bleeding: Secondary | ICD-10-CM | POA: Diagnosis not present

## 2017-03-13 DIAGNOSIS — I252 Old myocardial infarction: Secondary | ICD-10-CM | POA: Insufficient documentation

## 2017-03-13 DIAGNOSIS — D120 Benign neoplasm of cecum: Secondary | ICD-10-CM | POA: Diagnosis not present

## 2017-03-13 DIAGNOSIS — K210 Gastro-esophageal reflux disease with esophagitis: Secondary | ICD-10-CM | POA: Insufficient documentation

## 2017-03-13 DIAGNOSIS — K259 Gastric ulcer, unspecified as acute or chronic, without hemorrhage or perforation: Secondary | ICD-10-CM | POA: Insufficient documentation

## 2017-03-13 DIAGNOSIS — D649 Anemia, unspecified: Secondary | ICD-10-CM | POA: Diagnosis not present

## 2017-03-13 DIAGNOSIS — I2510 Atherosclerotic heart disease of native coronary artery without angina pectoris: Secondary | ICD-10-CM | POA: Diagnosis not present

## 2017-03-13 DIAGNOSIS — K317 Polyp of stomach and duodenum: Secondary | ICD-10-CM | POA: Diagnosis not present

## 2017-03-13 DIAGNOSIS — I739 Peripheral vascular disease, unspecified: Secondary | ICD-10-CM | POA: Diagnosis not present

## 2017-03-13 DIAGNOSIS — I5022 Chronic systolic (congestive) heart failure: Secondary | ICD-10-CM | POA: Diagnosis not present

## 2017-03-13 DIAGNOSIS — K640 First degree hemorrhoids: Secondary | ICD-10-CM | POA: Diagnosis not present

## 2017-03-13 DIAGNOSIS — I110 Hypertensive heart disease with heart failure: Secondary | ICD-10-CM | POA: Insufficient documentation

## 2017-03-13 DIAGNOSIS — D124 Benign neoplasm of descending colon: Secondary | ICD-10-CM | POA: Diagnosis not present

## 2017-03-13 DIAGNOSIS — K2960 Other gastritis without bleeding: Secondary | ICD-10-CM | POA: Diagnosis not present

## 2017-03-13 DIAGNOSIS — K2900 Acute gastritis without bleeding: Secondary | ICD-10-CM | POA: Diagnosis not present

## 2017-03-13 DIAGNOSIS — K2950 Unspecified chronic gastritis without bleeding: Secondary | ICD-10-CM | POA: Diagnosis not present

## 2017-03-13 DIAGNOSIS — D123 Benign neoplasm of transverse colon: Secondary | ICD-10-CM | POA: Diagnosis not present

## 2017-03-13 DIAGNOSIS — I509 Heart failure, unspecified: Secondary | ICD-10-CM | POA: Insufficient documentation

## 2017-03-13 DIAGNOSIS — K621 Rectal polyp: Secondary | ICD-10-CM | POA: Diagnosis not present

## 2017-03-13 DIAGNOSIS — K449 Diaphragmatic hernia without obstruction or gangrene: Secondary | ICD-10-CM | POA: Diagnosis not present

## 2017-03-13 DIAGNOSIS — Z7982 Long term (current) use of aspirin: Secondary | ICD-10-CM | POA: Diagnosis not present

## 2017-03-13 DIAGNOSIS — Z8673 Personal history of transient ischemic attack (TIA), and cerebral infarction without residual deficits: Secondary | ICD-10-CM | POA: Diagnosis not present

## 2017-03-13 DIAGNOSIS — F329 Major depressive disorder, single episode, unspecified: Secondary | ICD-10-CM | POA: Diagnosis not present

## 2017-03-13 DIAGNOSIS — Z95810 Presence of automatic (implantable) cardiac defibrillator: Secondary | ICD-10-CM | POA: Insufficient documentation

## 2017-03-13 DIAGNOSIS — D126 Benign neoplasm of colon, unspecified: Secondary | ICD-10-CM | POA: Diagnosis not present

## 2017-03-13 DIAGNOSIS — D122 Benign neoplasm of ascending colon: Secondary | ICD-10-CM | POA: Diagnosis not present

## 2017-03-13 DIAGNOSIS — Z79899 Other long term (current) drug therapy: Secondary | ICD-10-CM | POA: Diagnosis not present

## 2017-03-13 DIAGNOSIS — D128 Benign neoplasm of rectum: Secondary | ICD-10-CM | POA: Diagnosis not present

## 2017-03-13 DIAGNOSIS — K635 Polyp of colon: Secondary | ICD-10-CM | POA: Diagnosis not present

## 2017-03-13 DIAGNOSIS — K2930 Chronic superficial gastritis without bleeding: Secondary | ICD-10-CM | POA: Diagnosis not present

## 2017-03-13 DIAGNOSIS — K2980 Duodenitis without bleeding: Secondary | ICD-10-CM | POA: Diagnosis not present

## 2017-03-13 HISTORY — DX: Presence of automatic (implantable) cardiac defibrillator: Z95.810

## 2017-03-13 HISTORY — DX: Major depressive disorder, single episode, unspecified: F32.9

## 2017-03-13 HISTORY — DX: Atherosclerotic heart disease of native coronary artery without angina pectoris: I25.10

## 2017-03-13 HISTORY — DX: Peripheral vascular disease, unspecified: I73.9

## 2017-03-13 HISTORY — DX: Depression, unspecified: F32.A

## 2017-03-13 HISTORY — PX: COLONOSCOPY WITH PROPOFOL: SHX5780

## 2017-03-13 HISTORY — PX: ESOPHAGOGASTRODUODENOSCOPY (EGD) WITH PROPOFOL: SHX5813

## 2017-03-13 HISTORY — DX: Gastro-esophageal reflux disease without esophagitis: K21.9

## 2017-03-13 HISTORY — DX: Cerebral infarction, unspecified: I63.9

## 2017-03-13 SURGERY — COLONOSCOPY WITH PROPOFOL
Anesthesia: General

## 2017-03-13 SURGERY — ESOPHAGOGASTRODUODENOSCOPY (EGD) WITH PROPOFOL
Anesthesia: General

## 2017-03-13 MED ORDER — SODIUM CHLORIDE 0.9 % IV SOLN
2.0000 g | Freq: Once | INTRAVENOUS | Status: AC
  Administered 2017-03-13: 1 g via INTRAVENOUS

## 2017-03-13 MED ORDER — PROPOFOL 10 MG/ML IV BOLUS
INTRAVENOUS | Status: AC
  Filled 2017-03-13: qty 20

## 2017-03-13 MED ORDER — CEFAZOLIN SODIUM-DEXTROSE 2-4 GM/100ML-% IV SOLN
INTRAVENOUS | Status: AC
  Filled 2017-03-13: qty 100

## 2017-03-13 MED ORDER — PHENYLEPHRINE HCL 10 MG/ML IJ SOLN
INTRAMUSCULAR | Status: DC | PRN
  Administered 2017-03-13 (×6): 100 ug via INTRAVENOUS

## 2017-03-13 MED ORDER — SODIUM CHLORIDE 0.9 % IV SOLN: INTRAVENOUS | Status: DC

## 2017-03-13 MED ORDER — LIDOCAINE HCL (CARDIAC) 20 MG/ML IV SOLN
INTRAVENOUS | Status: DC | PRN
  Administered 2017-03-13: 100 mg via INTRAVENOUS

## 2017-03-13 MED ORDER — SODIUM CHLORIDE 0.9 % IV SOLN
INTRAVENOUS | Status: AC
  Filled 2017-03-13: qty 2000

## 2017-03-13 MED ORDER — PROPOFOL 10 MG/ML IV BOLUS
INTRAVENOUS | Status: DC | PRN
  Administered 2017-03-13: 50 mg via INTRAVENOUS
  Administered 2017-03-13: 20 mg via INTRAVENOUS

## 2017-03-13 MED ORDER — PROPOFOL 500 MG/50ML IV EMUL
INTRAVENOUS | Status: DC | PRN
  Administered 2017-03-13: 125 ug/kg/min via INTRAVENOUS

## 2017-03-13 MED ORDER — PROPOFOL 500 MG/50ML IV EMUL
INTRAVENOUS | Status: AC
  Filled 2017-03-13: qty 50

## 2017-03-13 MED ORDER — SODIUM CHLORIDE 0.9 % IV SOLN
INTRAVENOUS | Status: DC
  Administered 2017-03-13 (×2): via INTRAVENOUS

## 2017-03-13 NOTE — Anesthesia Postprocedure Evaluation (Signed)
Anesthesia Post Note  Patient: Jackob Crookston Arpino  Procedure(s) Performed: COLONOSCOPY WITH PROPOFOL (N/A ) ESOPHAGOGASTRODUODENOSCOPY (EGD) WITH PROPOFOL (N/A )  Patient location during evaluation: PACU Anesthesia Type: General Level of consciousness: awake and alert Pain management: pain level controlled Vital Signs Assessment: post-procedure vital signs reviewed and stable Respiratory status: spontaneous breathing, nonlabored ventilation, respiratory function stable and patient connected to nasal cannula oxygen Cardiovascular status: blood pressure returned to baseline and stable Postop Assessment: no apparent nausea or vomiting Anesthetic complications: no     Last Vitals:  Vitals:   03/13/17 1530 03/13/17 1540  BP: 121/82 125/76  Pulse: (!) 58 (!) 59  Resp: 16 14  Temp:    SpO2: 98% 98%    Last Pain:  Vitals:   03/13/17 1520  TempSrc:   PainSc: Waller Valma Rotenberg

## 2017-03-13 NOTE — Anesthesia Preprocedure Evaluation (Signed)
Anesthesia Evaluation  Patient identified by MRN, date of birth, ID band Patient awake    Reviewed: Allergy & Precautions, H&P , NPO status , Patient's Chart, lab work & pertinent test results, reviewed documented beta blocker date and time   Airway Mallampati: II   Neck ROM: full    Dental  (+) Poor Dentition   Pulmonary neg pulmonary ROS,    Pulmonary exam normal        Cardiovascular Exercise Tolerance: Poor hypertension, On Medications + angina with exertion + CAD, + Past MI, + Peripheral Vascular Disease and +CHF  negative cardio ROS Normal cardiovascular exam+ pacemaker + Cardiac Defibrillator  Rhythm:regular Rate:Normal     Neuro/Psych Seizures -,  PSYCHIATRIC DISORDERS CVA negative neurological ROS  negative psych ROS   GI/Hepatic negative GI ROS, Neg liver ROS, GERD  Medicated,  Endo/Other  negative endocrine ROS  Renal/GU negative Renal ROS  negative genitourinary   Musculoskeletal   Abdominal   Peds  Hematology negative hematology ROS (+)   Anesthesia Other Findings Past Medical History: No date: AICD (automatic cardioverter/defibrillator) present No date: CHF (congestive heart failure) (HCC) No date: Coronary artery disease No date: Depression No date: GERD (gastroesophageal reflux disease) No date: Glaucoma No date: Hypertension No date: Myocardial infarction ([place]) No date: Peripheral vascular disease (HCC) No date: Presence of combination internal cardiac defibrillator (ICD)  and pacemaker No date: Presence of permanent cardiac pacemaker No date: Seizures ([place]) No date: Stroke [facility]) Past Surgical History: No date: BRAIN SURGERY No date: CARDIAC CATHETERIZATION 02/25/2016: CARDIAC CATHETERIZATION; N/A     Comment:  Procedure: Left Heart Cath and Coronary Angiography;                Surgeon: Teodoro Spray, MD;  Location: [place] [facility]               LAB;  Service: Cardiovascular;   Laterality: N/A; 10/02/2014: COLONOSCOPY WITH PROPOFOL; N/A     Comment:  Procedure: COLONOSCOPY WITH PROPOFOL;  Surgeon: Hulen Luster, MD;  Location: [facility] ENDOSCOPY;  Service:               Gastroenterology;  Laterality: N/A; No date: CORONARY ARTERY BYPASS GRAFT 10/02/2014: ESOPHAGOGASTRODUODENOSCOPY (EGD) WITH PROPOFOL; N/A     Comment:  Procedure: ESOPHAGOGASTRODUODENOSCOPY (EGD) WITH               PROPOFOL;  Surgeon: Hulen Luster, MD;  Location: [facility]               ENDOSCOPY;  Service: Gastroenterology;  Laterality: N/A; No date: INSERT / REPLACE / REMOVE PACEMAKER No date: JOINT REPLACEMENT     Comment:  left knee 03/05/2015: MICROLARYNGOSCOPY W/VOCAL CORD INJECTION; N/A     Comment:  Procedure: MICROLARYNGOSCOPY WITH VOCAL CORD INJECTION;               Surgeon: Carloyn Manner, MD;  Location: [facility] ORS;                Service: ENT;  Laterality: N/A; BMI    Body Mass Index:  29.16 kg/m     Reproductive/Obstetrics negative OB ROS                             Anesthesia Physical Anesthesia Plan  ASA: IV  Anesthesia Plan: General   Post-op Pain Management:    Induction:  PONV Risk Score and Plan:   Airway Management Planned:   Additional Equipment:   Intra-op Plan:   Post-operative Plan:   Informed Consent: I have reviewed the patients History and Physical, chart, labs and discussed the procedure including the risks, benefits and alternatives for the proposed anesthesia with the patient or authorized representative who has indicated his/her understanding and acceptance.   Dental Advisory Given  Plan Discussed with: CRNA  Anesthesia Plan Comments:         Anesthesia Quick Evaluation

## 2017-03-13 NOTE — Transfer of Care (Signed)
Immediate Anesthesia Transfer of Care Note  Patient: Jerry English  Procedure(s) Performed: COLONOSCOPY WITH PROPOFOL (N/A ) ESOPHAGOGASTRODUODENOSCOPY (EGD) WITH PROPOFOL (N/A )  Patient Location: PACU  Anesthesia Type:General  Level of Consciousness: sedated  Airway & Oxygen Therapy: Patient Spontanous Breathing and Patient connected to nasal cannula oxygen  Post-op Assessment: Report given to RN and Post -op Vital signs reviewed and stable  Post vital signs: Reviewed and stable  Last Vitals:  Vitals:   03/13/17 1243 03/13/17 1510  BP: 122/84   Pulse: 80   Resp: 16   Temp: (!) 36.3 C (!) (P) 36.2 C  SpO2: 95%     Last Pain:  Vitals:   03/13/17 1510  TempSrc:   PainSc: (P) Asleep         Complications: No apparent anesthesia complications

## 2017-03-13 NOTE — H&P (Signed)
Outpatient short stay form Pre-procedure 03/13/2017 1:30 PM Jerry Sails MD  Primary Physician: Dr. Adrian Prows  Reason for visit:  EGD and colonoscopy  History of present illness:  Patient is a 76 year old male presenting today as above. He has a history of a Hemoccult-positive stool on routine screening. He was also found to have a very mild anemia which was normocytic. He does take stool antiplatelet treatment with Xarelto and 81 mg aspirin. He is held his relative for a little over 48 hours. He is held his aspirin this morning. He takes no other aspirin products or blood thinning agents. He does have a history of dysphagia who was successfully dilated several years ago and currently does not have the symptom. He is continuing on a proton pump inhibitor daily.  He does have a history of a joint replacement in 2009.    Current Facility-Administered Medications:  .  0.9 %  sodium chloride infusion, , Intravenous, Continuous, Jerry Sails, MD, Last Rate: 20 mL/hr at 03/13/17 1308 .  0.9 %  sodium chloride infusion, , Intravenous, Continuous, Jerry Sails, MD .  ampicillin (OMNIPEN) 2 g in sodium chloride 0.9 % 50 mL IVPB, 2 g, Intravenous, Once, Jerry Sails, MD, Last Rate: 150 mL/hr at 03/13/17 1324, 1 g at 03/13/17 1324  Medications Prior to Admission  Medication Sig Dispense Refill Last Dose  . amiodarone (PACERONE) 100 MG tablet Take 100 mg by mouth every morning.    03/13/2017 at Unknown time  . aspirin EC 81 MG tablet Take 162 mg by mouth every morning.   03/12/2017 at Unknown time  . dorzolamide (TRUSOPT) 2 % ophthalmic solution Place into both eyes 2 (two) times daily.   03/13/2017 at Unknown time  . furosemide (LASIX) 20 MG tablet Take 20 mg by mouth every morning.   03/12/2017 at Unknown time  . latanoprost (XALATAN) 0.005 % ophthalmic solution Place 1 drop into both eyes at bedtime.   03/13/2017 at Unknown time  . losartan (COZAAR) 50 MG tablet Take 50  mg by mouth daily.   03/13/2017 at Unknown time  . pantoprazole (PROTONIX) 40 MG tablet Take 40 mg by mouth daily.   03/13/2017 at Unknown time  . simvastatin (ZOCOR) 40 MG tablet Take 40 mg by mouth daily.    03/13/2017 at Unknown time  . timolol (TIMOPTIC) 0.5 % ophthalmic solution Place 1 drop into both eyes 2 (two) times daily.   03/12/2017 at Unknown time  . Acetaminophen (TYLENOL PO) Take 500-1,000 mg by mouth every 4 (four) hours as needed (for Fever).   Not Taking at Unknown time  . guaiFENesin-dextromethorphan (ROBITUSSIN DM) 100-10 MG/5ML syrup Take 5 mLs by mouth every 4 (four) hours as needed for cough. (Patient not taking: Reported on 09/23/2016) 236 mL 0 Not Taking at Unknown time  . oseltamivir (TAMIFLU) 30 MG capsule Take 1 capsule (30 mg total) by mouth 2 (two) times daily. (Patient not taking: Reported on 09/23/2016) 8 capsule 0 Not Taking at Unknown time  . Rivaroxaban (XARELTO) 15 MG TABS tablet Take 1 tablet (15 mg total) by mouth 2 (two) times daily with a meal. (Patient not taking: Reported on 09/23/2016) 36 tablet 0 Not Taking at Unknown time  . rivaroxaban (XARELTO) 20 MG TABS tablet Take 1 tablet (20 mg total) by mouth daily with supper. After 15 mg po bid for 18 days, take 20 mg po daily 30 tablet 2 03/10/2017     Allergies  Allergen Reactions  .  Carvedilol Other (See Comments)    Hallucinations      Past Medical History:  Diagnosis Date  . AICD (automatic cardioverter/defibrillator) present   . CHF (congestive heart failure) ([place])   . Coronary artery disease   . Depression   . GERD (gastroesophageal reflux disease)   . Glaucoma   . Hypertension   . Myocardial infarction ([place])   . Peripheral vascular disease ([place])   . Presence of combination internal cardiac defibrillator (ICD) and pacemaker   . Presence of permanent cardiac pacemaker   . Seizures ([place])   . Stroke [facility])     Review of systems:      Physical Exam    Heart and lungs: Regular rate and  rhythm without rub or gallop, lungs are bilaterally clear.    HEENT: Normocephalic atraumatic eyes are anicteric    Other:     Pertinant exam for procedure: Soft nontender nondistended bowel sounds positive normoactive.    Planned proceedures: EGD, colonoscopy and indicated procedures. I have discussed the risks benefits and complications of procedures to include not limited to bleeding, infection, perforation and the risk of sedation and the patient wishes to proceed.    Jerry Sails, MD Gastroenterology 03/13/2017  1:30 PM

## 2017-03-13 NOTE — Anesthesia Post-op Follow-up Note (Signed)
Anesthesia QCDR form completed.        

## 2017-03-13 NOTE — Op Note (Signed)
[facility] Gastroenterology Patient Name: Jerry English Procedure Date: 03/13/2017 1:37 PM MRN: [id number] Account #: [id number] Date of Birth: 1941/03/22 Admit Type: Outpatient Age: 76 Room: [facility] ENDO ROOM 1 Gender: Male Note Status: Finalized Procedure:            Upper GI endoscopy Indications:          Heme positive stool Providers:            [name], MD Referring MD:         Adrian Prows (Referring MD) Medicines:            Monitored Anesthesia Care Complications:        No immediate complications. Procedure:            Pre-Anesthesia Assessment:                       - ASA Grade Assessment: III - A patient with severe                        systemic disease.                       After obtaining informed consent, the endoscope was                        passed under direct vision. Throughout the procedure,                        the patient's blood pressure, pulse, and oxygen                        saturations were monitored continuously. The Endoscope                        was introduced through the mouth, and advanced to the                        third part of duodenum. The upper GI endoscopy was                        accomplished without difficulty. The patient tolerated                        the procedure well. Findings:      The Z-line was variable. Biopsies were taken with a cold forceps for       histology.      The exam of the esophagus was otherwise normal.      Diffuse mild inflammation characterized by erythema was found in the       gastric body. Biopsies were taken with a cold forceps for histology.      The cardia and gastric fundus were normal on retroflexion.      A single non-bleeding localized erosion was found at the incisura. There       were no stigmata of recent bleeding. Biopsies were taken with a cold       forceps for histology.      One 2 mm sessile polyp with no stigmata of recent bleeding was found on    the greater curvature of the gastric body. The polyp was removed with a       cold biopsy  forceps. Resection and retrieval were complete.      Patchy minimal inflammation characterized by erythema was found in the       duodenal bulb.      A small hiatal hernia was present. Impression:           - Z-line variable. Biopsied.                       - Bile gastritis. Biopsied.                       - Gastric erosion without bleeding. Biopsied.                       - One gastric polyp. Resected and retrieved.                       - Duodenitis. Recommendation:       - Await pathology results.                       - Continue present medications.                       - Return to GI clinic in 6 weeks. Procedure Code(s):    --- Professional ---                       [id number], Esophagogastroduodenoscopy, flexible, transoral;                        with biopsy, single or multiple Diagnosis Code(s):    --- Professional ---                       K22.8, Other specified diseases of esophagus                       K29.60, Other gastritis without bleeding                       K25.9, Gastric ulcer, unspecified as acute or chronic,                        without hemorrhage or perforation                       K31.7, Polyp of stomach and duodenum                       K29.80, Duodenitis without bleeding                       R19.5, Other fecal abnormalities CPT copyright 2016 American Medical Association. All rights reserved. The codes documented in this report are preliminary and upon coder review may  be revised to meet current compliance requirements. [name], MD 03/13/2017 2:07:54 PM This report has been signed electronically. Number of Addenda: 0 Note Initiated On: 03/13/2017 1:37 PM      [facility]

## 2017-03-13 NOTE — Op Note (Signed)
[facility] Gastroenterology Patient Name: Jerry English Procedure Date: 03/13/2017 1:36 PM MRN: [id number] Account #: [id number] Date of Birth: 02/24/1941 Admit Type: Outpatient Age: 76 Room: [facility] ENDO ROOM 1 Gender: Male Note Status: Finalized Procedure:            Colonoscopy Indications:          Heme positive stool Providers:            [name], MD Referring MD:         Adrian Prows (Referring MD) Medicines:            Monitored Anesthesia Care Complications:        No immediate complications. Procedure:            Pre-Anesthesia Assessment:                       - ASA Grade Assessment: III - A patient with severe                        systemic disease.                       After obtaining informed consent, the colonoscope was                        passed under direct vision. Throughout the procedure,                        the patient's blood pressure, pulse, and oxygen                        saturations were monitored continuously. The                        Colonoscope was introduced through the anus and                        advanced to the the terminal ileum. The colonoscopy was                        unusually difficult due to poor bowel prep and a                        tortuous colon. Successful completion of the procedure                        was aided by lavage. The patient tolerated the                        procedure well. The quality of the bowel preparation                        was fair. Findings:      A 3 mm polyp was found in the rectum. The polyp was sessile. The polyp       was removed with a cold snare. Resection and retrieval were complete.      A 4 mm polyp was found in the transverse colon. The polyp was sessile.       The polyp was removed with a cold snare. Resection and retrieval were  complete.      A 2 mm polyp was found in the ascending colon. The polyp was sessile.       The polyp was removed with a  cold biopsy forceps. Resection and       retrieval were complete.      A 2 mm polyp was found in the appendiceal orifice. The polyp was       sessile. The polyp was removed with a cold biopsy forceps. Resection and       retrieval were complete.      Two sessile polyps were found in the cecum. The polyps were 2 to 3 mm in       size. These polyps were removed with a cold biopsy forceps. Resection       and retrieval were complete.      A 5 mm polyp was found in the hepatic flexure. The polyp was sessile.       The polyp was removed with a cold snare. Resection and retrieval were       complete.      A 2 mm polyp was found in the hepatic flexure. The polyp was sessile.       The polyp was removed with a cold biopsy forceps. Resection and       retrieval were complete.      A 2 mm polyp was found in the transverse colon. The polyp was sessile.       The polyp was removed with a cold biopsy forceps. Resection and       retrieval were complete.      A 3 mm polyp was found in the splenic flexure. The polyp was sessile.       The polyp was removed with a cold biopsy forceps. Resection and       retrieval were complete.      Two sessile polyps were found in the proximal descending colon. The       polyps were 4 to 5 mm in size. These polyps were removed with a cold       snare. Resection and retrieval were complete.      Two sessile polyps were found in the rectum. The polyps were 1 to 2 mm       in size. These polyps were removed with a cold biopsy forceps. Resection       and retrieval were complete.      Multiple medium-mouthed diverticula were found in the sigmoid colon and       descending colon.      Non-bleeding internal hemorrhoids were found during retroflexion. The       hemorrhoids were small and Grade I (internal hemorrhoids that do not       prolapse). Impression:           - Preparation of the colon was fair.                       - One 3 mm polyp in the rectum, removed with a  cold                        snare. Resected and retrieved.                       - One 4 mm polyp in the transverse colon, removed with  a cold snare. Resected and retrieved.                       - One 2 mm polyp in the ascending colon, removed with a                        cold biopsy forceps. Resected and retrieved.                       - One 2 mm polyp at the appendiceal orifice, removed                        with a cold biopsy forceps. Resected and retrieved.                       - Two 2 to 3 mm polyps in the cecum, removed with a                        cold biopsy forceps. Resected and retrieved.                       - One 5 mm polyp at the hepatic flexure, removed with a                        cold snare. Resected and retrieved.                       - One 2 mm polyp at the hepatic flexure, removed with a                        cold biopsy forceps. Resected and retrieved.                       - One 2 mm polyp in the transverse colon, removed with                        a cold biopsy forceps. Resected and retrieved.                       - One 3 mm polyp at the splenic flexure, removed with a                        cold biopsy forceps. Resected and retrieved.                       - Two 4 to 5 mm polyps in the proximal descending                        colon, removed with a cold snare. Resected and                        retrieved.                       - Two 1 to 2 mm polyps in the rectum, removed with a                        cold biopsy forceps. Resected and retrieved.                       -  Diverticulosis in the sigmoid colon and in the                        descending colon. Recommendation:       - Discharge patient to home.                       - Telephone GI clinic for pathology results in 1 week.                       - Full liquid diet for 1 day, then advance as tolerated                        to soft diet for 2 days. Procedure Code(s):     --- Professional ---                       [phone], Colonoscopy, flexible; with removal of tumor(s),                        polyp(s), or other lesion(s) by snare technique                       [id number], 21, Colonoscopy, flexible; with biopsy, single                        or multiple Diagnosis Code(s):    --- Professional ---                       K62.1, Rectal polyp                       D12.2, Benign neoplasm of ascending colon                       D12.1, Benign neoplasm of appendix                       D12.3, Benign neoplasm of transverse colon (hepatic                        flexure or splenic flexure)                       D12.0, Benign neoplasm of cecum                       D12.4, Benign neoplasm of descending colon                       R19.5, Other fecal abnormalities                       K57.30, Diverticulosis of large intestine without                        perforation or abscess without bleeding CPT copyright 2016 American Medical Association. All rights reserved. The codes documented in this report are preliminary and upon coder review may  be revised to meet current compliance requirements. [name], MD 03/13/2017 3:16:53 PM This report has been signed electronically. Number of Addenda: 0 Note Initiated On: 03/13/2017 1:36 PM Scope Withdrawal Time: 0 hours  29 minutes 26 seconds  Total Procedure Duration: 0 hours 51 minutes 40 seconds       [facility]

## 2017-03-14 ENCOUNTER — Encounter: Payer: Self-pay | Admitting: Gastroenterology

## 2017-03-15 LAB — SURGICAL PATHOLOGY

## 2017-03-23 DIAGNOSIS — Z23 Encounter for immunization: Secondary | ICD-10-CM | POA: Diagnosis not present

## 2017-05-16 DIAGNOSIS — E538 Deficiency of other specified B group vitamins: Secondary | ICD-10-CM | POA: Diagnosis not present

## 2017-05-16 DIAGNOSIS — E785 Hyperlipidemia, unspecified: Secondary | ICD-10-CM | POA: Diagnosis not present

## 2017-05-16 DIAGNOSIS — N183 Chronic kidney disease, stage 3 (moderate): Secondary | ICD-10-CM | POA: Diagnosis not present

## 2017-05-16 DIAGNOSIS — I2510 Atherosclerotic heart disease of native coronary artery without angina pectoris: Secondary | ICD-10-CM | POA: Diagnosis not present

## 2017-05-23 DIAGNOSIS — Z0000 Encounter for general adult medical examination without abnormal findings: Secondary | ICD-10-CM | POA: Diagnosis not present

## 2017-05-23 DIAGNOSIS — I480 Paroxysmal atrial fibrillation: Secondary | ICD-10-CM | POA: Diagnosis not present

## 2017-05-23 DIAGNOSIS — Z7901 Long term (current) use of anticoagulants: Secondary | ICD-10-CM | POA: Diagnosis not present

## 2017-05-23 DIAGNOSIS — N183 Chronic kidney disease, stage 3 (moderate): Secondary | ICD-10-CM | POA: Diagnosis not present

## 2017-05-23 DIAGNOSIS — I2510 Atherosclerotic heart disease of native coronary artery without angina pectoris: Secondary | ICD-10-CM | POA: Diagnosis not present

## 2017-05-23 DIAGNOSIS — E538 Deficiency of other specified B group vitamins: Secondary | ICD-10-CM | POA: Diagnosis not present

## 2017-05-23 DIAGNOSIS — I5022 Chronic systolic (congestive) heart failure: Secondary | ICD-10-CM | POA: Diagnosis not present

## 2017-05-23 DIAGNOSIS — I10 Essential (primary) hypertension: Secondary | ICD-10-CM | POA: Diagnosis not present

## 2017-05-23 DIAGNOSIS — I2699 Other pulmonary embolism without acute cor pulmonale: Secondary | ICD-10-CM | POA: Diagnosis not present

## 2017-06-06 DIAGNOSIS — I480 Paroxysmal atrial fibrillation: Secondary | ICD-10-CM | POA: Diagnosis not present

## 2017-06-21 DIAGNOSIS — H401112 Primary open-angle glaucoma, right eye, moderate stage: Secondary | ICD-10-CM | POA: Diagnosis not present

## 2017-06-29 DIAGNOSIS — H401112 Primary open-angle glaucoma, right eye, moderate stage: Secondary | ICD-10-CM | POA: Diagnosis not present

## 2017-07-12 DIAGNOSIS — R040 Epistaxis: Secondary | ICD-10-CM | POA: Diagnosis not present

## 2017-07-17 DIAGNOSIS — R040 Epistaxis: Secondary | ICD-10-CM | POA: Diagnosis not present

## 2017-07-17 DIAGNOSIS — T45515A Adverse effect of anticoagulants, initial encounter: Secondary | ICD-10-CM | POA: Diagnosis not present

## 2017-07-20 DIAGNOSIS — Z79899 Other long term (current) drug therapy: Secondary | ICD-10-CM | POA: Diagnosis not present

## 2017-07-20 DIAGNOSIS — E782 Mixed hyperlipidemia: Secondary | ICD-10-CM | POA: Diagnosis not present

## 2017-07-20 DIAGNOSIS — I480 Paroxysmal atrial fibrillation: Secondary | ICD-10-CM | POA: Diagnosis not present

## 2017-07-20 DIAGNOSIS — I10 Essential (primary) hypertension: Secondary | ICD-10-CM | POA: Diagnosis not present

## 2017-07-20 DIAGNOSIS — I2510 Atherosclerotic heart disease of native coronary artery without angina pectoris: Secondary | ICD-10-CM | POA: Diagnosis not present

## 2017-07-20 DIAGNOSIS — I5022 Chronic systolic (congestive) heart failure: Secondary | ICD-10-CM | POA: Diagnosis not present

## 2017-07-20 DIAGNOSIS — Z95810 Presence of automatic (implantable) cardiac defibrillator: Secondary | ICD-10-CM | POA: Diagnosis not present

## 2017-07-20 DIAGNOSIS — R918 Other nonspecific abnormal finding of lung field: Secondary | ICD-10-CM | POA: Diagnosis not present

## 2017-08-07 DIAGNOSIS — Z79899 Other long term (current) drug therapy: Secondary | ICD-10-CM | POA: Diagnosis not present

## 2017-08-18 DIAGNOSIS — T82198A Other mechanical complication of other cardiac electronic device, initial encounter: Secondary | ICD-10-CM | POA: Diagnosis not present

## 2017-08-18 DIAGNOSIS — Z95810 Presence of automatic (implantable) cardiac defibrillator: Secondary | ICD-10-CM | POA: Diagnosis not present

## 2017-08-18 DIAGNOSIS — I5022 Chronic systolic (congestive) heart failure: Secondary | ICD-10-CM | POA: Diagnosis not present

## 2017-08-24 DIAGNOSIS — E538 Deficiency of other specified B group vitamins: Secondary | ICD-10-CM | POA: Diagnosis not present

## 2017-10-03 DIAGNOSIS — I481 Persistent atrial fibrillation: Secondary | ICD-10-CM | POA: Diagnosis not present

## 2017-10-21 DIAGNOSIS — Z95810 Presence of automatic (implantable) cardiac defibrillator: Secondary | ICD-10-CM | POA: Diagnosis not present

## 2017-10-21 DIAGNOSIS — R29818 Other symptoms and signs involving the nervous system: Secondary | ICD-10-CM | POA: Diagnosis not present

## 2017-10-21 DIAGNOSIS — G9389 Other specified disorders of brain: Secondary | ICD-10-CM | POA: Diagnosis not present

## 2017-10-21 DIAGNOSIS — I2510 Atherosclerotic heart disease of native coronary artery without angina pectoris: Secondary | ICD-10-CM | POA: Diagnosis not present

## 2017-10-21 DIAGNOSIS — Z79899 Other long term (current) drug therapy: Secondary | ICD-10-CM | POA: Diagnosis not present

## 2017-10-21 DIAGNOSIS — E669 Obesity, unspecified: Secondary | ICD-10-CM | POA: Diagnosis not present

## 2017-10-21 DIAGNOSIS — I371 Nonrheumatic pulmonary valve insufficiency: Secondary | ICD-10-CM | POA: Diagnosis not present

## 2017-10-21 DIAGNOSIS — Z955 Presence of coronary angioplasty implant and graft: Secondary | ICD-10-CM | POA: Diagnosis not present

## 2017-10-21 DIAGNOSIS — E785 Hyperlipidemia, unspecified: Secondary | ICD-10-CM | POA: Diagnosis not present

## 2017-10-21 DIAGNOSIS — Z7982 Long term (current) use of aspirin: Secondary | ICD-10-CM | POA: Diagnosis not present

## 2017-10-21 DIAGNOSIS — I361 Nonrheumatic tricuspid (valve) insufficiency: Secondary | ICD-10-CM | POA: Diagnosis not present

## 2017-10-21 DIAGNOSIS — Z86711 Personal history of pulmonary embolism: Secondary | ICD-10-CM | POA: Diagnosis not present

## 2017-10-21 DIAGNOSIS — H9190 Unspecified hearing loss, unspecified ear: Secondary | ICD-10-CM | POA: Diagnosis not present

## 2017-10-21 DIAGNOSIS — Z09 Encounter for follow-up examination after completed treatment for conditions other than malignant neoplasm: Secondary | ICD-10-CM | POA: Diagnosis not present

## 2017-10-21 DIAGNOSIS — R0789 Other chest pain: Secondary | ICD-10-CM | POA: Diagnosis not present

## 2017-10-21 DIAGNOSIS — N183 Chronic kidney disease, stage 3 (moderate): Secondary | ICD-10-CM | POA: Diagnosis not present

## 2017-10-21 DIAGNOSIS — Z7902 Long term (current) use of antithrombotics/antiplatelets: Secondary | ICD-10-CM | POA: Diagnosis not present

## 2017-10-21 DIAGNOSIS — K760 Fatty (change of) liver, not elsewhere classified: Secondary | ICD-10-CM | POA: Diagnosis not present

## 2017-10-21 DIAGNOSIS — I340 Nonrheumatic mitral (valve) insufficiency: Secondary | ICD-10-CM | POA: Diagnosis not present

## 2017-10-21 DIAGNOSIS — R200 Anesthesia of skin: Secondary | ICD-10-CM | POA: Diagnosis not present

## 2017-10-21 DIAGNOSIS — H409 Unspecified glaucoma: Secondary | ICD-10-CM | POA: Diagnosis not present

## 2017-10-21 DIAGNOSIS — I4891 Unspecified atrial fibrillation: Secondary | ICD-10-CM | POA: Diagnosis not present

## 2017-10-21 DIAGNOSIS — I5040 Unspecified combined systolic (congestive) and diastolic (congestive) heart failure: Secondary | ICD-10-CM | POA: Diagnosis not present

## 2017-10-21 DIAGNOSIS — I129 Hypertensive chronic kidney disease with stage 1 through stage 4 chronic kidney disease, or unspecified chronic kidney disease: Secondary | ICD-10-CM | POA: Diagnosis not present

## 2017-10-21 DIAGNOSIS — I429 Cardiomyopathy, unspecified: Secondary | ICD-10-CM | POA: Diagnosis not present

## 2017-10-21 DIAGNOSIS — I255 Ischemic cardiomyopathy: Secondary | ICD-10-CM | POA: Diagnosis not present

## 2017-10-21 DIAGNOSIS — R079 Chest pain, unspecified: Secondary | ICD-10-CM | POA: Diagnosis not present

## 2017-11-02 DIAGNOSIS — E782 Mixed hyperlipidemia: Secondary | ICD-10-CM | POA: Diagnosis not present

## 2017-11-02 DIAGNOSIS — I5022 Chronic systolic (congestive) heart failure: Secondary | ICD-10-CM | POA: Diagnosis not present

## 2017-11-02 DIAGNOSIS — I480 Paroxysmal atrial fibrillation: Secondary | ICD-10-CM | POA: Diagnosis not present

## 2017-11-02 DIAGNOSIS — I2510 Atherosclerotic heart disease of native coronary artery without angina pectoris: Secondary | ICD-10-CM | POA: Diagnosis not present

## 2017-11-03 ENCOUNTER — Other Ambulatory Visit: Payer: Self-pay | Admitting: *Deleted

## 2017-11-03 NOTE — Patient Outreach (Signed)
[facility]) Care Management  11/03/2017  Raford Brissett Headley 1940/09/08 [id number]  Referral via [place]; member discharged from admission to [facility] 10/24/2017>  Telephone call attempt x 1; left message on voice mail requesting call back.  Plan: Follow up in 2-4 business days. Geophysicist/field seismologist.  Sherrin Daisy, RN BSN [place] Management Coordinator [facility] Care Management  [phone]

## 2017-11-06 ENCOUNTER — Encounter: Payer: Self-pay | Admitting: *Deleted

## 2017-11-09 ENCOUNTER — Other Ambulatory Visit: Payer: Self-pay | Admitting: *Deleted

## 2017-11-09 NOTE — Patient Outreach (Signed)
[facility] Care Management  11/09/2017  Jheremy Boger Hauswirth Aug 23, 1940 [id number]  Referral via [place]; member discharged from admission to [facility] 10/24/2017>  Telephone call x 2; left HIPPA compliant voice mail.  Plan: Follow up 2-4 business days. Outreach letter was sent 11/03/2017.  Sherrin Daisy, RN BSN [place] Management Coordinator [facility] Care Management  [phone]

## 2017-11-16 ENCOUNTER — Other Ambulatory Visit: Payer: Self-pay | Admitting: *Deleted

## 2017-11-16 NOTE — Patient Outreach (Signed)
[facility]) Care Management  11/16/2017  Jerry English [place] [birth year] [id number]  Referral via [facility]; member discharged from admission to [facility] 10/24/2017>  Telephone call #3 attempt; left HIPPA compliant voice mail requesting call back.  Plan: Outreach letter sent 8/9 Will follow up.Jerry Daisy, RN BSN [place] Management Coordinator [facility] Care Management  [phone]

## 2017-11-20 DIAGNOSIS — E782 Mixed hyperlipidemia: Secondary | ICD-10-CM | POA: Diagnosis not present

## 2017-11-20 DIAGNOSIS — I480 Paroxysmal atrial fibrillation: Secondary | ICD-10-CM | POA: Diagnosis not present

## 2017-11-20 DIAGNOSIS — I10 Essential (primary) hypertension: Secondary | ICD-10-CM | POA: Diagnosis not present

## 2017-11-20 DIAGNOSIS — I2510 Atherosclerotic heart disease of native coronary artery without angina pectoris: Secondary | ICD-10-CM | POA: Diagnosis not present

## 2017-11-20 DIAGNOSIS — N183 Chronic kidney disease, stage 3 (moderate): Secondary | ICD-10-CM | POA: Diagnosis not present

## 2017-11-20 DIAGNOSIS — Z125 Encounter for screening for malignant neoplasm of prostate: Secondary | ICD-10-CM | POA: Diagnosis not present

## 2017-11-24 ENCOUNTER — Other Ambulatory Visit: Payer: Self-pay | Admitting: *Deleted

## 2017-11-24 NOTE — Patient Outreach (Signed)
[place] [facility]) Care Management  11/24/2017  Jerry English 1940-10-20 [id number]  Unsuccessful outreach attempts.  Plan: Case closure.  Sherrin Daisy, RN BSN [place] Management Coordinator [facility] Care Management  [phone]

## 2017-12-26 DIAGNOSIS — H401112 Primary open-angle glaucoma, right eye, moderate stage: Secondary | ICD-10-CM | POA: Diagnosis not present

## 2018-01-02 DIAGNOSIS — I4819 Other persistent atrial fibrillation: Secondary | ICD-10-CM | POA: Diagnosis not present

## 2018-01-31 DIAGNOSIS — I10 Essential (primary) hypertension: Secondary | ICD-10-CM | POA: Diagnosis not present

## 2018-01-31 DIAGNOSIS — I480 Paroxysmal atrial fibrillation: Secondary | ICD-10-CM | POA: Diagnosis not present

## 2018-01-31 DIAGNOSIS — Z95810 Presence of automatic (implantable) cardiac defibrillator: Secondary | ICD-10-CM | POA: Diagnosis not present

## 2018-01-31 DIAGNOSIS — I2510 Atherosclerotic heart disease of native coronary artery without angina pectoris: Secondary | ICD-10-CM | POA: Diagnosis not present

## 2018-05-03 DIAGNOSIS — I480 Paroxysmal atrial fibrillation: Secondary | ICD-10-CM | POA: Diagnosis not present

## 2018-05-03 DIAGNOSIS — I2510 Atherosclerotic heart disease of native coronary artery without angina pectoris: Secondary | ICD-10-CM | POA: Diagnosis not present

## 2018-05-03 DIAGNOSIS — T82198A Other mechanical complication of other cardiac electronic device, initial encounter: Secondary | ICD-10-CM | POA: Diagnosis not present

## 2018-05-03 DIAGNOSIS — I10 Essential (primary) hypertension: Secondary | ICD-10-CM | POA: Diagnosis not present

## 2018-05-03 DIAGNOSIS — I5022 Chronic systolic (congestive) heart failure: Secondary | ICD-10-CM | POA: Diagnosis not present

## 2018-05-03 DIAGNOSIS — E782 Mixed hyperlipidemia: Secondary | ICD-10-CM | POA: Diagnosis not present

## 2018-05-08 DIAGNOSIS — I480 Paroxysmal atrial fibrillation: Secondary | ICD-10-CM | POA: Diagnosis not present

## 2018-05-23 DIAGNOSIS — Z125 Encounter for screening for malignant neoplasm of prostate: Secondary | ICD-10-CM | POA: Diagnosis not present

## 2018-05-23 DIAGNOSIS — E782 Mixed hyperlipidemia: Secondary | ICD-10-CM | POA: Diagnosis not present

## 2018-05-23 DIAGNOSIS — I10 Essential (primary) hypertension: Secondary | ICD-10-CM | POA: Diagnosis not present

## 2018-05-23 DIAGNOSIS — I480 Paroxysmal atrial fibrillation: Secondary | ICD-10-CM | POA: Diagnosis not present

## 2018-05-25 DIAGNOSIS — I2510 Atherosclerotic heart disease of native coronary artery without angina pectoris: Secondary | ICD-10-CM | POA: Diagnosis not present

## 2018-05-25 DIAGNOSIS — I2699 Other pulmonary embolism without acute cor pulmonale: Secondary | ICD-10-CM | POA: Diagnosis not present

## 2018-05-25 DIAGNOSIS — Z125 Encounter for screening for malignant neoplasm of prostate: Secondary | ICD-10-CM | POA: Diagnosis not present

## 2018-05-25 DIAGNOSIS — Z0000 Encounter for general adult medical examination without abnormal findings: Secondary | ICD-10-CM | POA: Diagnosis not present

## 2018-05-25 DIAGNOSIS — N183 Chronic kidney disease, stage 3 (moderate): Secondary | ICD-10-CM | POA: Diagnosis not present

## 2018-05-25 DIAGNOSIS — J329 Chronic sinusitis, unspecified: Secondary | ICD-10-CM | POA: Diagnosis not present

## 2018-05-25 DIAGNOSIS — Z0001 Encounter for general adult medical examination with abnormal findings: Secondary | ICD-10-CM | POA: Diagnosis not present

## 2018-05-25 DIAGNOSIS — I480 Paroxysmal atrial fibrillation: Secondary | ICD-10-CM | POA: Diagnosis not present

## 2018-05-25 DIAGNOSIS — E782 Mixed hyperlipidemia: Secondary | ICD-10-CM | POA: Diagnosis not present

## 2018-05-25 DIAGNOSIS — I10 Essential (primary) hypertension: Secondary | ICD-10-CM | POA: Diagnosis not present

## 2018-05-25 DIAGNOSIS — E039 Hypothyroidism, unspecified: Secondary | ICD-10-CM | POA: Diagnosis not present

## 2018-08-02 DIAGNOSIS — I10 Essential (primary) hypertension: Secondary | ICD-10-CM | POA: Diagnosis not present

## 2018-08-02 DIAGNOSIS — E782 Mixed hyperlipidemia: Secondary | ICD-10-CM | POA: Diagnosis not present

## 2018-08-02 DIAGNOSIS — I480 Paroxysmal atrial fibrillation: Secondary | ICD-10-CM | POA: Diagnosis not present

## 2018-08-02 DIAGNOSIS — Z951 Presence of aortocoronary bypass graft: Secondary | ICD-10-CM | POA: Diagnosis not present

## 2018-08-02 DIAGNOSIS — I2510 Atherosclerotic heart disease of native coronary artery without angina pectoris: Secondary | ICD-10-CM | POA: Diagnosis not present

## 2018-08-02 DIAGNOSIS — I5022 Chronic systolic (congestive) heart failure: Secondary | ICD-10-CM | POA: Diagnosis not present

## 2018-08-02 DIAGNOSIS — Z95810 Presence of automatic (implantable) cardiac defibrillator: Secondary | ICD-10-CM | POA: Diagnosis not present

## 2018-08-02 DIAGNOSIS — N183 Chronic kidney disease, stage 3 (moderate): Secondary | ICD-10-CM | POA: Diagnosis not present

## 2018-08-02 DIAGNOSIS — Z7901 Long term (current) use of anticoagulants: Secondary | ICD-10-CM | POA: Diagnosis not present

## 2018-08-07 DIAGNOSIS — I5022 Chronic systolic (congestive) heart failure: Secondary | ICD-10-CM | POA: Diagnosis not present

## 2018-08-24 DIAGNOSIS — E039 Hypothyroidism, unspecified: Secondary | ICD-10-CM | POA: Diagnosis not present

## 2018-09-05 DIAGNOSIS — N183 Chronic kidney disease, stage 3 (moderate): Secondary | ICD-10-CM | POA: Diagnosis not present

## 2018-09-05 DIAGNOSIS — Z79899 Other long term (current) drug therapy: Secondary | ICD-10-CM | POA: Diagnosis not present

## 2018-09-05 DIAGNOSIS — K210 Gastro-esophageal reflux disease with esophagitis: Secondary | ICD-10-CM | POA: Diagnosis not present

## 2018-09-05 DIAGNOSIS — Z951 Presence of aortocoronary bypass graft: Secondary | ICD-10-CM | POA: Diagnosis not present

## 2018-09-05 DIAGNOSIS — Z95810 Presence of automatic (implantable) cardiac defibrillator: Secondary | ICD-10-CM | POA: Diagnosis not present

## 2018-09-05 DIAGNOSIS — I480 Paroxysmal atrial fibrillation: Secondary | ICD-10-CM | POA: Diagnosis not present

## 2018-09-05 DIAGNOSIS — R1310 Dysphagia, unspecified: Secondary | ICD-10-CM | POA: Diagnosis not present

## 2018-09-05 DIAGNOSIS — Z86010 Personal history of colonic polyps: Secondary | ICD-10-CM | POA: Diagnosis not present

## 2018-09-10 DIAGNOSIS — H401121 Primary open-angle glaucoma, left eye, mild stage: Secondary | ICD-10-CM | POA: Diagnosis not present

## 2018-09-18 DIAGNOSIS — H401121 Primary open-angle glaucoma, left eye, mild stage: Secondary | ICD-10-CM | POA: Diagnosis not present

## 2018-09-28 ENCOUNTER — Other Ambulatory Visit: Payer: Self-pay

## 2018-09-28 ENCOUNTER — Other Ambulatory Visit
Admission: RE | Admit: 2018-09-28 | Discharge: 2018-09-28 | Disposition: A | Discharge status: Home / Self Care | Payer: PPO | Source: Ambulatory Visit | Attending: Internal Medicine | Admitting: Internal Medicine

## 2018-09-28 DIAGNOSIS — Z1159 Encounter for screening for other viral diseases: Secondary | ICD-10-CM | POA: Diagnosis not present

## 2018-09-28 DIAGNOSIS — Z01812 Encounter for preprocedural laboratory examination: Secondary | ICD-10-CM | POA: Insufficient documentation

## 2018-09-28 LAB — SARS CORONAVIRUS 2 (TAT 6-24 HRS): SARS Coronavirus 2: NEGATIVE

## 2018-10-02 ENCOUNTER — Encounter: Payer: Self-pay | Admitting: *Deleted

## 2018-10-03 ENCOUNTER — Other Ambulatory Visit: Payer: Self-pay

## 2018-10-03 ENCOUNTER — Encounter: Payer: Self-pay | Admitting: *Deleted

## 2018-10-03 ENCOUNTER — Encounter
Admission: RE | Disposition: A | Discharge status: Home / Self Care | Payer: Self-pay | Source: Home / Self Care | Attending: Internal Medicine

## 2018-10-03 ENCOUNTER — Ambulatory Visit
Admission: RE | Admit: 2018-10-03 | Discharge: 2018-10-03 | Disposition: A | Discharge status: Home / Self Care | Payer: PPO | Attending: Internal Medicine | Admitting: Internal Medicine

## 2018-10-03 ENCOUNTER — Ambulatory Visit: Payer: PPO | Admitting: Anesthesiology

## 2018-10-03 DIAGNOSIS — H409 Unspecified glaucoma: Secondary | ICD-10-CM | POA: Insufficient documentation

## 2018-10-03 DIAGNOSIS — K449 Diaphragmatic hernia without obstruction or gangrene: Secondary | ICD-10-CM | POA: Diagnosis not present

## 2018-10-03 DIAGNOSIS — E039 Hypothyroidism, unspecified: Secondary | ICD-10-CM | POA: Diagnosis not present

## 2018-10-03 DIAGNOSIS — I739 Peripheral vascular disease, unspecified: Secondary | ICD-10-CM | POA: Diagnosis not present

## 2018-10-03 DIAGNOSIS — K5790 Diverticulosis of intestine, part unspecified, without perforation or abscess without bleeding: Secondary | ICD-10-CM | POA: Insufficient documentation

## 2018-10-03 DIAGNOSIS — I130 Hypertensive heart and chronic kidney disease with heart failure and stage 1 through stage 4 chronic kidney disease, or unspecified chronic kidney disease: Secondary | ICD-10-CM | POA: Insufficient documentation

## 2018-10-03 DIAGNOSIS — I5022 Chronic systolic (congestive) heart failure: Secondary | ICD-10-CM | POA: Diagnosis not present

## 2018-10-03 DIAGNOSIS — L648 Other androgenic alopecia: Secondary | ICD-10-CM | POA: Diagnosis not present

## 2018-10-03 DIAGNOSIS — K222 Esophageal obstruction: Secondary | ICD-10-CM | POA: Insufficient documentation

## 2018-10-03 DIAGNOSIS — Z86010 Personal history of colonic polyps: Secondary | ICD-10-CM | POA: Diagnosis not present

## 2018-10-03 DIAGNOSIS — K219 Gastro-esophageal reflux disease without esophagitis: Secondary | ICD-10-CM | POA: Diagnosis not present

## 2018-10-03 DIAGNOSIS — E785 Hyperlipidemia, unspecified: Secondary | ICD-10-CM | POA: Diagnosis not present

## 2018-10-03 DIAGNOSIS — I509 Heart failure, unspecified: Secondary | ICD-10-CM | POA: Insufficient documentation

## 2018-10-03 DIAGNOSIS — Z951 Presence of aortocoronary bypass graft: Secondary | ICD-10-CM | POA: Insufficient documentation

## 2018-10-03 DIAGNOSIS — Z86711 Personal history of pulmonary embolism: Secondary | ICD-10-CM | POA: Insufficient documentation

## 2018-10-03 DIAGNOSIS — Z7982 Long term (current) use of aspirin: Secondary | ICD-10-CM | POA: Insufficient documentation

## 2018-10-03 DIAGNOSIS — Z8673 Personal history of transient ischemic attack (TIA), and cerebral infarction without residual deficits: Secondary | ICD-10-CM | POA: Diagnosis not present

## 2018-10-03 DIAGNOSIS — Z7901 Long term (current) use of anticoagulants: Secondary | ICD-10-CM | POA: Diagnosis not present

## 2018-10-03 DIAGNOSIS — K640 First degree hemorrhoids: Secondary | ICD-10-CM | POA: Insufficient documentation

## 2018-10-03 DIAGNOSIS — N183 Chronic kidney disease, stage 3 (moderate): Secondary | ICD-10-CM | POA: Insufficient documentation

## 2018-10-03 DIAGNOSIS — I252 Old myocardial infarction: Secondary | ICD-10-CM | POA: Insufficient documentation

## 2018-10-03 DIAGNOSIS — Z09 Encounter for follow-up examination after completed treatment for conditions other than malignant neoplasm: Secondary | ICD-10-CM | POA: Diagnosis not present

## 2018-10-03 DIAGNOSIS — K5730 Diverticulosis of large intestine without perforation or abscess without bleeding: Secondary | ICD-10-CM | POA: Diagnosis not present

## 2018-10-03 DIAGNOSIS — R1310 Dysphagia, unspecified: Secondary | ICD-10-CM | POA: Insufficient documentation

## 2018-10-03 DIAGNOSIS — Z79899 Other long term (current) drug therapy: Secondary | ICD-10-CM | POA: Insufficient documentation

## 2018-10-03 DIAGNOSIS — Z96652 Presence of left artificial knee joint: Secondary | ICD-10-CM | POA: Insufficient documentation

## 2018-10-03 DIAGNOSIS — I2510 Atherosclerotic heart disease of native coronary artery without angina pectoris: Secondary | ICD-10-CM | POA: Insufficient documentation

## 2018-10-03 DIAGNOSIS — I480 Paroxysmal atrial fibrillation: Secondary | ICD-10-CM | POA: Diagnosis not present

## 2018-10-03 DIAGNOSIS — Z95810 Presence of automatic (implantable) cardiac defibrillator: Secondary | ICD-10-CM | POA: Diagnosis not present

## 2018-10-03 HISTORY — DX: Deficiency of other specified B group vitamins: E53.8

## 2018-10-03 HISTORY — DX: Paroxysmal atrial fibrillation: I48.0

## 2018-10-03 HISTORY — PX: ESOPHAGOGASTRODUODENOSCOPY (EGD) WITH PROPOFOL: SHX5813

## 2018-10-03 HISTORY — DX: Epistaxis: R04.0

## 2018-10-03 HISTORY — PX: COLONOSCOPY WITH PROPOFOL: SHX5780

## 2018-10-03 HISTORY — DX: Cellulitis of chest wall: L03.313

## 2018-10-03 HISTORY — DX: Hyperlipidemia, unspecified: E78.5

## 2018-10-03 HISTORY — DX: Chronic kidney disease, stage 3 unspecified: N18.30

## 2018-10-03 HISTORY — DX: Other pulmonary embolism without acute cor pulmonale: I26.99

## 2018-10-03 SURGERY — COLONOSCOPY WITH PROPOFOL
Anesthesia: General

## 2018-10-03 MED ORDER — PROPOFOL 10 MG/ML IV BOLUS
INTRAVENOUS | Status: DC | PRN
  Administered 2018-10-03: 20 mg via INTRAVENOUS
  Administered 2018-10-03: 50 mg via INTRAVENOUS

## 2018-10-03 MED ORDER — PROPOFOL 500 MG/50ML IV EMUL
INTRAVENOUS | Status: AC
  Filled 2018-10-03: qty 50

## 2018-10-03 MED ORDER — GLYCOPYRROLATE 0.2 MG/ML IJ SOLN
INTRAMUSCULAR | Status: AC
  Filled 2018-10-03: qty 1

## 2018-10-03 MED ORDER — LIDOCAINE HCL (CARDIAC) PF 100 MG/5ML IV SOSY
PREFILLED_SYRINGE | INTRAVENOUS | Status: DC | PRN
  Administered 2018-10-03: 60 mg via INTRAVENOUS

## 2018-10-03 MED ORDER — EPHEDRINE SULFATE 50 MG/ML IJ SOLN
INTRAMUSCULAR | Status: DC | PRN
  Administered 2018-10-03 (×2): 5 mg via INTRAVENOUS

## 2018-10-03 MED ORDER — SODIUM CHLORIDE 0.9 % IV SOLN
INTRAVENOUS | Status: DC
  Administered 2018-10-03: 08:00:00 1000 mL via INTRAVENOUS

## 2018-10-03 MED ORDER — EPHEDRINE SULFATE 50 MG/ML IJ SOLN
INTRAMUSCULAR | Status: AC
  Filled 2018-10-03: qty 1

## 2018-10-03 MED ORDER — PHENYLEPHRINE HCL (PRESSORS) 10 MG/ML IV SOLN
INTRAVENOUS | Status: DC | PRN
  Administered 2018-10-03: 50 ug via INTRAVENOUS
  Administered 2018-10-03: 100 ug via INTRAVENOUS

## 2018-10-03 MED ORDER — LIDOCAINE HCL (PF) 2 % IJ SOLN
INTRAMUSCULAR | Status: AC
  Filled 2018-10-03: qty 10

## 2018-10-03 MED ORDER — PROPOFOL 500 MG/50ML IV EMUL
INTRAVENOUS | Status: DC | PRN
  Administered 2018-10-03: 100 ug/kg/min via INTRAVENOUS

## 2018-10-03 MED ORDER — PHENYLEPHRINE HCL (PRESSORS) 10 MG/ML IV SOLN
INTRAVENOUS | Status: AC
  Filled 2018-10-03: qty 1

## 2018-10-03 MED ORDER — GLYCOPYRROLATE 0.2 MG/ML IJ SOLN
INTRAMUSCULAR | Status: DC | PRN
  Administered 2018-10-03 (×2): 0.1 mg via INTRAVENOUS

## 2018-10-03 NOTE — Anesthesia Procedure Notes (Signed)
Date/Time: 10/03/2018 8:14 AM Performed by: Lavone Orn, CRNA Oxygen Delivery Method: Nasal cannula

## 2018-10-03 NOTE — Anesthesia Post-op Follow-up Note (Signed)
Anesthesia QCDR form completed.        
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
## 2018-10-03 NOTE — Op Note (Signed)
[facility] Gastroenterology Patient Name: Jerry English Procedure Date: 10/03/2018 7:22 AM MRN: [id number] Account #: [id number] Date of Birth: Mar 23, 1941 Admit Type: Outpatient Age: 78 Room: [facility] ENDO ROOM 3 Gender: Male Note Status: Finalized Procedure:            Colonoscopy Indications:          High risk colon cancer surveillance: Personal history                        of colonic polyps Providers:            Benay Pike. Awais Cobarrubias MD, MD Medicines:            Propofol per Anesthesia Complications:        No immediate complications. Procedure:            Pre-Anesthesia Assessment:                       - The risks and benefits of the procedure and the                        sedation options and risks were discussed with the                        patient. All questions were answered and informed                        consent was obtained.                       - Patient identification and proposed procedure were                        verified prior to the procedure by the nurse. The                        procedure was verified in the procedure room.                       - ASA Grade Assessment: III - A patient with severe                        systemic disease.                       - After reviewing the risks and benefits, the patient                        was deemed in satisfactory condition to undergo the                        procedure.                       After obtaining informed consent, the colonoscope was                        passed under direct vision. Throughout the procedure,                        the patient's blood pressure, pulse, and oxygen  saturations were monitored continuously. The                        Colonoscope was introduced through the anus and                        advanced to the the cecum, identified by appendiceal                        orifice and ileocecal valve. The colonoscopy was          performed without difficulty. The patient tolerated the                        procedure well. The quality of the bowel preparation                        was good. The ileocecal valve, appendiceal orifice, and                        rectum were photographed. Findings:      The perianal and digital rectal examinations were normal. Pertinent       negatives include normal sphincter tone and no palpable rectal lesions.      Many small-mouthed diverticula were found in the left colon.      Non-bleeding internal hemorrhoids were found during retroflexion. The       hemorrhoids were Grade I (internal hemorrhoids that do not prolapse).      The exam was otherwise without abnormality. Impression:           - Diverticulosis in the left colon.                       - Non-bleeding internal hemorrhoids.                       - The examination was otherwise normal.                       - No specimens collected. Recommendation:       - Patient has a contact number available for                        emergencies. The signs and symptoms of potential                        delayed complications were discussed with the patient.                        Return to normal activities tomorrow. Written discharge                        instructions were provided to the patient.                       - Resume previous diet.                       - Continue present medications.                       - No repeat colonoscopy due to age and the absence  of                        colonic polyps.                       - Return to physician assistant in 2 months.                       - The findings and recommendations were discussed with                        the patient. Procedure Code(s):    --- Professional ---                       A8341, Colorectal cancer screening; colonoscopy on                        individual at high risk Diagnosis Code(s):    --- Professional ---                       K57.30,  Diverticulosis of large intestine without                        perforation or abscess without bleeding                       K64.0, First degree hemorrhoids                       Z86.010, Personal history of colonic polyps CPT copyright 2019 American Medical Association. All rights reserved. The codes documented in this report are preliminary and upon coder review may  be revised to meet current compliance requirements. Efrain Sella MD, MD 10/03/2018 8:38:30 AM This report has been signed electronically. Number of Addenda: 0 Note Initiated On: 10/03/2018 7:22 AM Scope Withdrawal Time: 0 hours 6 minutes 11 seconds  Total Procedure Duration: 0 hours 9 minutes 46 seconds  Estimated Blood Loss: Estimated blood loss: none.      [facility]

## 2018-10-03 NOTE — Op Note (Addendum)
[facility] Gastroenterology Patient Name: Jerry English Procedure Date: 10/03/2018 7:23 AM MRN: [id number] Account #: [id number] Date of Birth: 08/25/1940 Admit Type: Outpatient Age: 78 Room: [facility] ENDO ROOM 3 Gender: Male Note Status: Finalized Procedure:            Upper GI endoscopy Indications:          Dysphagia, Follow-up of esophageal reflux Providers:            Benay Pike. Alice Reichert MD, MD Referring MD:         Baxter Hire, MD (Referring MD) Medicines:            Propofol per Anesthesia Complications:        No immediate complications. Procedure:            Pre-Anesthesia Assessment:                       - The risks and benefits of the procedure and the                        sedation options and risks were discussed with the                        patient. All questions were answered and informed                        consent was obtained.                       - The risks and benefits of the procedure and the                        sedation options and risks were discussed with the                        patient. All questions were answered and informed                        consent was obtained.                       - ASA Grade Assessment: III - A patient with severe                        systemic disease.                       - After reviewing the risks and benefits, the patient                        was deemed in satisfactory condition to undergo the                        procedure.                       After obtaining informed consent, the endoscope was                        passed under direct vision. Throughout the procedure,  the patient's blood pressure, pulse, and oxygen                        saturations were monitored continuously. The Endoscope                        was introduced through the mouth, and advanced to the                        third part of duodenum. The upper GI endoscopy was                         accomplished without difficulty. The patient tolerated                        the procedure well. Findings:      One benign-appearing, intrinsic mild stenosis was found in the distal       esophagus. This stenosis measured 1.4 cm (inner diameter) x less than       one cm (in length). The stenosis was traversed. The scope was withdrawn.       Dilation was performed with a Maloney dilator with mild resistance at 33       Fr.      A 1 cm hiatal hernia was present.      The examined duodenum was normal.      The exam was otherwise without abnormality. Impression:           - Benign-appearing esophageal stenosis. Dilated.                       - 1 cm hiatal hernia.                       - Normal examined duodenum.                       - The examination was otherwise normal.                       - No specimens collected. Recommendation:       - Monitor results to esophageal dilation                       - Proceed with colonoscopy Procedure Code(s):    --- Professional ---                       [phone], Esophagogastroduodenoscopy, flexible, transoral;                        diagnostic, including collection of specimen(s) by                        brushing or washing, when performed (separate procedure)                       43450, Dilation of esophagus, by unguided sound or                        bougie, single or multiple passes Diagnosis Code(s):    --- Professional ---  K21.9, Gastro-esophageal reflux disease without                        esophagitis                       R13.10, Dysphagia, unspecified                       K44.9, Diaphragmatic hernia without obstruction or                        gangrene                       K22.2, Esophageal obstruction CPT copyright 2019 American Medical Association. All rights reserved. The codes documented in this report are preliminary and upon coder review may  be revised to meet current compliance  requirements. Efrain Sella MD, MD 10/03/2018 8:38:55 AM This report has been signed electronically. Number of Addenda: 0 Note Initiated On: 10/03/2018 7:23 AM      [facility]

## 2018-10-03 NOTE — Interval H&P Note (Signed)
History and Physical Interval Note:  10/03/2018 8:11 AM  Jerry English  has presented today for surgery, with the diagnosis of PH colon polyps Gerd Dysphagia.  The various methods of treatment have been discussed with the patient and family. After consideration of risks, benefits and other options for treatment, the patient has consented to  Procedure(s): COLONOSCOPY WITH PROPOFOL (N/A) ESOPHAGOGASTRODUODENOSCOPY (EGD) WITH PROPOFOL (N/A) as a surgical intervention.  The patient's history has been reviewed, patient examined, no change in status, stable for surgery.  I have reviewed the patient's chart and labs.  Questions were answered to the patient's satisfaction.     [place], [place]

## 2018-10-03 NOTE — H&P (Signed)
Outpatient short stay form Pre-procedure 10/03/2018 8:10 AM  Jerry English, M.D.  Primary Physician: Harrel Lemon, M.D.  Reason for visit:  GERD, dysphagia, personal hx of colon polyps.  History of present illness:  Patient with intermittent solid food dysphagia and GERD, the latter controlled with medication. Has personal hx of colon polyps.    Current Facility-Administered Medications:  .  0.9 %  sodium chloride infusion, , Intravenous, Continuous, [place], Benay Pike, MD, Last Rate: 20 mL/hr at 10/03/18 0732, 1,000 mL at 10/03/18 0732  Medications Prior to Admission  Medication Sig Dispense Refill Last Dose  . Acetaminophen (TYLENOL PO) Take 500-1,000 mg by mouth every 4 (four) hours as needed (for Fever).     Marland Kitchen amiodarone (PACERONE) 100 MG tablet Take 100 mg by mouth every morning.      Marland Kitchen aspirin EC 81 MG tablet Take 162 mg by mouth every morning.     . dorzolamide (TRUSOPT) 2 % ophthalmic solution Place into both eyes 2 (two) times daily.     . furosemide (LASIX) 20 MG tablet Take 20 mg by mouth every morning.     Marland Kitchen guaiFENesin-dextromethorphan (ROBITUSSIN DM) 100-10 MG/5ML syrup Take 5 mLs by mouth every 4 (four) hours as needed for cough. (Patient not taking: Reported on 09/23/2016) 236 mL 0   . latanoprost (XALATAN) 0.005 % ophthalmic solution Place 1 drop into both eyes at bedtime.     Marland Kitchen losartan (COZAAR) 50 MG tablet Take 50 mg by mouth daily.     Marland Kitchen oseltamivir (TAMIFLU) 30 MG capsule Take 1 capsule (30 mg total) by mouth 2 (two) times daily. (Patient not taking: Reported on 09/23/2016) 8 capsule 0   . pantoprazole (PROTONIX) 40 MG tablet Take 40 mg by mouth daily.     . rivaroxaban (XARELTO) 20 MG TABS tablet Take 1 tablet (20 mg total) by mouth daily with supper. After 15 mg po bid for 18 days, take 20 mg po daily 30 tablet 2 09/29/2018  . simvastatin (ZOCOR) 40 MG tablet Take 40 mg by mouth daily.      . timolol (TIMOPTIC) 0.5 % ophthalmic solution Place 1 drop into both eyes 2  (two) times daily.        Allergies  Allergen Reactions  . Carvedilol Other (See Comments)    Hallucinations      Past Medical History:  Diagnosis Date  . AICD (automatic cardioverter/defibrillator) present   . Cellulitis of chest wall   . CHF (congestive heart failure) ([place])   . CKD (chronic kidney disease) stage 3, GFR 30-59 ml/min (HCC)   . Coronary artery disease   . Depression   . Epistaxis   . GERD (gastroesophageal reflux disease)   . Glaucoma   . Hyperlipemia   . Hypertension   . Hypothyroidism   . Myocardial infarction ([place])   . PAF (paroxysmal atrial fibrillation) ([place])   . Peripheral vascular disease ([place])   . Presence of combination internal cardiac defibrillator (ICD) and pacemaker   . Presence of permanent cardiac pacemaker   . Pulmonary embolism without acute cor pulmonale ([place])   . Seizures ([place])   . Stroke ([place])   . Vitamin B 12 deficiency     Review of systems:  Otherwise negative.    Physical Exam  Gen: Alert, oriented. Appears stated age.  HEENT: [place]/AT. PERRLA. Lungs: CTA, no wheezes. CV: RR nl S1, S2. Abd: soft, benign, no masses. BS+ Ext: No edema. Pulses 2+    Planned procedures: Proceed with  EGD and colonoscopy. The patient understands the nature of the planned procedure, indications, risks, alternatives and potential complications including but not limited to bleeding, infection, perforation, damage to internal organs and possible oversedation/side effects from anesthesia. The patient agrees and gives consent to proceed.  Please refer to procedure notes for findings, recommendations and patient disposition/instructions.      Jerry English, M.D. Gastroenterology 10/03/2018  8:10 AM

## 2018-10-03 NOTE — Transfer of Care (Signed)
Immediate Anesthesia Transfer of Care Note  Patient: Jerry English  Procedure(s) Performed: COLONOSCOPY WITH PROPOFOL (N/A ) ESOPHAGOGASTRODUODENOSCOPY (EGD) WITH PROPOFOL (N/A )  Patient Location: PACU  Anesthesia Type:General  Level of Consciousness: awake  Airway & Oxygen Therapy: Patient Spontanous Breathing and Patient connected to nasal cannula oxygen  Post-op Assessment: Report given to RN and Post -op Vital signs reviewed and stable  Post vital signs: stable  Last Vitals:  Vitals Value Taken Time  BP 90/53 10/03/18 0842  Temp    Pulse 60 10/03/18 0843  Resp 21 10/03/18 0843  SpO2 99 % 10/03/18 0843  Vitals shown include unvalidated device data.  Last Pain:  Vitals:   10/03/18 0713  TempSrc: Tympanic  PainSc: 0-No pain         Complications: No apparent anesthesia complications

## 2018-10-04 NOTE — Anesthesia Postprocedure Evaluation (Signed)
Anesthesia Post Note  Patient: Jerry English  Procedure(s) Performed: COLONOSCOPY WITH PROPOFOL (N/A ) ESOPHAGOGASTRODUODENOSCOPY (EGD) WITH PROPOFOL (N/A )  Patient location during evaluation: PACU Anesthesia Type: General Level of consciousness: awake and alert Pain management: pain level controlled Vital Signs Assessment: post-procedure vital signs reviewed and stable Respiratory status: spontaneous breathing, nonlabored ventilation and respiratory function stable Cardiovascular status: blood pressure returned to baseline and stable Postop Assessment: no apparent nausea or vomiting Anesthetic complications: no     Last Vitals:  Vitals:   10/03/18 0900 10/03/18 0910  BP: 117/70 118/65  Pulse: (!) 59 (!) 59  Resp: 15 16  Temp:    SpO2: 96% 99%    Last Pain:  Vitals:   10/03/18 0910  TempSrc:   PainSc: 0-No pain                 Durenda Hurt

## 2018-11-06 DIAGNOSIS — I5022 Chronic systolic (congestive) heart failure: Secondary | ICD-10-CM | POA: Diagnosis not present

## 2018-11-16 DIAGNOSIS — I10 Essential (primary) hypertension: Secondary | ICD-10-CM | POA: Diagnosis not present

## 2018-11-16 DIAGNOSIS — E039 Hypothyroidism, unspecified: Secondary | ICD-10-CM | POA: Diagnosis not present

## 2018-11-16 DIAGNOSIS — Z125 Encounter for screening for malignant neoplasm of prostate: Secondary | ICD-10-CM | POA: Diagnosis not present

## 2018-11-16 DIAGNOSIS — R972 Elevated prostate specific antigen [PSA]: Secondary | ICD-10-CM | POA: Diagnosis not present

## 2018-11-23 DIAGNOSIS — E039 Hypothyroidism, unspecified: Secondary | ICD-10-CM | POA: Diagnosis not present

## 2018-11-23 DIAGNOSIS — Z7901 Long term (current) use of anticoagulants: Secondary | ICD-10-CM | POA: Diagnosis not present

## 2018-11-23 DIAGNOSIS — Z79899 Other long term (current) drug therapy: Secondary | ICD-10-CM | POA: Diagnosis not present

## 2018-11-23 DIAGNOSIS — I5022 Chronic systolic (congestive) heart failure: Secondary | ICD-10-CM | POA: Diagnosis not present

## 2018-11-23 DIAGNOSIS — Z862 Personal history of diseases of the blood and blood-forming organs and certain disorders involving the immune mechanism: Secondary | ICD-10-CM | POA: Diagnosis not present

## 2018-11-23 DIAGNOSIS — N183 Chronic kidney disease, stage 3 (moderate): Secondary | ICD-10-CM | POA: Diagnosis not present

## 2018-11-23 DIAGNOSIS — E538 Deficiency of other specified B group vitamins: Secondary | ICD-10-CM | POA: Diagnosis not present

## 2018-11-23 DIAGNOSIS — I2510 Atherosclerotic heart disease of native coronary artery without angina pectoris: Secondary | ICD-10-CM | POA: Diagnosis not present

## 2018-12-04 DIAGNOSIS — Z95810 Presence of automatic (implantable) cardiac defibrillator: Secondary | ICD-10-CM | POA: Diagnosis not present

## 2018-12-04 DIAGNOSIS — K219 Gastro-esophageal reflux disease without esophagitis: Secondary | ICD-10-CM | POA: Diagnosis not present

## 2018-12-04 DIAGNOSIS — I480 Paroxysmal atrial fibrillation: Secondary | ICD-10-CM | POA: Diagnosis not present

## 2018-12-04 DIAGNOSIS — Z951 Presence of aortocoronary bypass graft: Secondary | ICD-10-CM | POA: Diagnosis not present

## 2018-12-04 DIAGNOSIS — N183 Chronic kidney disease, stage 3 (moderate): Secondary | ICD-10-CM | POA: Diagnosis not present

## 2018-12-04 DIAGNOSIS — Z9889 Other specified postprocedural states: Secondary | ICD-10-CM | POA: Diagnosis not present

## 2018-12-19 DIAGNOSIS — E039 Hypothyroidism, unspecified: Secondary | ICD-10-CM | POA: Diagnosis not present

## 2018-12-19 DIAGNOSIS — I2510 Atherosclerotic heart disease of native coronary artery without angina pectoris: Secondary | ICD-10-CM | POA: Diagnosis not present

## 2018-12-19 DIAGNOSIS — Z951 Presence of aortocoronary bypass graft: Secondary | ICD-10-CM | POA: Diagnosis not present

## 2018-12-19 DIAGNOSIS — N183 Chronic kidney disease, stage 3 (moderate): Secondary | ICD-10-CM | POA: Diagnosis not present

## 2018-12-19 DIAGNOSIS — I10 Essential (primary) hypertension: Secondary | ICD-10-CM | POA: Diagnosis not present

## 2018-12-19 DIAGNOSIS — I2699 Other pulmonary embolism without acute cor pulmonale: Secondary | ICD-10-CM | POA: Diagnosis not present

## 2018-12-19 DIAGNOSIS — I5022 Chronic systolic (congestive) heart failure: Secondary | ICD-10-CM | POA: Diagnosis not present

## 2018-12-19 DIAGNOSIS — I480 Paroxysmal atrial fibrillation: Secondary | ICD-10-CM | POA: Diagnosis not present

## 2018-12-19 DIAGNOSIS — Z95810 Presence of automatic (implantable) cardiac defibrillator: Secondary | ICD-10-CM | POA: Diagnosis not present

## 2019-01-04 DIAGNOSIS — E538 Deficiency of other specified B group vitamins: Secondary | ICD-10-CM | POA: Diagnosis not present

## 2019-01-12 DIAGNOSIS — S4991XA Unspecified injury of right shoulder and upper arm, initial encounter: Secondary | ICD-10-CM | POA: Diagnosis not present

## 2019-01-12 DIAGNOSIS — W010XXA Fall on same level from slipping, tripping and stumbling without subsequent striking against object, initial encounter: Secondary | ICD-10-CM | POA: Diagnosis not present

## 2019-01-12 DIAGNOSIS — M25511 Pain in right shoulder: Secondary | ICD-10-CM | POA: Diagnosis not present

## 2019-01-16 DIAGNOSIS — I420 Dilated cardiomyopathy: Secondary | ICD-10-CM | POA: Diagnosis not present

## 2019-01-16 DIAGNOSIS — M12811 Other specific arthropathies, not elsewhere classified, right shoulder: Secondary | ICD-10-CM | POA: Diagnosis not present

## 2019-01-16 DIAGNOSIS — M19011 Primary osteoarthritis, right shoulder: Secondary | ICD-10-CM | POA: Diagnosis not present

## 2019-01-16 DIAGNOSIS — M19019 Primary osteoarthritis, unspecified shoulder: Secondary | ICD-10-CM | POA: Diagnosis not present

## 2019-01-28 DIAGNOSIS — E785 Hyperlipidemia, unspecified: Secondary | ICD-10-CM | POA: Diagnosis not present

## 2019-01-28 DIAGNOSIS — I480 Paroxysmal atrial fibrillation: Secondary | ICD-10-CM | POA: Diagnosis not present

## 2019-01-28 DIAGNOSIS — E039 Hypothyroidism, unspecified: Secondary | ICD-10-CM | POA: Diagnosis not present

## 2019-01-28 DIAGNOSIS — T82198A Other mechanical complication of other cardiac electronic device, initial encounter: Secondary | ICD-10-CM | POA: Diagnosis not present

## 2019-01-28 DIAGNOSIS — I2699 Other pulmonary embolism without acute cor pulmonale: Secondary | ICD-10-CM | POA: Diagnosis not present

## 2019-01-28 DIAGNOSIS — Z79899 Other long term (current) drug therapy: Secondary | ICD-10-CM | POA: Diagnosis not present

## 2019-01-28 DIAGNOSIS — Z951 Presence of aortocoronary bypass graft: Secondary | ICD-10-CM | POA: Diagnosis not present

## 2019-01-28 DIAGNOSIS — Z01810 Encounter for preprocedural cardiovascular examination: Secondary | ICD-10-CM | POA: Diagnosis not present

## 2019-01-28 DIAGNOSIS — Z7901 Long term (current) use of anticoagulants: Secondary | ICD-10-CM | POA: Diagnosis not present

## 2019-01-28 DIAGNOSIS — I130 Hypertensive heart and chronic kidney disease with heart failure and stage 1 through stage 4 chronic kidney disease, or unspecified chronic kidney disease: Secondary | ICD-10-CM | POA: Diagnosis not present

## 2019-01-28 DIAGNOSIS — Z95810 Presence of automatic (implantable) cardiac defibrillator: Secondary | ICD-10-CM | POA: Diagnosis not present

## 2019-01-28 DIAGNOSIS — Z20828 Contact with and (suspected) exposure to other viral communicable diseases: Secondary | ICD-10-CM | POA: Diagnosis not present

## 2019-01-28 DIAGNOSIS — I2510 Atherosclerotic heart disease of native coronary artery without angina pectoris: Secondary | ICD-10-CM | POA: Diagnosis not present

## 2019-01-28 DIAGNOSIS — R195 Other fecal abnormalities: Secondary | ICD-10-CM | POA: Diagnosis not present

## 2019-01-28 DIAGNOSIS — N1831 Chronic kidney disease, stage 3a: Secondary | ICD-10-CM | POA: Diagnosis not present

## 2019-01-28 DIAGNOSIS — E538 Deficiency of other specified B group vitamins: Secondary | ICD-10-CM | POA: Diagnosis not present

## 2019-01-28 DIAGNOSIS — I5022 Chronic systolic (congestive) heart failure: Secondary | ICD-10-CM | POA: Diagnosis not present

## 2019-01-30 DIAGNOSIS — E039 Hypothyroidism, unspecified: Secondary | ICD-10-CM | POA: Diagnosis not present

## 2019-01-30 DIAGNOSIS — I480 Paroxysmal atrial fibrillation: Secondary | ICD-10-CM | POA: Diagnosis not present

## 2019-01-30 DIAGNOSIS — N183 Chronic kidney disease, stage 3 unspecified: Secondary | ICD-10-CM | POA: Diagnosis not present

## 2019-01-30 DIAGNOSIS — Z79899 Other long term (current) drug therapy: Secondary | ICD-10-CM | POA: Diagnosis not present

## 2019-01-30 DIAGNOSIS — I5022 Chronic systolic (congestive) heart failure: Secondary | ICD-10-CM | POA: Diagnosis not present

## 2019-01-30 DIAGNOSIS — Z4502 Encounter for adjustment and management of automatic implantable cardiac defibrillator: Secondary | ICD-10-CM | POA: Diagnosis not present

## 2019-01-30 DIAGNOSIS — I739 Peripheral vascular disease, unspecified: Secondary | ICD-10-CM | POA: Diagnosis not present

## 2019-01-30 DIAGNOSIS — Z95810 Presence of automatic (implantable) cardiac defibrillator: Secondary | ICD-10-CM | POA: Diagnosis not present

## 2019-01-30 DIAGNOSIS — I252 Old myocardial infarction: Secondary | ICD-10-CM | POA: Diagnosis not present

## 2019-01-30 DIAGNOSIS — I2510 Atherosclerotic heart disease of native coronary artery without angina pectoris: Secondary | ICD-10-CM | POA: Diagnosis not present

## 2019-01-30 DIAGNOSIS — I429 Cardiomyopathy, unspecified: Secondary | ICD-10-CM | POA: Diagnosis not present

## 2019-01-30 DIAGNOSIS — I255 Ischemic cardiomyopathy: Secondary | ICD-10-CM | POA: Diagnosis not present

## 2019-01-30 DIAGNOSIS — I130 Hypertensive heart and chronic kidney disease with heart failure and stage 1 through stage 4 chronic kidney disease, or unspecified chronic kidney disease: Secondary | ICD-10-CM | POA: Diagnosis not present

## 2019-01-30 DIAGNOSIS — I447 Left bundle-branch block, unspecified: Secondary | ICD-10-CM | POA: Diagnosis not present

## 2019-01-31 DIAGNOSIS — J9811 Atelectasis: Secondary | ICD-10-CM | POA: Diagnosis not present

## 2019-01-31 DIAGNOSIS — I429 Cardiomyopathy, unspecified: Secondary | ICD-10-CM | POA: Diagnosis not present

## 2019-01-31 DIAGNOSIS — Z95810 Presence of automatic (implantable) cardiac defibrillator: Secondary | ICD-10-CM | POA: Diagnosis not present

## 2019-02-18 DIAGNOSIS — I480 Paroxysmal atrial fibrillation: Secondary | ICD-10-CM | POA: Diagnosis not present

## 2019-02-18 DIAGNOSIS — I5022 Chronic systolic (congestive) heart failure: Secondary | ICD-10-CM | POA: Diagnosis not present

## 2019-02-18 DIAGNOSIS — I2510 Atherosclerotic heart disease of native coronary artery without angina pectoris: Secondary | ICD-10-CM | POA: Diagnosis not present

## 2019-02-18 DIAGNOSIS — N1831 Chronic kidney disease, stage 3a: Secondary | ICD-10-CM | POA: Diagnosis not present

## 2019-02-18 DIAGNOSIS — Z951 Presence of aortocoronary bypass graft: Secondary | ICD-10-CM | POA: Diagnosis not present

## 2019-02-18 DIAGNOSIS — Z79899 Other long term (current) drug therapy: Secondary | ICD-10-CM | POA: Diagnosis not present

## 2019-02-18 DIAGNOSIS — Z95810 Presence of automatic (implantable) cardiac defibrillator: Secondary | ICD-10-CM | POA: Diagnosis not present

## 2019-02-18 DIAGNOSIS — I2699 Other pulmonary embolism without acute cor pulmonale: Secondary | ICD-10-CM | POA: Diagnosis not present

## 2019-02-18 DIAGNOSIS — E785 Hyperlipidemia, unspecified: Secondary | ICD-10-CM | POA: Diagnosis not present

## 2019-02-18 DIAGNOSIS — I10 Essential (primary) hypertension: Secondary | ICD-10-CM | POA: Diagnosis not present

## 2019-02-18 DIAGNOSIS — T82198A Other mechanical complication of other cardiac electronic device, initial encounter: Secondary | ICD-10-CM | POA: Diagnosis not present

## 2019-03-01 ENCOUNTER — Other Ambulatory Visit: Payer: Self-pay | Admitting: Orthopedic Surgery

## 2019-03-01 DIAGNOSIS — M75101 Unspecified rotator cuff tear or rupture of right shoulder, not specified as traumatic: Secondary | ICD-10-CM

## 2019-03-01 DIAGNOSIS — M19011 Primary osteoarthritis, right shoulder: Secondary | ICD-10-CM

## 2019-03-01 DIAGNOSIS — M12811 Other specific arthropathies, not elsewhere classified, right shoulder: Secondary | ICD-10-CM

## 2019-03-14 ENCOUNTER — Ambulatory Visit
Admission: RE | Admit: 2019-03-14 | Discharge: 2019-03-14 | Disposition: A | Discharge status: Home / Self Care | Payer: PPO | Source: Ambulatory Visit | Attending: Orthopedic Surgery | Admitting: Orthopedic Surgery

## 2019-03-14 ENCOUNTER — Other Ambulatory Visit: Payer: Self-pay

## 2019-03-14 DIAGNOSIS — M75101 Unspecified rotator cuff tear or rupture of right shoulder, not specified as traumatic: Secondary | ICD-10-CM

## 2019-03-14 DIAGNOSIS — M19011 Primary osteoarthritis, right shoulder: Secondary | ICD-10-CM

## 2019-03-14 DIAGNOSIS — M12811 Other specific arthropathies, not elsewhere classified, right shoulder: Secondary | ICD-10-CM

## 2019-03-18 ENCOUNTER — Ambulatory Visit
Admission: RE | Admit: 2019-03-18 | Discharge: 2019-03-18 | Disposition: A | Discharge status: Home / Self Care | Payer: PPO | Source: Ambulatory Visit | Attending: Orthopedic Surgery | Admitting: Orthopedic Surgery

## 2019-03-18 ENCOUNTER — Other Ambulatory Visit: Payer: Self-pay

## 2019-03-18 DIAGNOSIS — M62511 Muscle wasting and atrophy, not elsewhere classified, right shoulder: Secondary | ICD-10-CM | POA: Insufficient documentation

## 2019-03-18 DIAGNOSIS — M19011 Primary osteoarthritis, right shoulder: Secondary | ICD-10-CM | POA: Insufficient documentation

## 2019-03-18 DIAGNOSIS — M75121 Complete rotator cuff tear or rupture of right shoulder, not specified as traumatic: Secondary | ICD-10-CM | POA: Diagnosis not present

## 2019-03-18 MED ORDER — LIDOCAINE HCL (PF) 1 % IJ SOLN
10.0000 mL | Freq: Once | INTRAMUSCULAR | Status: AC
  Administered 2019-03-18: 10 mL
  Filled 2019-03-18: qty 10

## 2019-03-18 MED ORDER — SODIUM CHLORIDE (PF) 0.9 % IJ SOLN
10.0000 mL | INTRAMUSCULAR | Status: DC | PRN
  Administered 2019-03-18: 5 mL via INTRAVENOUS

## 2019-03-18 MED ORDER — IOHEXOL 180 MG/ML  SOLN
20.0000 mL | Freq: Once | INTRAMUSCULAR | Status: AC | PRN
  Administered 2019-03-18: 15 mL

## 2019-03-19 DIAGNOSIS — H401112 Primary open-angle glaucoma, right eye, moderate stage: Secondary | ICD-10-CM | POA: Diagnosis not present

## 2019-04-01 DIAGNOSIS — Z20828 Contact with and (suspected) exposure to other viral communicable diseases: Secondary | ICD-10-CM | POA: Diagnosis not present

## 2019-04-01 DIAGNOSIS — Z20822 Contact with and (suspected) exposure to covid-19: Secondary | ICD-10-CM | POA: Diagnosis not present

## 2019-04-17 ENCOUNTER — Other Ambulatory Visit: Payer: Self-pay | Admitting: Orthopedic Surgery

## 2019-04-17 DIAGNOSIS — M75121 Complete rotator cuff tear or rupture of right shoulder, not specified as traumatic: Secondary | ICD-10-CM | POA: Diagnosis not present

## 2019-04-19 DIAGNOSIS — Z95810 Presence of automatic (implantable) cardiac defibrillator: Secondary | ICD-10-CM | POA: Diagnosis not present

## 2019-04-19 DIAGNOSIS — I5022 Chronic systolic (congestive) heart failure: Secondary | ICD-10-CM | POA: Diagnosis not present

## 2019-04-29 ENCOUNTER — Ambulatory Visit
Admission: RE | Admit: 2019-04-29 | Discharge: 2019-04-29 | Disposition: A | Discharge status: Home / Self Care | Payer: PPO | Source: Ambulatory Visit | Attending: Orthopedic Surgery | Admitting: Orthopedic Surgery

## 2019-04-29 ENCOUNTER — Other Ambulatory Visit: Payer: Self-pay

## 2019-04-29 DIAGNOSIS — Z01818 Encounter for other preprocedural examination: Secondary | ICD-10-CM | POA: Diagnosis not present

## 2019-04-29 DIAGNOSIS — M75121 Complete rotator cuff tear or rupture of right shoulder, not specified as traumatic: Secondary | ICD-10-CM | POA: Diagnosis not present

## 2019-04-29 DIAGNOSIS — Z96611 Presence of right artificial shoulder joint: Secondary | ICD-10-CM | POA: Diagnosis not present

## 2019-05-02 ENCOUNTER — Other Ambulatory Visit: Payer: Self-pay | Admitting: Orthopedic Surgery

## 2019-05-08 ENCOUNTER — Other Ambulatory Visit
Admission: RE | Admit: 2019-05-08 | Discharge: 2019-05-08 | Disposition: A | Discharge status: Home / Self Care | Payer: PPO | Source: Ambulatory Visit | Attending: Orthopedic Surgery | Admitting: Orthopedic Surgery

## 2019-05-08 ENCOUNTER — Other Ambulatory Visit: Payer: Self-pay

## 2019-05-08 ENCOUNTER — Encounter
Admission: RE | Admit: 2019-05-08 | Discharge: 2019-05-08 | Disposition: A | Discharge status: Home / Self Care | Payer: PPO | Source: Ambulatory Visit | Attending: Orthopedic Surgery | Admitting: Orthopedic Surgery

## 2019-05-08 DIAGNOSIS — R9431 Abnormal electrocardiogram [ECG] [EKG]: Secondary | ICD-10-CM | POA: Diagnosis not present

## 2019-05-08 DIAGNOSIS — Z01818 Encounter for other preprocedural examination: Secondary | ICD-10-CM | POA: Insufficient documentation

## 2019-05-08 DIAGNOSIS — I2510 Atherosclerotic heart disease of native coronary artery without angina pectoris: Secondary | ICD-10-CM | POA: Diagnosis not present

## 2019-05-08 DIAGNOSIS — I10 Essential (primary) hypertension: Secondary | ICD-10-CM | POA: Insufficient documentation

## 2019-05-08 DIAGNOSIS — Z950 Presence of cardiac pacemaker: Secondary | ICD-10-CM | POA: Diagnosis not present

## 2019-05-08 HISTORY — DX: Deficiency of other specified B group vitamins: E53.8

## 2019-05-08 LAB — CBC WITH DIFFERENTIAL/PLATELET
Abs Immature Granulocytes: 0.04 10*3/uL (ref 0.00–0.07)
Basophils Absolute: 0.1 10*3/uL (ref 0.0–0.1)
Basophils Relative: 1 %
Eosinophils Absolute: 0 10*3/uL (ref 0.0–0.5)
Eosinophils Relative: 1 %
HCT: 39.2 % (ref 39.0–52.0)
Hemoglobin: 13 g/dL (ref 13.0–17.0)
Immature Granulocytes: 1 %
Lymphocytes Relative: 33 %
Lymphs Abs: 2.1 10*3/uL (ref 0.7–4.0)
MCH: 33.8 pg (ref 26.0–34.0)
MCHC: 33.2 g/dL (ref 30.0–36.0)
MCV: 101.8 fL — ABNORMAL HIGH (ref 80.0–100.0)
Monocytes Absolute: 0.7 10*3/uL (ref 0.1–1.0)
Monocytes Relative: 12 %
Neutro Abs: 3.3 10*3/uL (ref 1.7–7.7)
Neutrophils Relative %: 52 %
Platelets: 215 10*3/uL (ref 150–400)
RBC: 3.85 MIL/uL — ABNORMAL LOW (ref 4.22–5.81)
RDW: 14.6 % (ref 11.5–15.5)
WBC: 6.3 10*3/uL (ref 4.0–10.5)
nRBC: 0 % (ref 0.0–0.2)

## 2019-05-08 LAB — PROTIME-INR
INR: 1.7 — ABNORMAL HIGH (ref 0.8–1.2)
Prothrombin Time: 20.2 seconds — ABNORMAL HIGH (ref 11.4–15.2)

## 2019-05-08 LAB — COMPREHENSIVE METABOLIC PANEL
ALT: 20 U/L (ref 0–44)
AST: 24 U/L (ref 15–41)
Albumin: 3.7 g/dL (ref 3.5–5.0)
Alkaline Phosphatase: 63 U/L (ref 38–126)
Anion gap: 8 (ref 5–15)
BUN: 20 mg/dL (ref 8–23)
CO2: 27 mmol/L (ref 22–32)
Calcium: 9.2 mg/dL (ref 8.9–10.3)
Chloride: 106 mmol/L (ref 98–111)
Creatinine, Ser: 1.46 mg/dL — ABNORMAL HIGH (ref 0.61–1.24)
GFR calc Af Amer: 53 mL/min — ABNORMAL LOW (ref >60)
GFR calc non Af Amer: 46 mL/min — ABNORMAL LOW (ref >60)
Glucose, Bld: 115 mg/dL — ABNORMAL HIGH (ref 70–99)
Potassium: 3.8 mmol/L (ref 3.5–5.1)
Sodium: 141 mmol/L (ref 135–145)
Total Bilirubin: 0.7 mg/dL (ref 0.3–1.2)
Total Protein: 7.3 g/dL (ref 6.5–8.1)

## 2019-05-08 LAB — URINALYSIS, ROUTINE W REFLEX MICROSCOPIC
Bilirubin Urine: NEGATIVE
Glucose, UA: NEGATIVE mg/dL
Hgb urine dipstick: NEGATIVE
Ketones, ur: NEGATIVE mg/dL
Leukocytes,Ua: NEGATIVE
Nitrite: NEGATIVE
Protein, ur: NEGATIVE mg/dL
Specific Gravity, Urine: 1.011 (ref 1.005–1.030)
pH: 6 (ref 5.0–8.0)

## 2019-05-08 LAB — APTT: aPTT: 42 seconds — ABNORMAL HIGH (ref 24–36)

## 2019-05-08 LAB — SURGICAL PCR SCREEN
MRSA, PCR: NEGATIVE
Staphylococcus aureus: NEGATIVE

## 2019-05-08 NOTE — Patient Instructions (Addendum)
Your procedure is scheduled on: Monday, February 15 Report to Day Surgery on the 2nd floor of the [facility]. To find out your arrival time, please call [phone] between 1PM - 3PM on: Friday, February 12  REMEMBER: Instructions that are not followed completely may result in serious medical risk, up to and including death; or upon the discretion of your surgeon and anesthesiologist your surgery may need to be rescheduled.  Do not eat food after midnight the night before surgery.  No gum chewing, lozengers or hard candies.  You may however, drink CLEAR liquids up to 2 hours before you are scheduled to arrive for your surgery. Do not drink anything within 2 hours of the start of your surgery.  Clear liquids include: - water  - apple juice without pulp - gatorade - black coffee or tea (Do NOT add milk or creamers to the coffee or tea) Do NOT drink anything that is not on this list.  ENSURE PRE-SURGERY CARBOHYDRATE DRINK:  Complete drinking 3 hours prior to surgery.  No Alcohol for 24 hours before or after surgery.  No Smoking including e-cigarettes for 24 hours prior to surgery.  No chewable tobacco products for at least 6 hours prior to surgery.  No nicotine patches on the day of surgery.  On the morning of surgery brush your teeth with toothpaste and water, you may rinse your mouth with mouthwash if you wish. Do not swallow any toothpaste or mouthwash.  Notify your doctor if there is any change in your medical condition (cold, fever, infection).  Do not wear jewelry, make-up, hairpins, clips or nail polish.  Do not wear lotions, powders, or perfumes.   Do not shave 48 hours prior to surgery.   Contacts and dentures may not be worn into surgery.  Do not bring valuables to the hospital, including drivers license, insurance or credit cards.  [facility] is not responsible for any belongings or valuables.   TAKE THESE MEDICATIONS THE MORNING OF SURGERY:  1.   Amiodarone 2.  Dorzolamide (Trusopt) eye drop 3.  Pantoprazole - (take one the night before and one on the morning of surgery - helps to prevent nausea after surgery.) 4.  Timolol eye drop  Use CHG Soap or wipes as directed on instruction sheet.  Follow recommendations from Cardiologist, Pulmonologist or PCP regarding stopping Xarelto. STOP 3 DAYS PRIOR (LAST DAY TO TAKE IS Thursday, FEB. 11); RESUME AFTER SURGERY FOLLOWING DR. Kayleen Memos.  Stop Anti-inflammatories (NSAIDS) such as Advil, Aleve, Ibuprofen, Motrin, Naproxen, Naprosyn and Aspirin based products such as Excedrin, Goodys Powder, BC Powder. (May take Tylenol or Acetaminophen if needed.)  Stop ANY OVER THE COUNTER supplements until after surgery. (May continue Vitamin B.)  Wear comfortable clothing (specific to your surgery type) to the hospital.  Plan for stool softeners for home use.  If you are being discharged the day of surgery, you will not be allowed to drive home. You will need a responsible adult to drive you home and stay with you that night.   If you are taking public transportation, you will need to have a responsible adult with you. Please confirm with your physician that it is acceptable to use public transportation.   Please call [phone] if you have any questions about these instructions.

## 2019-05-08 NOTE — Pre-Procedure Instructions (Signed)
COPY AND PASTED FROM OP VISIT CARDIOLOGY (CARE EVERYWHERE) 01/30/2019:      ECG (01/31/2019): QRS 106 Msec  Device Interrogation (01/31/2019):  Atrial sensing/P wave: 1.5 mV  Atrial lead impedance: 437 ohms  Atrial threshold: 0.5 V at 0.4 ms  Right Ventricular sensing/R wave: 16.3 mV  Right Ventricular lead impedance: 437 ohms  Right Ventricular threshold: 1.25 V at 0.4 ms  Left Ventricular sensing/R wave: -- mV  Left Ventricular lead impedance: 836 ohms  Left Ventricular threshold: 1.5 V at 0.4 ms  Shock lead impedance: 58 ohms  CXR PA/LAT on (01/31/2019) showed stable device placement and no pneumothorax. Reviewed by attending. Findings and Impression: 1. Left-sided CIED present with a new coronary venous lead present, the remaining support leads are unchanged in location. 2. Some minimal right middle and lower lobe partial atelectasis and no pulmonary edema. No pneumothorax or pleural effusion. 3. Limited lateral radiograph due to patient's overlying arms.   Device Implant (01/30/2019):  Medtronic Biventricular ICD Primary prevention  Summary: 1. Successful CRT-D generator change and new LV lead implantation.

## 2019-05-09 ENCOUNTER — Other Ambulatory Visit: Payer: PPO

## 2019-05-09 LAB — URINE CULTURE: Culture: <10000 — AB

## 2019-05-09 NOTE — Progress Notes (Signed)
Clearance form, pacemaker form and recent EKG that was faxed to Dr. Bethanne Ginger office was returned and placed in the chart.

## 2019-05-13 ENCOUNTER — Encounter: Payer: Self-pay | Admitting: Anesthesiology

## 2019-05-13 ENCOUNTER — Other Ambulatory Visit: Payer: Self-pay

## 2019-05-13 ENCOUNTER — Encounter: Payer: Self-pay | Admitting: Orthopedic Surgery

## 2019-05-13 ENCOUNTER — Encounter
Admission: RE | Disposition: A | Discharge status: Home / Self Care | Payer: Self-pay | Source: Home / Self Care | Attending: Orthopedic Surgery

## 2019-05-13 ENCOUNTER — Ambulatory Visit
Admission: RE | Admit: 2019-05-13 | Discharge: 2019-05-13 | Disposition: A | Discharge status: Home / Self Care | Payer: PPO | Attending: Orthopedic Surgery | Admitting: Orthopedic Surgery

## 2019-05-13 DIAGNOSIS — Z950 Presence of cardiac pacemaker: Secondary | ICD-10-CM | POA: Diagnosis not present

## 2019-05-13 DIAGNOSIS — Z539 Procedure and treatment not carried out, unspecified reason: Secondary | ICD-10-CM | POA: Diagnosis not present

## 2019-05-13 DIAGNOSIS — M7521 Bicipital tendinitis, right shoulder: Secondary | ICD-10-CM | POA: Diagnosis not present

## 2019-05-13 SURGERY — ARTHROPLASTY, SHOULDER, TOTAL, REVERSE
Anesthesia: Choice

## 2019-05-13 MED ORDER — LIDOCAINE HCL (PF) 2 % IJ SOLN
INTRAMUSCULAR | Status: AC
  Filled 2019-05-13: qty 10

## 2019-05-13 MED ORDER — CEFAZOLIN SODIUM-DEXTROSE 2-4 GM/100ML-% IV SOLN
INTRAVENOUS | Status: AC
  Filled 2019-05-13: qty 100

## 2019-05-13 MED ORDER — LACTATED RINGERS IV SOLN: INTRAVENOUS | Status: DC

## 2019-05-13 MED ORDER — NEOMYCIN-POLYMYXIN B GU 40-200000 IR SOLN
Status: AC
  Filled 2019-05-13: qty 20

## 2019-05-13 MED ORDER — ROCURONIUM BROMIDE 50 MG/5ML IV SOLN
INTRAVENOUS | Status: AC
  Filled 2019-05-13: qty 1

## 2019-05-13 MED ORDER — CEFAZOLIN SODIUM-DEXTROSE 2-4 GM/100ML-% IV SOLN: 2.0000 g | INTRAVENOUS | Status: DC

## 2019-05-13 MED ORDER — PROPOFOL 10 MG/ML IV BOLUS
INTRAVENOUS | Status: AC
  Filled 2019-05-13: qty 20

## 2019-05-13 MED ORDER — CHLORHEXIDINE GLUCONATE 4 % EX LIQD
60.0000 mL | Freq: Once | CUTANEOUS | Status: AC
  Administered 2019-05-13: 4 via TOPICAL

## 2019-05-13 MED ORDER — FENTANYL CITRATE (PF) 100 MCG/2ML IJ SOLN
INTRAMUSCULAR | Status: AC
  Filled 2019-05-13: qty 2

## 2019-05-13 MED ORDER — FAMOTIDINE 20 MG PO TABS
ORAL_TABLET | ORAL | Status: AC
  Filled 2019-05-13: qty 1

## 2019-05-13 SURGICAL SUPPLY — 68 items
APL PRP STRL LF DISP 70% ISPRP (MISCELLANEOUS) ×1
BLADE SAGITTAL WIDE XTHICK NO (BLADE) ×2 IMPLANT
CANISTER SUCT 1200ML W/VALVE (MISCELLANEOUS) ×3 IMPLANT
CANISTER SUCT 3000ML PPV (MISCELLANEOUS) ×6 IMPLANT
CHLORAPREP W/TINT 26 (MISCELLANEOUS) ×3 IMPLANT
CNTNR SPEC 2.5X3XGRAD LEK (MISCELLANEOUS) ×1
CONT SPEC 4OZ STER OR WHT (MISCELLANEOUS) ×1
CONT SPEC 4OZ STRL OR WHT (MISCELLANEOUS) ×1
CONTAINER SPEC 2.5X3XGRAD LEK (MISCELLANEOUS) ×2 IMPLANT
COOLER POLAR GLACIER W/PUMP (MISCELLANEOUS) ×3 IMPLANT
COVER BACK TABLE REUSABLE LG (DRAPES) ×2 IMPLANT
COVER WAND RF STERILE (DRAPES) ×2 IMPLANT
DRAPE 3/4 80X56 (DRAPES) ×6 IMPLANT
DRAPE IMP U-DRAPE 54X76 (DRAPES) ×6 IMPLANT
DRAPE INCISE IOBAN 66X45 STRL (DRAPES) ×6 IMPLANT
DRAPE U-SHAPE 47X51 STRL (DRAPES) ×3 IMPLANT
DRSG OPSITE POSTOP 3X4 (GAUZE/BANDAGES/DRESSINGS) ×3 IMPLANT
DRSG OPSITE POSTOP 4X6 (GAUZE/BANDAGES/DRESSINGS) ×3 IMPLANT
DRSG OPSITE POSTOP 4X8 (GAUZE/BANDAGES/DRESSINGS) ×3 IMPLANT
DRSG TEGADERM 2-3/8X2-3/4 SM (GAUZE/BANDAGES/DRESSINGS) ×2 IMPLANT
DRSG TEGADERM 4X10 (GAUZE/BANDAGES/DRESSINGS) ×9 IMPLANT
ELECT BLADE 6.5 EXT (BLADE) IMPLANT
ELECT CAUTERY BLADE 6.4 (BLADE) ×3 IMPLANT
ELECT REM PT RETURN 9FT ADLT (ELECTROSURGICAL) ×2
ELECTRODE REM PT RTRN 9FT ADLT (ELECTROSURGICAL) ×2 IMPLANT
GAUZE 4X4 16PLY RFD (DISPOSABLE) ×3 IMPLANT
GAUZE XEROFORM 1X8 LF (GAUZE/BANDAGES/DRESSINGS) ×3 IMPLANT
GLOVE BIOGEL PI IND STRL 8 (GLOVE) ×4 IMPLANT
GLOVE BIOGEL PI INDICATOR 8 (GLOVE) ×2
GLOVE SURG ORTHO 8.0 STRL STRW (GLOVE) ×4 IMPLANT
GLOVE SURG SYN 8.0 (GLOVE) ×2 IMPLANT
GOWN STRL REUS W/ TWL LRG LVL3 (GOWN DISPOSABLE) ×2 IMPLANT
GOWN STRL REUS W/ TWL XL LVL3 (GOWN DISPOSABLE) ×2 IMPLANT
GOWN STRL REUS W/TWL LRG LVL3 (GOWN DISPOSABLE) ×4
GOWN STRL REUS W/TWL XL LVL3 (GOWN DISPOSABLE) ×2
HEMOVAC 400CC 10FR (MISCELLANEOUS) ×3 IMPLANT
HOOD PEEL AWAY FLYTE STAYCOOL (MISCELLANEOUS) ×9 IMPLANT
KIT STABILIZATION SHOULDER (MISCELLANEOUS) ×3 IMPLANT
MASK FACE SPIDER DISP (MASK) ×2 IMPLANT
MAT ABSORB  FLUID 56X50 GRAY (MISCELLANEOUS) ×2
MAT ABSORB FLUID 56X50 GRAY (MISCELLANEOUS) ×4 IMPLANT
NEEDLE REVERSE CUT 1/2 CRC (NEEDLE) ×2 IMPLANT
NEEDLE SPNL 20GX3.5 QUINCKE YW (NEEDLE) ×2 IMPLANT
NS IRRIG 1000ML POUR BTL (IV SOLUTION) ×3 IMPLANT
PACK ARTHROSCOPY SHOULDER (MISCELLANEOUS) ×2 IMPLANT
PAD WRAPON POLAR SHDR XLG (MISCELLANEOUS) ×1 IMPLANT
PASSER SUT SWANSON 36MM LOOP (INSTRUMENTS) IMPLANT
PENCIL SMOKE ULTRAEVAC 22 CON (MISCELLANEOUS) ×2 IMPLANT
PULSAVAC PLUS IRRIG FAN TIP (DISPOSABLE) ×2
RETRIEVER SUT HEWSON (MISCELLANEOUS) IMPLANT
SLING ULTRA II LG (MISCELLANEOUS) ×2 IMPLANT
SLING ULTRA II M (MISCELLANEOUS) ×2 IMPLANT
SPONGE LAP 18X18 RF (DISPOSABLE) ×12 IMPLANT
STAPLER SKIN PROX 35W (STAPLE) IMPLANT
STRAP SAFETY 5IN WIDE (MISCELLANEOUS) ×6 IMPLANT
STRIP CLOSURE SKIN 1/2X4 (GAUZE/BANDAGES/DRESSINGS) ×2 IMPLANT
SUT FIBERWIRE #2 38 BLUE 1/2 (SUTURE) ×8
SUT MNCRL AB 4-0 PS2 18 (SUTURE) IMPLANT
SUT PROLENE 6 0 P 1 18 (SUTURE) ×3 IMPLANT
SUT TICRON 2-0 30IN 311381 (SUTURE) ×6 IMPLANT
SUT VIC AB 0 CT1 36 (SUTURE) ×2 IMPLANT
SUT VIC AB 2-0 CT2 27 (SUTURE) ×6 IMPLANT
SUTURE FIBERWR #2 38 BLUE 1/2 (SUTURE) ×8 IMPLANT
SYR 20ML LL LF (SYRINGE) ×3 IMPLANT
SYR 30ML LL (SYRINGE) ×6 IMPLANT
TIP FAN IRRIG PULSAVAC PLUS (DISPOSABLE) ×2 IMPLANT
TRAY FOLEY SLVR 16FR LF STAT (SET/KITS/TRAYS/PACK) IMPLANT
WRAPON POLAR PAD SHDR XLG (MISCELLANEOUS) ×2

## 2019-05-13 NOTE — H&P (Signed)
Paper H&P to be scanned into permanent record. H&P reviewed. No significant changes noted.  

## 2019-05-13 NOTE — Progress Notes (Signed)
Per Anesthesia- patient is pacemaker and ICD dependent. The patient is unable to proceed to surgery until Pacemaker rep here, rep unable to come until later today. Dr. Posey Pronto and Rosey Bath in to speak with the patient to cancel the case today. Pt understands he will follow up with the office to reschedule.

## 2019-05-16 ENCOUNTER — Other Ambulatory Visit: Admission: RE | Admit: 2019-05-16 | Payer: PPO | Source: Ambulatory Visit

## 2019-05-17 ENCOUNTER — Other Ambulatory Visit: Admission: RE | Admit: 2019-05-17 | Payer: PPO | Source: Ambulatory Visit

## 2019-05-17 NOTE — Progress Notes (Signed)
I spoke with Dr. Marcello Moores with electrophysiology on the phone regarding this patient.  Per Dr. Marcello Moores the pt is BiV paced for long term optimization but is not reliant on pacing based on his underlying rhythm.  Dr. Marcello Moores specifically stated it would not be problematic if he were not BiV paced during the procedure due to electromagnetic interference, as his BiV pacing is "long term" therapy.  He stated that placing a magnet will be sufficient to deactivate the tachyarrhythmia sensing on the device, and that reprogramming of the device to asynchronous mode is not required prior to surgery.    Tera Mater, MD Anesthesiology

## 2019-05-20 ENCOUNTER — Encounter
Admission: RE | Disposition: A | Discharge status: Home / Self Care | Payer: Self-pay | Source: Home / Self Care | Attending: Orthopedic Surgery

## 2019-05-20 ENCOUNTER — Other Ambulatory Visit: Payer: Self-pay

## 2019-05-20 ENCOUNTER — Encounter: Payer: Self-pay | Admitting: Orthopedic Surgery

## 2019-05-20 ENCOUNTER — Inpatient Hospital Stay: Payer: PPO | Admitting: Anesthesiology

## 2019-05-20 ENCOUNTER — Ambulatory Visit: Payer: Self-pay

## 2019-05-20 ENCOUNTER — Inpatient Hospital Stay: Payer: PPO

## 2019-05-20 ENCOUNTER — Inpatient Hospital Stay
Admission: RE | Admit: 2019-05-20 | Discharge: 2019-05-21 | DRG: 483 | Disposition: A | Discharge status: Home / Self Care | Payer: PPO | Attending: Orthopedic Surgery | Admitting: Orthopedic Surgery

## 2019-05-20 DIAGNOSIS — R040 Epistaxis: Secondary | ICD-10-CM | POA: Diagnosis not present

## 2019-05-20 DIAGNOSIS — I2510 Atherosclerotic heart disease of native coronary artery without angina pectoris: Secondary | ICD-10-CM | POA: Diagnosis present

## 2019-05-20 DIAGNOSIS — F329 Major depressive disorder, single episode, unspecified: Secondary | ICD-10-CM | POA: Diagnosis present

## 2019-05-20 DIAGNOSIS — M65811 Other synovitis and tenosynovitis, right shoulder: Secondary | ICD-10-CM | POA: Diagnosis present

## 2019-05-20 DIAGNOSIS — E785 Hyperlipidemia, unspecified: Secondary | ICD-10-CM | POA: Diagnosis not present

## 2019-05-20 DIAGNOSIS — Z20822 Contact with and (suspected) exposure to covid-19: Secondary | ICD-10-CM | POA: Diagnosis not present

## 2019-05-20 DIAGNOSIS — I480 Paroxysmal atrial fibrillation: Secondary | ICD-10-CM | POA: Diagnosis not present

## 2019-05-20 DIAGNOSIS — Z79899 Other long term (current) drug therapy: Secondary | ICD-10-CM

## 2019-05-20 DIAGNOSIS — Z950 Presence of cardiac pacemaker: Secondary | ICD-10-CM

## 2019-05-20 DIAGNOSIS — E039 Hypothyroidism, unspecified: Secondary | ICD-10-CM | POA: Diagnosis present

## 2019-05-20 DIAGNOSIS — H409 Unspecified glaucoma: Secondary | ICD-10-CM | POA: Diagnosis present

## 2019-05-20 DIAGNOSIS — I739 Peripheral vascular disease, unspecified: Secondary | ICD-10-CM | POA: Diagnosis present

## 2019-05-20 DIAGNOSIS — M25511 Pain in right shoulder: Secondary | ICD-10-CM | POA: Diagnosis not present

## 2019-05-20 DIAGNOSIS — G8918 Other acute postprocedural pain: Secondary | ICD-10-CM | POA: Diagnosis not present

## 2019-05-20 DIAGNOSIS — N183 Chronic kidney disease, stage 3 unspecified: Secondary | ICD-10-CM | POA: Diagnosis not present

## 2019-05-20 DIAGNOSIS — Z86711 Personal history of pulmonary embolism: Secondary | ICD-10-CM | POA: Diagnosis not present

## 2019-05-20 DIAGNOSIS — Z96619 Presence of unspecified artificial shoulder joint: Secondary | ICD-10-CM

## 2019-05-20 DIAGNOSIS — G8929 Other chronic pain: Secondary | ICD-10-CM | POA: Diagnosis present

## 2019-05-20 DIAGNOSIS — Z8673 Personal history of transient ischemic attack (TIA), and cerebral infarction without residual deficits: Secondary | ICD-10-CM

## 2019-05-20 DIAGNOSIS — M7521 Bicipital tendinitis, right shoulder: Secondary | ICD-10-CM | POA: Diagnosis not present

## 2019-05-20 DIAGNOSIS — S0990XS Unspecified injury of head, sequela: Secondary | ICD-10-CM

## 2019-05-20 DIAGNOSIS — Z419 Encounter for procedure for purposes other than remedying health state, unspecified: Secondary | ICD-10-CM

## 2019-05-20 DIAGNOSIS — I130 Hypertensive heart and chronic kidney disease with heart failure and stage 1 through stage 4 chronic kidney disease, or unspecified chronic kidney disease: Secondary | ICD-10-CM | POA: Diagnosis present

## 2019-05-20 DIAGNOSIS — I252 Old myocardial infarction: Secondary | ICD-10-CM | POA: Diagnosis not present

## 2019-05-20 DIAGNOSIS — I5022 Chronic systolic (congestive) heart failure: Secondary | ICD-10-CM | POA: Diagnosis present

## 2019-05-20 DIAGNOSIS — E538 Deficiency of other specified B group vitamins: Secondary | ICD-10-CM | POA: Diagnosis not present

## 2019-05-20 DIAGNOSIS — M75101 Unspecified rotator cuff tear or rupture of right shoulder, not specified as traumatic: Principal | ICD-10-CM | POA: Diagnosis present

## 2019-05-20 DIAGNOSIS — Z471 Aftercare following joint replacement surgery: Secondary | ICD-10-CM | POA: Diagnosis not present

## 2019-05-20 DIAGNOSIS — M75121 Complete rotator cuff tear or rupture of right shoulder, not specified as traumatic: Secondary | ICD-10-CM | POA: Diagnosis not present

## 2019-05-20 DIAGNOSIS — K219 Gastro-esophageal reflux disease without esophagitis: Secondary | ICD-10-CM | POA: Diagnosis not present

## 2019-05-20 DIAGNOSIS — R569 Unspecified convulsions: Secondary | ICD-10-CM | POA: Diagnosis not present

## 2019-05-20 DIAGNOSIS — Z9861 Coronary angioplasty status: Secondary | ICD-10-CM | POA: Diagnosis not present

## 2019-05-20 DIAGNOSIS — Z96611 Presence of right artificial shoulder joint: Secondary | ICD-10-CM | POA: Diagnosis not present

## 2019-05-20 HISTORY — PX: REVERSE SHOULDER ARTHROPLASTY: SHX5054

## 2019-05-20 SURGERY — ARTHROPLASTY, SHOULDER, TOTAL, REVERSE
Anesthesia: General | Site: Shoulder | Laterality: Right

## 2019-05-20 MED ORDER — LATANOPROST 0.005 % OP SOLN
1.0000 [drp] | Freq: Every day | OPHTHALMIC | Status: DC
  Administered 2019-05-20: 1 [drp] via OPHTHALMIC
  Filled 2019-05-20: qty 2.5

## 2019-05-20 MED ORDER — HYDROMORPHONE HCL 1 MG/ML IJ SOLN: 0.2000 mg | INTRAMUSCULAR | Status: DC | PRN

## 2019-05-20 MED ORDER — DEXAMETHASONE SODIUM PHOSPHATE 10 MG/ML IJ SOLN
INTRAMUSCULAR | Status: DC | PRN
  Administered 2019-05-20: 10 mg via INTRAVENOUS

## 2019-05-20 MED ORDER — TRANEXAMIC ACID 1000 MG/10ML IV SOLN
INTRAVENOUS | Status: DC | PRN
  Administered 2019-05-20: 08:00:00 1000 mg via INTRAVENOUS

## 2019-05-20 MED ORDER — ASPIRIN EC 325 MG PO TBEC
325.0000 mg | DELAYED_RELEASE_TABLET | Freq: Every day | ORAL | Status: DC
  Administered 2019-05-21: 09:00:00 325 mg via ORAL
  Filled 2019-05-20: qty 1

## 2019-05-20 MED ORDER — MIDAZOLAM HCL 2 MG/2ML IJ SOLN
INTRAMUSCULAR | Status: DC | PRN
  Administered 2019-05-20: 1.5 mg via INTRAVENOUS

## 2019-05-20 MED ORDER — DORZOLAMIDE HCL 2 % OP SOLN
1.0000 [drp] | Freq: Two times a day (BID) | OPHTHALMIC | Status: DC
  Administered 2019-05-20 – 2019-05-21 (×2): 1 [drp] via OPHTHALMIC
  Filled 2019-05-20: qty 10

## 2019-05-20 MED ORDER — FENTANYL CITRATE (PF) 100 MCG/2ML IJ SOLN
INTRAMUSCULAR | Status: AC
  Filled 2019-05-20: qty 2

## 2019-05-20 MED ORDER — ALUM & MAG HYDROXIDE-SIMETH 200-200-20 MG/5ML PO SUSP: 30.0000 mL | ORAL | Status: DC | PRN

## 2019-05-20 MED ORDER — SEVOFLURANE IN SOLN
RESPIRATORY_TRACT | Status: AC
  Filled 2019-05-20: qty 250

## 2019-05-20 MED ORDER — BUPIVACAINE HCL (PF) 0.5 % IJ SOLN
INTRAMUSCULAR | Status: DC | PRN
  Administered 2019-05-20: 10 mL via PERINEURAL

## 2019-05-20 MED ORDER — EPINEPHRINE PF 1 MG/ML IJ SOLN
INTRAMUSCULAR | Status: AC
  Filled 2019-05-20: qty 1

## 2019-05-20 MED ORDER — LOSARTAN POTASSIUM 50 MG PO TABS
50.0000 mg | ORAL_TABLET | Freq: Every day | ORAL | Status: DC
  Administered 2019-05-21: 09:00:00 50 mg via ORAL
  Filled 2019-05-20: qty 1

## 2019-05-20 MED ORDER — CEFAZOLIN SODIUM-DEXTROSE 2-4 GM/100ML-% IV SOLN
INTRAVENOUS | Status: AC
  Filled 2019-05-20: qty 100

## 2019-05-20 MED ORDER — SODIUM CHLORIDE 0.9 % IV SOLN
INTRAVENOUS | Status: DC | PRN
  Administered 2019-05-20: 35 ug/min via INTRAVENOUS

## 2019-05-20 MED ORDER — VANCOMYCIN HCL 1000 MG IV SOLR
INTRAVENOUS | Status: DC | PRN
  Administered 2019-05-20: 1000 mg

## 2019-05-20 MED ORDER — ACETAMINOPHEN 500 MG PO TABS
1000.0000 mg | ORAL_TABLET | Freq: Three times a day (TID) | ORAL | Status: DC
  Administered 2019-05-20 – 2019-05-21 (×3): 1000 mg via ORAL
  Filled 2019-05-20 (×4): qty 2

## 2019-05-20 MED ORDER — PHENOL 1.4 % MT LIQD
1.0000 | OROMUCOSAL | Status: DC | PRN
  Filled 2019-05-20: qty 177

## 2019-05-20 MED ORDER — METOCLOPRAMIDE HCL 10 MG PO TABS: 5.0000 mg | ORAL_TABLET | Freq: Three times a day (TID) | ORAL | Status: DC | PRN

## 2019-05-20 MED ORDER — SUGAMMADEX SODIUM 500 MG/5ML IV SOLN
INTRAVENOUS | Status: DC | PRN
  Administered 2019-05-20: 100 mg via INTRAVENOUS

## 2019-05-20 MED ORDER — ACETAMINOPHEN 325 MG PO TABS: 650.0000 mg | ORAL_TABLET | ORAL | Status: DC | PRN

## 2019-05-20 MED ORDER — MIDAZOLAM HCL 2 MG/2ML IJ SOLN
INTRAMUSCULAR | Status: AC
  Administered 2019-05-20: 1 mg via INTRAVENOUS
  Filled 2019-05-20: qty 2

## 2019-05-20 MED ORDER — BUPIVACAINE LIPOSOME 1.3 % IJ SUSP
INTRAMUSCULAR | Status: DC | PRN
  Administered 2019-05-20: 20 mL via PERINEURAL

## 2019-05-20 MED ORDER — NEOMYCIN-POLYMYXIN B GU 40-200000 IR SOLN
Status: AC
  Filled 2019-05-20: qty 20

## 2019-05-20 MED ORDER — PANTOPRAZOLE SODIUM 20 MG PO TBEC
20.0000 mg | DELAYED_RELEASE_TABLET | Freq: Every day | ORAL | Status: DC
  Administered 2019-05-21: 09:00:00 20 mg via ORAL
  Filled 2019-05-20: qty 1

## 2019-05-20 MED ORDER — PROPOFOL 10 MG/ML IV BOLUS
INTRAVENOUS | Status: DC | PRN
  Administered 2019-05-20: 150 mg via INTRAVENOUS

## 2019-05-20 MED ORDER — CEFAZOLIN SODIUM-DEXTROSE 2-4 GM/100ML-% IV SOLN
2.0000 g | Freq: Four times a day (QID) | INTRAVENOUS | Status: AC
  Administered 2019-05-20 – 2019-05-21 (×3): 2 g via INTRAVENOUS
  Filled 2019-05-20 (×4): qty 100

## 2019-05-20 MED ORDER — METHOCARBAMOL 1000 MG/10ML IJ SOLN
500.0000 mg | Freq: Four times a day (QID) | INTRAVENOUS | Status: DC | PRN
  Filled 2019-05-20: qty 5

## 2019-05-20 MED ORDER — TIMOLOL MALEATE 0.5 % OP SOLN
1.0000 [drp] | Freq: Two times a day (BID) | OPHTHALMIC | Status: DC
  Administered 2019-05-20 – 2019-05-21 (×2): 1 [drp] via OPHTHALMIC
  Filled 2019-05-20: qty 5

## 2019-05-20 MED ORDER — DOCUSATE SODIUM 100 MG PO CAPS
100.0000 mg | ORAL_CAPSULE | Freq: Two times a day (BID) | ORAL | Status: DC
  Administered 2019-05-20 – 2019-05-21 (×2): 100 mg via ORAL
  Filled 2019-05-20 (×2): qty 1

## 2019-05-20 MED ORDER — ONDANSETRON HCL 4 MG/2ML IJ SOLN
INTRAMUSCULAR | Status: AC
  Filled 2019-05-20: qty 2

## 2019-05-20 MED ORDER — FENTANYL CITRATE (PF) 100 MCG/2ML IJ SOLN: 25.0000 ug | INTRAMUSCULAR | Status: DC | PRN

## 2019-05-20 MED ORDER — OXYCODONE HCL 5 MG PO TABS: 5.0000 mg | ORAL_TABLET | ORAL | Status: DC | PRN

## 2019-05-20 MED ORDER — BUPIVACAINE LIPOSOME 1.3 % IJ SUSP
INTRAMUSCULAR | Status: AC
  Filled 2019-05-20: qty 20

## 2019-05-20 MED ORDER — PROPOFOL 10 MG/ML IV BOLUS
INTRAVENOUS | Status: AC
  Filled 2019-05-20: qty 40

## 2019-05-20 MED ORDER — TRANEXAMIC ACID 1000 MG/10ML IV SOLN
INTRAVENOUS | Status: AC
  Filled 2019-05-20: qty 10

## 2019-05-20 MED ORDER — FENTANYL CITRATE (PF) 100 MCG/2ML IJ SOLN: 50.0000 ug | Freq: Once | INTRAMUSCULAR | Status: AC

## 2019-05-20 MED ORDER — MIDAZOLAM HCL 2 MG/2ML IJ SOLN
1.0000 mg | Freq: Once | INTRAMUSCULAR | Status: AC
  Administered 2019-05-20: 0.5 mg via INTRAVENOUS

## 2019-05-20 MED ORDER — ONDANSETRON HCL 4 MG/2ML IJ SOLN
INTRAMUSCULAR | Status: AC
  Administered 2019-05-20: 4 mg via INTRAVENOUS
  Filled 2019-05-20: qty 2

## 2019-05-20 MED ORDER — ONDANSETRON HCL 4 MG/2ML IJ SOLN: 4.0000 mg | Freq: Once | INTRAMUSCULAR | Status: AC | PRN

## 2019-05-20 MED ORDER — ONDANSETRON HCL 4 MG PO TABS: 4.0000 mg | ORAL_TABLET | Freq: Four times a day (QID) | ORAL | Status: DC | PRN

## 2019-05-20 MED ORDER — VANCOMYCIN HCL 1000 MG IV SOLR
INTRAVENOUS | Status: AC
  Filled 2019-05-20: qty 1000

## 2019-05-20 MED ORDER — CEFAZOLIN SODIUM-DEXTROSE 2-3 GM-%(50ML) IV SOLR
INTRAVENOUS | Status: DC | PRN
  Administered 2019-05-20: 2 g via INTRAVENOUS

## 2019-05-20 MED ORDER — METHOCARBAMOL 500 MG PO TABS: 500.0000 mg | ORAL_TABLET | Freq: Four times a day (QID) | ORAL | Status: DC | PRN

## 2019-05-20 MED ORDER — MENTHOL 3 MG MT LOZG
1.0000 | LOZENGE | OROMUCOSAL | Status: DC | PRN
  Filled 2019-05-20: qty 9

## 2019-05-20 MED ORDER — ATORVASTATIN CALCIUM 20 MG PO TABS
20.0000 mg | ORAL_TABLET | Freq: Every day | ORAL | Status: DC
  Administered 2019-05-20: 21:00:00 20 mg via ORAL
  Filled 2019-05-20: qty 1

## 2019-05-20 MED ORDER — LIDOCAINE HCL (PF) 2 % IJ SOLN
INTRAMUSCULAR | Status: AC
  Filled 2019-05-20: qty 5

## 2019-05-20 MED ORDER — RIVAROXABAN 20 MG PO TABS
20.0000 mg | ORAL_TABLET | Freq: Every day | ORAL | Status: DC
  Filled 2019-05-20: qty 1

## 2019-05-20 MED ORDER — LACTATED RINGERS IV SOLN: INTRAVENOUS | Status: DC

## 2019-05-20 MED ORDER — BUPIVACAINE HCL (PF) 0.25 % IJ SOLN
INTRAMUSCULAR | Status: AC
  Filled 2019-05-20: qty 30

## 2019-05-20 MED ORDER — SENNOSIDES-DOCUSATE SODIUM 8.6-50 MG PO TABS: 1.0000 | ORAL_TABLET | Freq: Every evening | ORAL | Status: DC | PRN

## 2019-05-20 MED ORDER — LIDOCAINE HCL (CARDIAC) PF 100 MG/5ML IV SOSY
PREFILLED_SYRINGE | INTRAVENOUS | Status: DC | PRN
  Administered 2019-05-20: 60 mg via INTRAVENOUS

## 2019-05-20 MED ORDER — BUPIVACAINE HCL (PF) 0.5 % IJ SOLN
INTRAMUSCULAR | Status: AC
  Filled 2019-05-20: qty 10

## 2019-05-20 MED ORDER — TRANEXAMIC ACID-NACL 1000-0.7 MG/100ML-% IV SOLN: 1000.0000 mg | Freq: Once | INTRAVENOUS | Status: AC

## 2019-05-20 MED ORDER — ROCURONIUM BROMIDE 100 MG/10ML IV SOLN
INTRAVENOUS | Status: DC | PRN
  Administered 2019-05-20: 25 mg via INTRAVENOUS

## 2019-05-20 MED ORDER — VITAMIN B-12 1000 MCG PO TABS
1000.0000 ug | ORAL_TABLET | Freq: Two times a day (BID) | ORAL | Status: DC
  Administered 2019-05-20 – 2019-05-21 (×2): 1000 ug via ORAL
  Filled 2019-05-20 (×2): qty 1

## 2019-05-20 MED ORDER — ROCURONIUM BROMIDE 50 MG/5ML IV SOLN
INTRAVENOUS | Status: AC
  Filled 2019-05-20: qty 1

## 2019-05-20 MED ORDER — FENTANYL CITRATE (PF) 100 MCG/2ML IJ SOLN
INTRAMUSCULAR | Status: DC | PRN
  Administered 2019-05-20 (×4): 25 ug via INTRAVENOUS

## 2019-05-20 MED ORDER — TRANEXAMIC ACID-NACL 1000-0.7 MG/100ML-% IV SOLN
INTRAVENOUS | Status: AC
  Administered 2019-05-20: 11:00:00 1000 mg via INTRAVENOUS
  Filled 2019-05-20: qty 100

## 2019-05-20 MED ORDER — OXYCODONE HCL 5 MG PO TABS: 10.0000 mg | ORAL_TABLET | ORAL | Status: DC | PRN

## 2019-05-20 MED ORDER — SUGAMMADEX SODIUM 200 MG/2ML IV SOLN
INTRAVENOUS | Status: AC
  Filled 2019-05-20: qty 2

## 2019-05-20 MED ORDER — CEFAZOLIN SODIUM-DEXTROSE 2-4 GM/100ML-% IV SOLN: 2.0000 g | Freq: Once | INTRAVENOUS | Status: DC

## 2019-05-20 MED ORDER — EPHEDRINE SULFATE 50 MG/ML IJ SOLN
INTRAMUSCULAR | Status: DC | PRN
  Administered 2019-05-20: 5 mg via INTRAVENOUS
  Administered 2019-05-20: 10 mg via INTRAVENOUS
  Administered 2019-05-20: 5 mg via INTRAVENOUS

## 2019-05-20 MED ORDER — SODIUM CHLORIDE 0.9 % IV SOLN: INTRAVENOUS | Status: DC

## 2019-05-20 MED ORDER — BISACODYL 10 MG RE SUPP: 10.0000 mg | Freq: Every day | RECTAL | Status: DC | PRN

## 2019-05-20 MED ORDER — AMIODARONE HCL 200 MG PO TABS
100.0000 mg | ORAL_TABLET | ORAL | Status: DC
  Administered 2019-05-21: 09:00:00 100 mg via ORAL
  Filled 2019-05-20: qty 1

## 2019-05-20 MED ORDER — SIMVASTATIN 20 MG PO TABS: 40.0000 mg | ORAL_TABLET | Freq: Every day | ORAL | Status: DC

## 2019-05-20 MED ORDER — METOCLOPRAMIDE HCL 5 MG/ML IJ SOLN: 5.0000 mg | Freq: Three times a day (TID) | INTRAMUSCULAR | Status: DC | PRN

## 2019-05-20 MED ORDER — LIDOCAINE HCL (PF) 1 % IJ SOLN
INTRAMUSCULAR | Status: AC
  Filled 2019-05-20: qty 5

## 2019-05-20 MED ORDER — NEOMYCIN-POLYMYXIN B GU 40-200000 IR SOLN
Status: DC | PRN
  Administered 2019-05-20: 4 mL

## 2019-05-20 MED ORDER — ONDANSETRON HCL 4 MG/2ML IJ SOLN: 4.0000 mg | Freq: Four times a day (QID) | INTRAMUSCULAR | Status: DC | PRN

## 2019-05-20 MED ORDER — ONDANSETRON HCL 4 MG/2ML IJ SOLN
INTRAMUSCULAR | Status: DC | PRN
  Administered 2019-05-20: 4 mg via INTRAVENOUS

## 2019-05-20 MED ORDER — MIDAZOLAM HCL 2 MG/2ML IJ SOLN
INTRAMUSCULAR | Status: AC
  Filled 2019-05-20: qty 2

## 2019-05-20 MED ORDER — FENTANYL CITRATE (PF) 100 MCG/2ML IJ SOLN
INTRAMUSCULAR | Status: AC
  Administered 2019-05-20: 25 ug via INTRAVENOUS
  Filled 2019-05-20: qty 2

## 2019-05-20 SURGICAL SUPPLY — 90 items
ADH SKN CLS APL DERMABOND .7 (GAUZE/BANDAGES/DRESSINGS) ×1
APL PRP STRL LF DISP 70% ISPRP (MISCELLANEOUS) ×1
BASEPLATE P2 COATD GLND 6.5X30 (Shoulder) IMPLANT
BIT DRILL 4 DIA CALIBRATED (BIT) ×1 IMPLANT
BIT DRILL GUIDE PATIENT MATCH (MISCELLANEOUS) ×1 IMPLANT
BLADE SAGITTAL WIDE XTHICK NO (BLADE) ×2 IMPLANT
BSPLAT GLND 30 STRL LF SHLDR (Shoulder) ×1 IMPLANT
CANISTER SUCT 1200ML W/VALVE (MISCELLANEOUS) ×2 IMPLANT
CANISTER SUCT 3000ML PPV (MISCELLANEOUS) ×4 IMPLANT
CHLORAPREP W/TINT 26 (MISCELLANEOUS) ×2 IMPLANT
CNTNR SPEC 2.5X3XGRAD LEK (MISCELLANEOUS) ×1
CONT SPEC 4OZ STER OR WHT (MISCELLANEOUS) ×1
CONT SPEC 4OZ STRL OR WHT (MISCELLANEOUS) ×1
CONTAINER SPEC 2.5X3XGRAD LEK (MISCELLANEOUS) ×1 IMPLANT
COOLER POLAR GLACIER W/PUMP (MISCELLANEOUS) ×2 IMPLANT
COVER BACK TABLE REUSABLE LG (DRAPES) ×2 IMPLANT
COVER WAND RF STERILE (DRAPES) ×2 IMPLANT
DERMABOND ADVANCED (GAUZE/BANDAGES/DRESSINGS) ×1
DERMABOND ADVANCED .7 DNX12 (GAUZE/BANDAGES/DRESSINGS) ×1 IMPLANT
DRAPE 3/4 80X56 (DRAPES) ×4 IMPLANT
DRAPE IMP U-DRAPE 54X76 (DRAPES) ×4 IMPLANT
DRAPE INCISE IOBAN 66X45 STRL (DRAPES) ×4 IMPLANT
DRAPE U-SHAPE 47X51 STRL (DRAPES) ×2 IMPLANT
DRILL GUIDE PATIENT MATCH (MISCELLANEOUS) ×2
DRSG MEPILEX SACRM 8.7X9.8 (GAUZE/BANDAGES/DRESSINGS) ×2 IMPLANT
DRSG OPSITE POSTOP 3X4 (GAUZE/BANDAGES/DRESSINGS) ×2 IMPLANT
DRSG OPSITE POSTOP 4X6 (GAUZE/BANDAGES/DRESSINGS) ×2 IMPLANT
DRSG OPSITE POSTOP 4X8 (GAUZE/BANDAGES/DRESSINGS) ×2 IMPLANT
DRSG TEGADERM 2-3/8X2-3/4 SM (GAUZE/BANDAGES/DRESSINGS) ×3 IMPLANT
DRSG TEGADERM 4X10 (GAUZE/BANDAGES/DRESSINGS) ×6 IMPLANT
DRSG TEGADERM 6X8 (GAUZE/BANDAGES/DRESSINGS) ×1 IMPLANT
ELECT REM PT RETURN 9FT ADLT (ELECTROSURGICAL) ×2
ELECTRODE REM PT RTRN 9FT ADLT (ELECTROSURGICAL) ×1 IMPLANT
GAUZE 4X4 16PLY RFD (DISPOSABLE) ×2 IMPLANT
GAUZE XEROFORM 1X8 LF (GAUZE/BANDAGES/DRESSINGS) ×2 IMPLANT
GLOVE BIOGEL PI IND STRL 8 (GLOVE) ×2 IMPLANT
GLOVE BIOGEL PI INDICATOR 8 (GLOVE) ×2
GLOVE SURG ORTHO 8.0 STRL STRW (GLOVE) ×6 IMPLANT
GLOVE SURG SYN 8.0 (GLOVE) ×2 IMPLANT
GOWN STRL REUS W/ TWL LRG LVL3 (GOWN DISPOSABLE) ×3 IMPLANT
GOWN STRL REUS W/ TWL XL LVL3 (GOWN DISPOSABLE) ×1 IMPLANT
GOWN STRL REUS W/TWL LRG LVL3 (GOWN DISPOSABLE) ×6
GOWN STRL REUS W/TWL XL LVL3 (GOWN DISPOSABLE) ×2
HEMOVAC 400CC 10FR (MISCELLANEOUS) ×2 IMPLANT
HOOD PEEL AWAY FLYTE STAYCOOL (MISCELLANEOUS) ×8 IMPLANT
ILLUMINATOR WAVEGUIDE N/F (MISCELLANEOUS) ×1 IMPLANT
INSERT EPOLY STND HUMERUS 32MM (Shoulder) ×2 IMPLANT
INSERT EPOLYSTD HUMERUS 32MM (Shoulder) IMPLANT
KIT STABILIZATION SHOULDER (MISCELLANEOUS) ×2 IMPLANT
MASK FACE SPIDER DISP (MASK) ×2 IMPLANT
MAT ABSORB  FLUID 56X50 GRAY (MISCELLANEOUS) ×2
MAT ABSORB FLUID 56X50 GRAY (MISCELLANEOUS) ×2 IMPLANT
NEEDLE REVERSE CUT 1/2 CRC (NEEDLE) ×2 IMPLANT
NEEDLE SPNL 20GX3.5 QUINCKE YW (NEEDLE) ×2 IMPLANT
NS IRRIG 1000ML POUR BTL (IV SOLUTION) ×2 IMPLANT
P2 COATDE GLNOID BSEPLT 6.5X30 (Shoulder) ×2 IMPLANT
PACK ARTHROSCOPY SHOULDER (MISCELLANEOUS) ×2 IMPLANT
PAD WRAPON POLAR SHDR XLG (MISCELLANEOUS) ×1 IMPLANT
PASSER SUT SWANSON 36MM LOOP (INSTRUMENTS) IMPLANT
PENCIL SMOKE ULTRAEVAC 22 CON (MISCELLANEOUS) ×2 IMPLANT
PULSAVAC PLUS IRRIG FAN TIP (DISPOSABLE) ×2
RETRIEVER SUT HEWSON (MISCELLANEOUS) IMPLANT
SCREW BONE LOCKING RSP 5.0X14 (Screw) ×2 IMPLANT
SCREW BONE LOCKING RSP 5.0X30 (Screw) ×4 IMPLANT
SCREW BONE RSP LOCK 5X14 (Screw) IMPLANT
SCREW BONE RSP LOCK 5X22 (Screw) IMPLANT
SCREW BONE RSP LOCK 5X30 (Screw) IMPLANT
SCREW BONE RSP LOCKING 5.0X32 (Screw) ×2 IMPLANT
SCREW RETAIN W/HEAD 32MM (Shoulder) ×1 IMPLANT
SLING ULTRA II LG (MISCELLANEOUS) ×2 IMPLANT
SLING ULTRA II M (MISCELLANEOUS) ×1 IMPLANT
SPONGE DRAIN TRACH 4X4 STRL 2S (GAUZE/BANDAGES/DRESSINGS) ×1 IMPLANT
SPONGE LAP 18X18 RF (DISPOSABLE) ×8 IMPLANT
STAPLER SKIN PROX 35W (STAPLE) IMPLANT
STEM HUMERAL 12X48 STD SHORT (Shoulder) ×1 IMPLANT
STRAP SAFETY 5IN WIDE (MISCELLANEOUS) ×4 IMPLANT
STRIP CLOSURE SKIN 1/2X4 (GAUZE/BANDAGES/DRESSINGS) ×1 IMPLANT
SUT FIBERWIRE #2 38 BLUE 1/2 (SUTURE) ×2
SUT MNCRL AB 4-0 PS2 18 (SUTURE) IMPLANT
SUT PROLENE 6 0 P 1 18 (SUTURE) IMPLANT
SUT TICRON 2-0 30IN 311381 (SUTURE) ×6 IMPLANT
SUT VIC AB 0 CT1 36 (SUTURE) ×2 IMPLANT
SUT VIC AB 2-0 CT2 27 (SUTURE) ×4 IMPLANT
SUTURE FIBERWR #2 38 BLUE 1/2 (SUTURE) ×1 IMPLANT
SYR 20ML LL LF (SYRINGE) ×2 IMPLANT
SYR 30ML LL (SYRINGE) ×4 IMPLANT
TIP FAN IRRIG PULSAVAC PLUS (DISPOSABLE) ×1 IMPLANT
TRAY FOLEY SLVR 16FR LF STAT (SET/KITS/TRAYS/PACK) IMPLANT
WIRE GUIDE 2.4 PATIENT MATCH (MISCELLANEOUS) ×1 IMPLANT
WRAPON POLAR PAD SHDR XLG (MISCELLANEOUS) ×2

## 2019-05-20 NOTE — Discharge Instructions (Signed)
Jerry Mulligan, MD  [facility]  Phone: [phone]  Fax: [phone]   Discharge Instructions after Reverse Shoulder Replacement   1. Activity/Sling: You are to be non-weight bearing on operative extremity. A sling/shoulder immobilizer has been provided for you. Only remove the sling to perform elbow, wrist, and hand RoM exercises and hygiene/dressing. Active reaching and lifting are not permitted. You will be given further instructions on sling use at your first physical therapy visit and postoperative visit with Dr. Posey Pronto.   2. Dressings: Dressing may be removed at 1st physical therapy visit (~3-4 days after surgery). Afterwards, you may either leave open to air (if no drainage) or cover with dry, sterile dressing. If you have steri-strips on your wound, please do not remove them. They will fall off on their own. You may shower 5 days after surgery. Please pat incision dry. Do not rub or place any shear forces across incision. If there is drainage or any opening of incision after 5 days, please notify our offices immediately.   3. Driving:  Plan on not driving for six weeks. Please note that you are advised NOT to drive while taking narcotic pain medications as you may be impaired and unsafe to drive.  4. Medications:  - You have been provided a prescription for narcotic pain medicine (usually oxycodone). After surgery, take 1-2 narcotic tablets every 4 hours if needed for severe pain. Please start this as soon as you begin to start having pain (if you received a nerve block, start taking as soon as this wears off).  - A prescription for anti-nausea medication will be provided in case the narcotic medicine causes nausea - take 1 tablet every 6 hours only if nauseated.  - Resume taking home Xarelto the day after surgery. -Take tylenol 1000mg  (2 Extra strength or 3 regular strength tablets) every 8 hours for pain. This will reduce the amount of narcotic medication needed. May stop  tylenol when you are having minimal pain. - Take a stool softener (Colace, Dulcolax or Senakot) if you are using narcotic pain medications to help with constipation that is associated with narcotic use. - DO NOT take ANY nonsteroidal anti-inflammatory pain medications: Advil, Motrin, Ibuprofen, Aleve, Naproxen, or Naprosyn.  If you are taking prescription medication for anxiety, depression, insomnia, muscle spasm, chronic pain, or for attention deficit disorder you are advised that you are at a higher risk of adverse effects with use of narcotics post-op, including narcotic addiction/dependence, depressed breathing, death. If you use non-prescribed substances: alcohol, marijuana, cocaine, heroin, methamphetamines, etc., you are at a higher risk of adverse effects with use of narcotics post-op, including narcotic addiction/dependence, depressed breathing, death. You are advised that taking > 50 morphine milligram equivalents (MME) of narcotic pain medication per day results in twice the risk of overdose or death. For your prescription provided: oxycodone 5 mg - taking more than 6 tablets per day after the first few days of surgery.  5. Physical Therapy: 1-2 times per week for ~12 weeks. Therapy typically starts on post operative Day 3 or 4. You have been provided an order for physical therapy. The therapist will provide home exercises. Please contact our offices if this appointment has not been scheduled.   6. Work: May do light duty/desk job in approximately 2 weeks when off of narcotics, pain is well-controlled, and swelling has decreased if able to function with one arm in sling. Full work may take 6 weeks if light motions and function of both arms is required.  Lifting jobs may require 12 weeks.  7. Post-Op Appointments: Your first post-op appointment will be with Dr. Posey Pronto in approximately 2 weeks time.   If you find that they have not been scheduled please call the Orthopaedic Appointment  front desk at [phone].                               Jerry Mulligan, MD [facility] Phone: [phone] Fax: [phone]   REVERSE SHOULDER ARTHROPLASTY REHAB GUIDELINES   These guidelines should be tailored to individual patients based on their rehab goals, age, precautions, quality of repair, etc.  Progression should be based on patient progress and approval by the referring physician.  PHASE 1 - Day 1 through Week 2  GENERAL GUIDELINES AND PRECAUTIONS . Sling wear 24/7 except during grooming and home exercises (3 to 5 times daily) . Avoid shoulder extension such that the arm is posterior the frontal plane.  When patients recline, a pillow should be placed behind the upper arm and sling should be on.  They should be advised to always be able to see the elbow . Avoid combined IR/ADD/EXT, such as hand behind back to prevent dislocation . Avoid combined IR and ADD such as reaching across the chest to prevent dislocation . No AROM . No submersion in pool/water for 4 weeks . No weight bearing through operative arm (as in transfers, walker use, etc.)  GOALS . Maintain integrity of joint replacement; protect soft tissue healing . Increase PROM for elevation to 120 and ER to 30 (will remain the goal for first 6 weeks) . Optimize distal UE circulation and muscle activity (elbow, wrist and hand) . Instruct in use of sling for proper fit, polar care device for ice application after HEP, signs/symptoms of infection  EXERCISES . Active elbow, wrist and hand . Passive forward elevation in scapular plane to 90-120 max motion; ER in scapular plane to 30 . Active scapular retraction with arms resting in neutral position  CRITERIA TO PROGRESS TO PHASE 2 . Low pain (less than 3/10) with shoulder PROM . Healing of incision without signs of infection . Clearance by MD to advance after 2 week MD check up  PHASE 2 - 2 weeks - 6 weeks  GENERAL GUIDELINES AND  PRECAUTIONS . Sling may be removed while at home; worn in community without abduction pillow . May use arm for light activities of daily living (such as feeding, brushing teeth, dressing.) with elbow near  the side of the body  and arm in front of the body- no active lifting of the arm . May submerge in water (tub, pool, [place], etc.) after 4 weeks . Continue to avoid WBing through the operative arm . Continue to avoid combined IR/EXT/ADD (hand behind the back) and IR/ADD  (reaching across chest) for dislocation precautions  GOALS .  Achieve passive elevation to 120 and ER to 30 .  Low (less than 3/10) to no pain .  Ability to fire all heads of the deltoid  EXERCISES . May discontinue grip, and active elbow and wrist exercises since using the arm in ADL's  with sling removed around the home . Continue passive elevation to 120 and ER to 30, both in scapular plane with arm supported on table top . Add submaximal isometrics, pain free effort, for all functional heads of deltoid (anterior, posterior, middle)  Ensure that with posterior deltoid isometric the shoulder does not move into extension  and the arm remains anterior the frontal plane . At 4 weeks:  begin to place arm in balanced position of 90 deg elevation in supine; when patient able to hold this position with ease, may begin reverse pendulums clockwise and counterclockwise  CRITERIA TO PROGRESS TO PHASE 3 . Passive forward elevation in scapular plane to 120; passive ER in scapular plane to 30 . Ability to fire isometrically all heads of the deltoid muscle without pain . Ability to place and hold the arm in balanced position (90 deg elevation in supine)  PHASE 3 - 6 weeks to 3 months  GENERAL GUIDELINES AND PRECAUTIONS . Discontinue use of sling . Avoid forcing end range motion in any direction to prevent dislocation  . May advance use of the arm actively in ADL's without being restricted to arm by the side of the body, however,  avoid heavy lifting and sports (forever!) . May initiate functional IR behind the back gently . NO UPPER BODY ERGOMETER   GOALS . Optimize PROM for elevation and ER in scapular plane with realistic expectation that max  mobility for elevation is usually around 145-160 passively; ER 40 to 50 passively; functional IR to L1 . Recover AROM to approach as close to PROM available as possible; may expect 135-150 deg active elevation; 30 deg active ER; active functional IR to L1 . Establish dynamic stability of the shoulder with deltoid and periscapular muscle gradual strengthening  EXERCISES . Forward elevation in scapular plane active progression: supine to incline, to vertical; short to long lever arm . Balanced position long lever arm AROM . Active ER/IR with arm at side . Scapular retraction with light band resistance . Functional IR with hand slide up back - very gentle and gradual . NO UPPER BODY ERGOMETER     CRITERIA TO PROGRESS TO PHASE 4 .  AROM equals/approaches PROM with good mechanics for elevation  .  No pain .  Higher level demand on shoulder than ADL functions   PHASE 4 12 months and beyond  [place] . No heavy lifting and no overhead sports . No heavy pushing activity . Gradually increase strength of deltoid and scapular stabilizers; also the rotator cuff if present with weights not to exceed 5 lbs . NO UPPER BODY ERGOMETER   GOALS .  Optimize functional use of the operative UE to meet the desired demands .  Gradual increase in deltoid, scapular muscle, and rotator cuff strength .  Pain free functional activities   EXERCISES . Add light hand weights for deltoid up to and not to exceed 3 lbs for anterior and posterior with long arm lift against gravity; elbow bent to 90 deg for abduction in scapular plane . Theraband progression for extension to hip with scapular depression/retraction . Theraband progression for serratus anterior punches in  supine; avoid wall, incline or prone pressups for serratus anterior . End range stretching gently without forceful overpressure in all planes (elevation in scapular plane, ER in scapular plane, functional IR) with stretching done for life as part of a daily routine . NO UPPER BODY ERGOMETER     CRITERIA FOR DISCHARGE FROM SKILLED PHYSICAL THERAPY .  Pain free AROM for shoulder elevation (expect around 135-150) .  Functional strength for all ADL's, work tasks, and hobbies approved by Psychologist, sport and exercise .  Independence with home maintenance program   NOTES: 1. With proper exercise, motion, strength, and function continue to improve even after one year. 2. The complication rate after surgery is  5 - 8%. Complications include infection, fracture, heterotopic bone formation, nerve injury, instability, rotator cuff tear, and tuberosity nonunion. Please look for clinical signs, unusual symptoms, or lack of progress with therapy and report those to Dr. Posey Pronto. Prefer more communication than less.  3. The therapy plan above only serves as a guide. Please be aware of specific individualized patient instructions as written on the prescription or through discussions with the surgeon. 4. Please call Dr. Posey Pronto if you have any specific questions or concerns [phone]

## 2019-05-20 NOTE — Evaluation (Signed)
Physical Therapy Evaluation Patient Details Name: Jerry English MRN: [id number] DOB: 03/18/1941 Today's Date: 05/20/2019   History of Present Illness  admitted for acute hospitalization status post R reverse TSA with bicep tenodesis (05/20/19), NWB.  Clinical Impression  Upon evaluation, patient alert and oriented; follows commands.  Eager for OOB to attempt toileting due to discomfort from [place]/bladder.  R UE immobilized in sling; good pain control noted.  Able to flex/extend fingers and grasp/release hand appropriately.  L UE, bilat LEs otherwise grossly symmetrical and WFL.  Able to complete sit/stand, basic transfers and gait (30' x2) without assist device, cga/close sup. Demonstrates broad BOS, reciprocal stepping pattern with fair/good stability; min cuing for R UE position  with negotiating doorways.  Will continue to assess/progress gait next session; distance/activity limited due to persistent bladder discomfort (RN aware). Of note, sats >96% on RA throughout session.  Left on RA end of session.  RN informed/aware. Would benefit from skilled PT to address above deficits and promote optimal return to PLOF.; recommend transition to home with outpatient PT follow up as appropriate.     Follow Up Recommendations Outpatient PT    Equipment Recommendations       Recommendations for Other Services       Precautions / Restrictions Precautions Precautions: Fall Precaution Comments: NWB R UE, R UE to remain in brace except for hygiene and ther-ex. FF to 90 degrees, ER to 30 degrees. Required Braces or Orthoses: Other Brace Other Brace: R UE shoulder brace Restrictions Weight Bearing Restrictions: Yes RUE Weight Bearing: Non weight bearing      Mobility  Bed Mobility Overal bed mobility: Needs Assistance Bed Mobility: Supine to Sit     Supine to sit: Min guard     General bed mobility comments: seated edge of bed upon arrival; in recliner end of session  Transfers Overall  transfer level: Needs assistance Equipment used: Rolling walker (2 wheeled) Transfers: Sit to/from Stand Sit to Stand: Min guard         General transfer comment: good LE strength/stability  Ambulation/Gait Ambulation/Gait assistance: Min guard Gait Distance (Feet): (30' x2)         General Gait Details: broad BOS, reciprocal stepping pattern with fair/good stability; min cuing for R UE position  with negotiating doorways  Stairs            Wheelchair Mobility    Modified Rankin (Stroke Patients Only)       Balance Overall balance assessment: Needs assistance Sitting-balance support: No upper extremity supported;Feet supported Sitting balance-Leahy Scale: Good     Standing balance support: No upper extremity supported Standing balance-Leahy Scale: Good                               Pertinent Vitals/Pain Pain Assessment: No/denies pain    Home Living Family/patient expects to be discharged to:: Private residence Living Arrangements: Spouse/significant other Available Help at Discharge: Family Type of Home: House Home Access: Stairs to enter Entrance Stairs-Rails: Left Entrance Stairs-Number of Steps: 6 Home Layout: One level   Additional Comments: Also has access to ramp for entry/exit of home as needed    Prior Function Level of Independence: Independent         Comments: Indep with ADLs, household and community mobilization without assist device; + driving; enjoys golfing     Hand Dominance   Dominant Hand: Right    Extremity/Trunk Assessment   Upper Extremity  Assessment Upper Extremity Assessment: (R UE immobilized in sling, able to wiggle fingers and grasp/release appropriately; L UE grossly WFL) RUE Deficits / Details: s/p reverse total shoulder RUE Sensation: decreased light touch(numbness and tingling)    Lower Extremity Assessment Lower Extremity Assessment: Overall WFL for tasks assessed(grossly 4+/5 throughout bilat  LEs)    Cervical / Trunk Assessment Cervical / Trunk Assessment: Normal  Communication   Communication: No difficulties  Cognition Arousal/Alertness: Awake/alert Behavior During Therapy: WFL for tasks assessed/performed Overall Cognitive Status: Within Functional Limits for tasks assessed                                 General Comments: persistent thoughts on catheter      General Comments General comments (skin integrity, edema, etc.): Patient able to stand with L UE support and CGA for ~ 9 minutes.    Exercises General Exercises - Upper Extremity Wrist Flexion: AROM;10 reps Wrist Extension: AROM;10 reps Digit Composite Flexion: AROM;10 reps Composite Extension: AROM;10 reps Other Exercises Other Exercises: Toilet transfer, ambulatory without assist device, cga/close sup; sit/stand from standard toilet height, close sup with grab bar.  Standing balance at sink for hand hygiene, close sup. Other Exercises: Educated provided on passive forward shoulder flexion to 90 degrees while seated. Other Exercises: Education on not using R UE to hold small objects d/t biceps tenodesis. Other Exercises: Educated patient on positioning of R UE while in sling while seated and supine. Other Exercises: Education provided on tightening/loosening shoulder sling and current, advised wear schedule.   Assessment/Plan    PT Assessment Patient needs continued PT services  PT Problem List Decreased strength;Decreased balance;Decreased activity tolerance;Decreased range of motion;Decreased mobility;Decreased coordination;Decreased knowledge of use of DME;Decreased safety awareness;Decreased knowledge of precautions;Decreased skin integrity       PT Treatment Interventions DME instruction;Gait training;Stair training;Functional mobility training;Therapeutic activities;Therapeutic exercise;Balance training;Patient/family education    PT Goals (Current goals can be found in the Care Plan  section)  Acute Rehab PT Goals Patient Stated Goal: I want to go to the bathroom by myself PT Goal Formulation: With patient Time For Goal Achievement: 06/03/19 Potential to Achieve Goals: Good    Frequency 7X/week   Barriers to discharge        Co-evaluation               AM-PAC PT "6 Clicks" Mobility  Outcome Measure Help needed turning from your back to your side while in a flat bed without using bedrails?: A Little Help needed moving from lying on your back to sitting on the side of a flat bed without using bedrails?: A Little Help needed moving to and from a bed to a chair (including a wheelchair)?: A Little Help needed standing up from a chair using your arms (e.g., wheelchair or bedside chair)?: A Little Help needed to walk in hospital room?: A Little Help needed climbing 3-5 steps with a railing? : A Little 6 Click Score: 18    End of Session Equipment Utilized During Treatment: Gait belt Activity Tolerance: Patient tolerated treatment well Patient left: in chair;with call bell/phone within reach;with chair alarm set;with family/visitor present Nurse Communication: Mobility status PT Visit Diagnosis: Muscle weakness (generalized) (M62.81);Difficulty in walking, not elsewhere classified (R26.2)    Time: [id number] PT Time Calculation (min) (ACUTE ONLY): 32 min   Charges:   PT Evaluation $PT Eval Moderate Complexity: 1 Mod PT Treatments $Therapeutic Activity: 8-22 mins  Tayshun Gappa H. Owens Shark, PT, DPT, NCS 05/20/19, 4:51 PM [phone]

## 2019-05-20 NOTE — H&P (Addendum)
Paper H&P to be scanned into permanent record. H&P reviewed. No significant changes noted. Heart rate irregularly irregular without tachycardia; lungs sounds clear.  Patient was initially scheduled for RSA last week on 05/13/19, but was cancelled due to concern regarding his pacemaker function. In the interim, the anesthesiology and cardiology teams have discussed his pacemaker use and functioning with optimal settings during surgery, and will plan to proceed today.

## 2019-05-20 NOTE — Transfer of Care (Signed)
Immediate Anesthesia Transfer of Care Note  Patient: Jerry English  Procedure(s) Performed: RIGHT REVERSE SHOULDER ARTHROPLASTY (Right Shoulder)  Patient Location: PACU  Anesthesia Type:General  Level of Consciousness: drowsy  Airway & Oxygen Therapy: Patient Spontanous Breathing and Patient connected to face mask oxygen  Post-op Assessment: Report given to RN and Post -op Vital signs reviewed and stable  Post vital signs: Reviewed and stable  Last Vitals:  Vitals Value Taken Time  BP 137/68 05/20/19 1106  Temp 36.3 C 05/20/19 1105  Pulse 66 05/20/19 1106  Resp 19 05/20/19 1106  SpO2 97 % 05/20/19 1106  Vitals shown include unvalidated device data.  Last Pain:  Vitals:   05/20/19 1105  TempSrc:   PainSc: Asleep         Complications: No apparent anesthesia complications

## 2019-05-20 NOTE — Progress Notes (Signed)
Removed [place] , patient voided like 10cc , urinal in room . Ambulated with therapy . NWB to right shoulder. No c/o pain or discomfort
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
## 2019-05-20 NOTE — Anesthesia Procedure Notes (Signed)
Procedure Name: Intubation Date/Time: 05/20/2019 7:44 AM Performed by: Gentry Fitz, CRNA Pre-anesthesia Checklist: Patient identified, Emergency Drugs available, Suction available and Patient being monitored Patient Re-evaluated:Patient Re-evaluated prior to induction Oxygen Delivery Method: Circle system utilized Preoxygenation: Pre-oxygenation with 100% oxygen Induction Type: IV induction Ventilation: Mask ventilation without difficulty Laryngoscope Size: McGraph and 4 Grade View: Grade I Tube type: Oral Tube size: 7.5 mm Number of attempts: 1 Airway Equipment and Method: Stylet Placement Confirmation: ETT inserted through vocal cords under direct vision,  positive ETCO2 and breath sounds checked- equal and bilateral Secured at: 23 cm Tube secured with: Tape Dental Injury: Teeth and Oropharynx as per pre-operative assessment

## 2019-05-20 NOTE — Op Note (Signed)
SURGERY DATE: 05/20/2019   PRE-OP DIAGNOSIS:  1. Right shoulder rotator cuff arthropathy   POST-OP DIAGNOSIS:  1. Right shoulder rotator cuff arthropathy   PROCEDURES:  1. Right reverse total shoulder arthroplasty 2. Right biceps tenodesis   SURGEON: Cato Mulligan, MD  ASSISTANTS: Reche Dixon, PA   ANESTHESIA: Gen + interscalene block   ESTIMATED BLOOD LOSS: 250cc   TOTAL IV FLUIDS: per anesthesia record  IMPLANTS: DJ[employer] Surgical: RSP Glenoid Head w/Retaining screw 32N; Monoblock Reverse Shoulder Baseplate with 1.8EX central screw;4 locking screws into baseplate (93ZJ, 69CV, 89FY, 24m); Standard Short Humeral Stem 12 x 45m Neutral Small Socket Insert;    INDICATION(S):  RoCHRSTOPHER MALENFANTs a [age].o. male with chronic shoulder pain with inability to lift arm overhead. Imaging consistent with massive, irreparable rotator cuff tear. Conservative measures including medications and cortisone injections have not provided adequate relief. After discussion of risks, benefits, and alternatives to surgery, the patient elected to proceed with reverse shoulder arthroplasty and biceps tenodesis.   OPERATIVE FINDINGS: massive rotator cuff tear (complete supraspinatus, partial infraspinatus, partial subscapularis)   OPERATIVE REPORT:   I identified Jerry English in the pre-operative holding area. Informed consent was obtained and the surgical site was marked. I reviewed the risks and benefits of the proposed surgical intervention and the patient (and/or patient's guardian) wished to proceed. An interscalene block with Exparel was administered by the Anesthesia team. The patient was transferred to the operative suite and general anesthesia was administered. The patient was placed in the beach chair position with the head of the bed elevated approximately 45 degrees. All down side pressure points were appropriately padded. Pre-op exam under anesthesia confirmed some stiffness and crepitus. Appropriate IV  antibiotics were administered. The extremity was then prepped and draped in standard fashion. A time out was performed confirming the correct extremity, correct patient, and correct procedure.   We used the standard deltopectoral incision from the coracoid to ~12cm distal. We found the cephalic vein and took it laterally. We opened the deltopectoral interval widely and placed retractors under the CA ligament in the subacromial space and under the deltoid tendon at its insertion. We then abducted and internally rotated the arm and released the underlying bursa between these retractors, taking care not to damage the circumflex branch of the axillary nerve.   Next, we brought the arm back in adduction at slight forward flexion with external rotation. We opened the clavipectoral fascia lateral to the conjoint tendon. We gently palpated the axillary nerve and verified its position and continuity on both sides of the humerus with a Tug test. This test was repeated multiple times during the procedure for nerve localization and confirmed to be intact at the end of the case. We then cauterized the anterior humeral circumflex ("Three sisters") vessels. The arm was then internally rotated, we cut the falciform ligament at approximately 1 cm of the upper portion of the pectoralis major insertion. Next we unroofed the bicipital groove. We proceeded with a soft tissue biceps tenodesis given the pathology (significant thickening and tendinopathy proximally) of the tendon.  After opening the biceps tendon sheath all the way to the supraglenoid tubercle, we performed a biceps tenodesis with two #2 TiCron sutures to the upper border of the pectoralis major. The proximal portion of the tendon was excised.   At this point, we could see that the supraspinatus was completely torn with a bald humeral head superiorly. The subscapularis also had a partial tear of the superior  fibers. We performed a subscapularis peel using  electrocautery to remove the anterior capsule and subscapularis off of the humeral head. We released the inferior capsule from the humerus all the way to the posterior band of the inferior glenohumeral ligament. When this was complete we gently dislocated the shoulder up into the wound. We removed any osteophytes and made our cut with the appropriate inclination in 30 degrees of retroversion  We then turned our attention back to the glenoid. The proximal humerus was retracted posteriorly. The anterior capsule was dissected free from the subscapularis. The anterior capsule was then excised, exposing the anterior glenoid. We then grasped the labrum and removed it circumferentially. During the glenoid exposure, the axillary nerve was protected the entire time.    A patient-specific guide was used to drill the central guidepin. A tap was placed, matching the trajectory of the guidepin. An appropriately sized reamer was used to ream the glenoid. The monoblock baseplate was inserted and excellent fixation was achieved such that the entire scapula rotated with further attempted seating of the baseplate. The 4 peripheral screws were drilled, measured, and placed. A 32N glenosphere was then placed and tightened.   We then turned our attention back to the humerus. We sized for a standard prosthesis. We sequentially used larger diameter canal finders until we met significant resistance and sequential broaching was performed to this size listed above. We trialed both a 0 and +4 poly. The humerus was trialed and noted to have satisfactory stability, motion, and deltoid tension with a 0 poly. We drilled 3 holes and passed 3 #2 Fiberwire sutures through for our subscapularis repair. The final humeral component was then inserted. We placed an 0 poly. The humerus was reduced and final confirmation of motion, tension, and stability were satisfactory. A Hemovac drain was placed. Subscapularis was repaired with the previously  passed #2 FiberWire sutures after passing them through the subscapularis.   We again verified the tension on the axillary nerve, appropriate range of motion, stability of the implant, and security of the subscapularis repair. We closed the deltopectoral interval deep to the cephalic vein with a running, 0-Vicryl suture. The skin was closed with 2-0 Vicryl and 4-0 Monocryl with Dermabond. Honeycomb dressing was applied. A PolarCare unit and sling were placed. Patient was extubated, transferred to a stretcher bed and to the post antesthesia care unit in stable condition.   Of note, assistance from a PA was essential to performing the surgery.  PA was present for the entire surgery.  PA assisted with patient positioning, retraction, instrumentation, and wound closure. The surgery would have been more difficult and had longer operative time without PA assistance.    POSTOPERATIVE PLAN: The patient will be admitted with plan for discharge home on POD#1. Operative arm to remain in sling at all times except RoM exercises and hygiene. Can perform pendulums, elbow/wrist/hand RoM exercises. Passive RoM allowed to 90 FF and 30 ER. Resume home Xarelto on POD#1. Plan for PT starting on POD #3-4. Patient to return to clinic in ~2 weeks for post-operative appointment.

## 2019-05-20 NOTE — Evaluation (Signed)
Occupational Therapy Evaluation Patient Details Name: Jerry English MRN: [id number] DOB: 1940/05/29 Today's Date: 05/20/2019    History of Present Illness Jerry English is a 79 y.o. male with chronic shoulder pain with inability to lift arm overhead. Imaging consistent with massive, irreparable rotator cuff tear.  Patient is s/p right reverse total shoulder and bicep tenodesis on 05/20/19   Clinical Impression   Patient seen this afternoon for occupational therapy evaluation.  Patient's wife present at time of session.  Patient with complaints of discomfort d/t Corey catheter in place.  Communication with nurse on how long catheter will be present; will follow up as able.  Patient agreeable to therapy with no other complaints of pain.  Patient able to sit at side of bed with CGA and extra time with cues on sequencing and hand placement.  Patient able to perform lateral scoots with L UE and CGA.  Patient stood at [facility] with CGA for ~9 minutes with L UE for support.  Provided education to patient and wife on simple, AROM ther-ex for R forearm within protocol.  Verbalized and demonstrated understanding.  Provided education to pt and wife on surgical precautions including NWB status, no holding items in R UE and shoulder FF to 90 degrees, ER to 30 degrees.  Wife demonstrated some concern with doffing sling at this time.  Therapist provided education on tightening/loosening straps and positional degrees for optimal healing of R UE.  Discussed postioning of R UE while in chair and supine in bed.  Provided education on compensatory movements for hygiene of R UE.  Pt and wife verbalized understanding of all education.  Provided handout on ADL care s/p surgery, donning/doffing procedures of polar care and simple HEP within protocol.  Patient would benefit from continued skilled occupational therapy services to address shoulder protocol, ADL retraining, family education, compensatory training, energy conservation  techniques and overall safety techniques.  Based on today's performance, recommending HH OT at time of discharge until able to attend OP.    Follow Up Recommendations  Home health OT    Equipment Recommendations  None recommended by OT    Recommendations for Other Services       Precautions / Restrictions Precautions Precautions: Other (comment) Precaution Comments: NWB R UE, R UE to remain in brace except for hygiene and ther-ex Required Braces or Orthoses: Other Brace Other Brace: R UE shoulder brace Restrictions Weight Bearing Restrictions: Yes RUE Weight Bearing: Non weight bearing      Mobility Bed Mobility Overal bed mobility: Needs Assistance Bed Mobility: Supine to Sit     Supine to sit: Min guard     General bed mobility comments: education on non use of R UE  Transfers Overall transfer level: Needs assistance Equipment used: Rolling walker (2 wheeled) Transfers: Sit to/from Stand Sit to Stand: Min guard         General transfer comment: Cueing on sequencing, hand placement/one handed    Balance                                           ADL either performed or assessed with clinical judgement   ADL Overall ADL's : Needs assistance/impaired     Grooming: Minimal assistance Grooming Details (indicate cue type and reason): education on one handed techniques Upper Body Bathing: Maximal assistance;Sitting       Upper Body Dressing : Maximal  assistance Upper Body Dressing Details (indicate cue type and reason): maintaining precautions Lower Body Dressing: Moderate assistance;Sitting/lateral leans   Toilet Transfer: Designer, television/film set Details (indicate cue type and reason): able to maintain NWB on R UE Toileting- Clothing Manipulation and Hygiene: Moderate assistance         General ADL Comments: Requires extra time due to lethargy after surgery.  Patient requires more education on one handed techniques.      Vision Baseline Vision/History: Wears glasses Vision Assessment?: No apparent visual deficits     Perception     Praxis      Pertinent Vitals/Pain Pain Assessment: No/denies pain     Hand Dominance     Extremity/Trunk Assessment Upper Extremity Assessment Upper Extremity Assessment: RUE deficits/detail RUE Deficits / Details: s/p reverse total shoulder RUE Sensation: decreased light touch(numbness and tingling)   Lower Extremity Assessment Lower Extremity Assessment: Defer to PT evaluation   Cervical / Trunk Assessment Cervical / Trunk Assessment: Normal   Communication     Cognition Arousal/Alertness: Awake/alert Behavior During Therapy: WFL for tasks assessed/performed Overall Cognitive Status: Within Functional Limits for tasks assessed                                 General Comments: persistent thoughts on catheter   General Comments  Patient able to stand with L UE support and CGA for ~ 9 minutes.    Exercises General Exercises - Upper Extremity Wrist Flexion: AROM;10 reps Wrist Extension: AROM;10 reps Digit Composite Flexion: AROM;10 reps Composite Extension: AROM;10 reps Other Exercises Other Exercises: Provided education on lower arm (R UE) AROM including supination/pronation, digit extension/flexion, wrist flexion/extension, digit abduction/adduction. Other Exercises: Educated provided on passive forward shoulder flexion to 90 degrees while seated. Other Exercises: Education on not using R UE to hold small objects d/t biceps tenodesis. Other Exercises: Educated patient on positioning of R UE while in sling while seated and supine. Other Exercises: Education provided on tightening/loosening shoulder sling and current, advised wear schedule.   Shoulder Instructions      Home Living Family/patient expects to be discharged to:: Private residence Living Arrangements: Spouse/significant other Available Help at Discharge: Family Type of  Home: House Home Access: Stairs to enter Technical brewer of Steps: 6 Entrance Stairs-Rails: Left Home Layout: One level                         Prior Functioning/Environment Level of Independence: Independent                OT Problem List: Decreased strength;Decreased range of motion;Decreased activity tolerance;Decreased knowledge of precautions;Decreased coordination;Impaired sensation;Impaired UE functional use      OT Treatment/Interventions: Self-care/ADL training;Therapeutic exercise;Patient/family education;Energy conservation;Therapeutic activities;DME and/or AE instruction    OT Goals(Current goals can be found in the care plan section) Acute Rehab OT Goals Patient Stated Goal: I want to go to the bathroom by myself OT Goal Formulation: With patient/family Time For Goal Achievement: 06/03/19 Potential to Achieve Goals: Good  OT Frequency: Min 3X/week   Barriers to D/C:            Co-evaluation              AM-PAC OT "6 Clicks" Daily Activity     Outcome Measure Help from another person eating meals?: A Little Help from another person taking care of personal grooming?: A Little Help from another person  toileting, which includes using toliet, bedpan, or urinal?: A Lot Help from another person bathing (including washing, rinsing, drying)?: A Lot Help from another person to put on and taking off regular upper body clothing?: A Lot Help from another person to put on and taking off regular lower body clothing?: A Lot 6 Click Score: 14   End of Session Equipment Utilized During Treatment: Rolling walker;Gait belt Nurse Communication: Other (comment)(Patient requesting to have catheter removed)  Activity Tolerance: Patient tolerated treatment well Patient left: in bed;with call bell/phone within reach;with family/visitor present;Other (comment)(Physical therapist in room)  OT Visit Diagnosis: Muscle weakness (generalized) (M62.81)                 Time: [id number] OT Time Calculation (min): 36 min Charges:  OT General Charges $OT Visit: 1 Visit OT Evaluation $OT Eval Low Complexity: 1 Low OT Treatments $Self Care/Home Management : 8-22 mins $Therapeutic Activity: 8-22 mins $Therapeutic Exercise: 8-22 mins  Baldomero Lamy, MS, OTR/L 05/20/19, 4:01 PM

## 2019-05-20 NOTE — Anesthesia Procedure Notes (Signed)
Anesthesia Regional Block: Interscalene brachial plexus block   Pre-Anesthetic Checklist: ,, timeout performed, Correct Patient, Correct Site, Correct Laterality, Correct Procedure, Correct Position, site marked, Risks and benefits discussed,  Surgical consent,  Pre-op evaluation,  At surgeon's request and post-op pain management  Laterality: Right  Prep: chloraprep       Needles:  Injection technique: Single-shot  Needle Type: Echogenic Stimulator Needle     Needle Length: 10cm  Needle Gauge: 20     Additional Needles:   Procedures:, nerve stimulator,,, ultrasound used (permanent image in chart),,,,   Nerve Stimulator or Paresthesia:  Response: biceps flexion,   Additional Responses:   Narrative:  Start time: 05/20/2019 7:14 AM End time: 05/20/2019 7:24 AM Injection made incrementally with aspirations every 5 mL.  Performed by: Personally   Additional Notes: Functioning IV was confirmed and monitors were applied.Sterile prep and drape,hand hygiene and sterile gloves were used.  Negative aspiration and negative test dose prior to incremental administration of local anesthetic.No pain with injection and infused easily with good ultrasound spread in front and back of the nerve roots.   The patient tolerated the procedure well.

## 2019-05-20 NOTE — Progress Notes (Signed)
PHARMACIST - PHYSICIAN ORDER COMMUNICATION  CONCERNING: Amiodarone and Simvastatin  and risk of rhabdomyolysis  DESCRIPTION:  Patients on amiodarone and simvastatin >20 mg/day have reported cases of rhabdomyolysis. Pharmacy is to assess simvastatin dose.   If >20 mg, substitute atorvastatin (Lipitor) 1mg  for each 2mg  simvastatin and leave communication form advising MD of change.  This patient is ordered simvastatin  40mg .    ACTION TAKEN: Per protocol pharmacy has discontinued the patient's order for simvastatin and replaced it with Atorvastatin 20 mg.   Pernell Dupre, PharmD, BCPS Clinical Pharmacist 05/20/2019 1:54 PM

## 2019-05-21 LAB — BASIC METABOLIC PANEL
Anion gap: 14 (ref 5–15)
BUN: 23 mg/dL (ref 8–23)
CO2: 21 mmol/L — ABNORMAL LOW (ref 22–32)
Calcium: 8.3 mg/dL — ABNORMAL LOW (ref 8.9–10.3)
Chloride: 98 mmol/L (ref 98–111)
Creatinine, Ser: 1.19 mg/dL (ref 0.61–1.24)
GFR calc Af Amer: >60 mL/min (ref >60)
GFR calc non Af Amer: 59 mL/min — ABNORMAL LOW (ref >60)
Glucose, Bld: 128 mg/dL — ABNORMAL HIGH (ref 70–99)
Potassium: 4.8 mmol/L (ref 3.5–5.1)
Sodium: 133 mmol/L — ABNORMAL LOW (ref 135–145)

## 2019-05-21 LAB — CBC
HCT: 35.2 % — ABNORMAL LOW (ref 39.0–52.0)
Hemoglobin: 11.9 g/dL — ABNORMAL LOW (ref 13.0–17.0)
MCH: 34.1 pg — ABNORMAL HIGH (ref 26.0–34.0)
MCHC: 33.8 g/dL (ref 30.0–36.0)
MCV: 100.9 fL — ABNORMAL HIGH (ref 80.0–100.0)
Platelets: 186 10*3/uL (ref 150–400)
RBC: 3.49 MIL/uL — ABNORMAL LOW (ref 4.22–5.81)
RDW: 14.6 % (ref 11.5–15.5)
WBC: 14.2 10*3/uL — ABNORMAL HIGH (ref 4.0–10.5)
nRBC: 0 % (ref 0.0–0.2)

## 2019-05-21 MED ORDER — ONDANSETRON HCL 4 MG PO TABS: 4.0000 mg | ORAL_TABLET | Freq: Four times a day (QID) | ORAL | 0 refills | Status: DC | PRN

## 2019-05-21 MED ORDER — OXYCODONE HCL 5 MG PO TABS: 5.0000 mg | ORAL_TABLET | ORAL | 0 refills | Status: DC | PRN

## 2019-05-21 NOTE — Progress Notes (Signed)
Occupational Therapy Treatment Patient Details Name: Jerry English MRN: [id number] DOB: 1940/09/02 Today's Date: 05/21/2019    History of present illness admitted for acute hospitalization status post R reverse TSA with bicep tenodesis (05/20/19), NWB.   OT comments  Patient seen this AM for OT session.  Patient planning on returning home today.  Therapist went over handout provided at evaluation to discuss any questions with patient and pt's spouse.  Further educated on safe movements of R UE within protocol.  Patient and pt's wife with no further questions.  Thanked therapist for her education and assistance.  Based on today's performance, recommend d/c plan remains appropriate.     Follow Up Recommendations  Home health OT    Equipment Recommendations  None recommended by OT    Recommendations for Other Services      Precautions / Restrictions Precautions Precautions: Fall Precaution Comments: NWB R UE, R UE to remain in brace except for hygiene and ther-ex. FF to 90 degrees, ER to 30 degrees. Required Braces or Orthoses: Other Brace Other Brace: R UE shoulder brace Restrictions Weight Bearing Restrictions: Yes RUE Weight Bearing: Non weight bearing       Mobility Bed Mobility Overal bed mobility: Needs Assistance Bed Mobility: Supine to Sit     Supine to sit: Supervision     General bed mobility comments: Pt demonstrated safe ability to exit L side of bed with HOB slightly evelated. increased time to perform however no assistance required.  Transfers Overall transfer level: Needs assistance Equipment used: Rolling walker (2 wheeled) Transfers: Sit to/from Stand Sit to Stand: Min guard         General transfer comment: CGA for safety + use of gait belt. Pt does have slight unsteadiness but did not require therapist intervention. Pt demonstrated safer ability with use of SPC.    Balance                                           ADL either  performed or assessed with clinical judgement   ADL Overall ADL's : Needs assistance/impaired     Grooming: Minimal assistance Grooming Details (indicate cue type and reason): education on one handed techniques Upper Body Bathing: Maximal assistance;Sitting       Upper Body Dressing : Maximal assistance Upper Body Dressing Details (indicate cue type and reason): maintaining precautions Lower Body Dressing: Moderate assistance;Sitting/lateral leans   Toilet Transfer: Designer, television/film set Details (indicate cue type and reason): able to maintain NWB on R UE Toileting- Clothing Manipulation and Hygiene: Minimal assistance         General ADL Comments: Requires extra time due to lethargy after surgery.  Patient requires more education on one handed techniques.     Vision Baseline Vision/History: Wears glasses Vision Assessment?: No apparent visual deficits   Perception     Praxis      Cognition Arousal/Alertness: Awake/alert Behavior During Therapy: WFL for tasks assessed/performed Overall Cognitive Status: Within Functional Limits for tasks assessed                                 General Comments: Spouse present for session        Exercises General Exercises - Upper Extremity Wrist Flexion: AROM;10 reps Wrist Extension: AROM;10 reps Other Exercises Other Exercises: Discussed handout of precautions  and compensatory techniques Other Exercises: Educated provided on safe movement of R UE within protocol   Shoulder Instructions       General Comments      Pertinent Vitals/ Pain       Pain Assessment: No/denies pain  Home Living                                          Prior Functioning/Environment              Frequency  Min 3X/week        Progress Toward Goals  OT Goals(current goals can now be found in the care plan section)  Progress towards OT goals: Progressing toward goals  Acute Rehab OT  Goals Patient Stated Goal: To go home safely  Plan Discharge plan remains appropriate    Co-evaluation                 AM-PAC OT "6 Clicks" Daily Activity     Outcome Measure                    End of Session    OT Visit Diagnosis: Muscle weakness (generalized) (M62.81)   Activity Tolerance Patient tolerated treatment well   Patient Left in bed;with call bell/phone within reach;with family/visitor present;Other (comment)   Nurse Communication          [id number] OT Time Calculation (min): 10 min  Charges: OT General Charges $OT Visit: 1 Visit OT Treatments $Self Care/Home Management : 8-22 mins  Baldomero Lamy, MS, OTR/L 05/21/19, 10:20 AM

## 2019-05-21 NOTE — TOC Transition Note (Signed)
Transition of Care [facility]) - CM/SW Discharge Note   Patient Details  Name: Jerry English MRN: [id number] Date of Birth: [birth year]  [facility] CM/SW Contact:  Su Hilt, RN Phone Number: 05/21/2019, 8:49 AM   Clinical Narrative:   Patient to DC home with Outpatient PT today, No CM needs    Final next level of care: OP Rehab Barriers to Discharge: Barriers Resolved   Patient Goals and CMS Choice        Discharge Placement                       Discharge Plan and Services                                     Social Determinants of Health (SDOH) Interventions     Readmission Risk Interventions No flowsheet data found.
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
## 2019-05-21 NOTE — Plan of Care (Signed)

## 2019-05-21 NOTE — Progress Notes (Signed)
Physical Therapy Treatment Patient Details Name: Jerry English MRN: [id number] DOB: January 15, 1941 Today's Date: 05/21/2019    History of Present Illness admitted for acute hospitalization status post R reverse TSA with bicep tenodesis (05/20/19), NWB.    PT Comments    Pt was long sitting in bed with spouse at bedside upon arriving. He is A and O x 4 and agreeable to PT session. Pt requested to use BR. He exited L side of bed without physical assist. Stood and ambulated to BR without AD with close SBA. Ambulated 2 laps in hallway 160 ft. Once without AD and once with SPC. He tolerated well but demonstrated improved safety with use of SPC. Pt/spouse state they will get [facility] for home use. Therapist reviewed NWB and there ex to be performing with RUE. Both pt/spouse feel confident in performance. He will be D/C to home this date and has OP PT scheduled to address deficits in RUE/overall safe functional mobility. Pt tolerated session well and was repositioned in bed post session with call bell in reach and RN in room.     Follow Up Recommendations  Outpatient PT     Equipment Recommendations  None recommended by PT    Recommendations for Other Services       Precautions / Restrictions Precautions Precautions: Fall Precaution Comments: NWB R UE, R UE to remain in brace except for hygiene and ther-ex. FF to 90 degrees, ER to 30 degrees. Required Braces or Orthoses: Other Brace(sling) Other Brace: R UE shoulder brace Restrictions Weight Bearing Restrictions: Yes RUE Weight Bearing: Non weight bearing    Mobility  Bed Mobility Overal bed mobility: Needs Assistance Bed Mobility: Supine to Sit     Supine to sit: Supervision     General bed mobility comments: Pt demonstrated safe ability to exit L side of bed with HOB slightly evelated. increased time to perform however no assistance required.  Transfers Overall transfer level: Needs assistance Equipment used: Rolling walker (2  wheeled) Transfers: Sit to/from Stand Sit to Stand: Min guard         General transfer comment: CGA for safety + use of gait belt. Pt does have slight unsteadiness but did not require therapist intervention. Pt demonstrated safer ability with use of SPC.  Ambulation/Gait Ambulation/Gait assistance: Min guard;Supervision Gait Distance (Feet): 160 Feet Assistive device: None;Straight cane Gait Pattern/deviations: WFL(Within Functional Limits);Scissoring Gait velocity: decreased   General Gait Details: Pt ambulated 2 laps around RN unit (141ft) One lap performed without AD and one lap with SPC. improved safety with SPC. Per pt spouse, they will aquire SPC today on the way home from hospital.   Stairs Stairs: (pt has ramp at home)           Wheelchair Mobility    Modified Rankin (Stroke Patients Only)       Balance                                            Cognition Arousal/Alertness: Awake/alert Behavior During Therapy: WFL for tasks assessed/performed Overall Cognitive Status: Within Functional Limits for tasks assessed                                 General Comments: pt is A and O x 4. pt's is motivated and cooperative throughout. Supportive spouse present  throughout session      Exercises General Exercises - Upper Extremity Wrist Flexion: AROM;10 reps Wrist Extension: AROM;10 reps    General Comments        Pertinent Vitals/Pain Pain Assessment: No/denies pain    Home Living                      Prior Function            PT Goals (current goals can now be found in the care plan section) Acute Rehab PT Goals Patient Stated Goal: To go home safely Progress towards PT goals: Progressing toward goals    Frequency    7X/week      PT Plan Current plan remains appropriate    Co-evaluation              AM-PAC PT "6 Clicks" Mobility   Outcome Measure  Help needed turning from your back to your  side while in a flat bed without using bedrails?: A Little Help needed moving from lying on your back to sitting on the side of a flat bed without using bedrails?: A Little Help needed moving to and from a bed to a chair (including a wheelchair)?: A Little Help needed standing up from a chair using your arms (e.g., wheelchair or bedside chair)?: A Little Help needed to walk in hospital room?: A Little Help needed climbing 3-5 steps with a railing? : A Little 6 Click Score: 18    End of Session Equipment Utilized During Treatment: Gait belt Activity Tolerance: Patient tolerated treatment well Patient left: in bed;with family/visitor present;with call bell/phone within reach Nurse Communication: Mobility status PT Visit Diagnosis: Muscle weakness (generalized) (M62.81);Difficulty in walking, not elsewhere classified (R26.2)     Time: [id number] PT Time Calculation (min) (ACUTE ONLY): 36 min  Charges:  $Gait Training: 23-37 mins                     Julaine Fusi PTA 05/21/19, 10:12 AM

## 2019-05-21 NOTE — Anesthesia Postprocedure Evaluation (Signed)
Anesthesia Post Note  Patient: Colbi Mulry Trow  Procedure(s) Performed: RIGHT REVERSE SHOULDER ARTHROPLASTY (Right Shoulder)  Patient location during evaluation: PACU Anesthesia Type: General Level of consciousness: awake and alert Pain management: pain level controlled Vital Signs Assessment: post-procedure vital signs reviewed and stable Respiratory status: spontaneous breathing, nonlabored ventilation, respiratory function stable and patient connected to nasal cannula oxygen Cardiovascular status: blood pressure returned to baseline and stable Postop Assessment: no apparent nausea or vomiting Anesthetic complications: no     Last Vitals:  Vitals:   05/20/19 2328 05/21/19 0450  BP: 100/82 117/72  Pulse: (!) 59 60  Resp: 16 17  Temp: 36.5 C 36.5 C  SpO2: 92% 92%    Last Pain:  Vitals:   05/21/19 0450  TempSrc: Oral  PainSc:                  Molli Barrows

## 2019-05-23 DIAGNOSIS — Z96619 Presence of unspecified artificial shoulder joint: Secondary | ICD-10-CM | POA: Diagnosis not present

## 2019-05-23 DIAGNOSIS — R29898 Other symptoms and signs involving the musculoskeletal system: Secondary | ICD-10-CM | POA: Diagnosis not present

## 2019-05-23 DIAGNOSIS — M25611 Stiffness of right shoulder, not elsewhere classified: Secondary | ICD-10-CM | POA: Diagnosis not present

## 2019-05-23 DIAGNOSIS — M25511 Pain in right shoulder: Secondary | ICD-10-CM | POA: Diagnosis not present

## 2019-05-28 DIAGNOSIS — M25511 Pain in right shoulder: Secondary | ICD-10-CM | POA: Diagnosis not present

## 2019-05-28 DIAGNOSIS — Z96619 Presence of unspecified artificial shoulder joint: Secondary | ICD-10-CM | POA: Diagnosis not present
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
## 2019-06-05 DIAGNOSIS — Z96619 Presence of unspecified artificial shoulder joint: Secondary | ICD-10-CM | POA: Diagnosis not present

## 2019-06-05 DIAGNOSIS — M25511 Pain in right shoulder: Secondary | ICD-10-CM | POA: Diagnosis not present

## 2019-06-05 DIAGNOSIS — M75121 Complete rotator cuff tear or rupture of right shoulder, not specified as traumatic: Secondary | ICD-10-CM | POA: Diagnosis not present

## 2019-06-07 DIAGNOSIS — I2510 Atherosclerotic heart disease of native coronary artery without angina pectoris: Secondary | ICD-10-CM | POA: Diagnosis not present

## 2019-06-07 DIAGNOSIS — Z79899 Other long term (current) drug therapy: Secondary | ICD-10-CM | POA: Diagnosis not present

## 2019-06-07 DIAGNOSIS — E039 Hypothyroidism, unspecified: Secondary | ICD-10-CM | POA: Diagnosis not present

## 2019-06-07 DIAGNOSIS — I5022 Chronic systolic (congestive) heart failure: Secondary | ICD-10-CM | POA: Diagnosis not present

## 2019-06-07 DIAGNOSIS — E538 Deficiency of other specified B group vitamins: Secondary | ICD-10-CM | POA: Diagnosis not present

## 2019-06-13 DIAGNOSIS — M25511 Pain in right shoulder: Secondary | ICD-10-CM | POA: Diagnosis not present

## 2019-06-13 DIAGNOSIS — Z96619 Presence of unspecified artificial shoulder joint: Secondary | ICD-10-CM | POA: Diagnosis not present

## 2019-06-14 DIAGNOSIS — E782 Mixed hyperlipidemia: Secondary | ICD-10-CM | POA: Diagnosis not present

## 2019-06-14 DIAGNOSIS — E039 Hypothyroidism, unspecified: Secondary | ICD-10-CM | POA: Diagnosis not present

## 2019-06-14 DIAGNOSIS — E538 Deficiency of other specified B group vitamins: Secondary | ICD-10-CM | POA: Diagnosis not present

## 2019-06-14 DIAGNOSIS — I10 Essential (primary) hypertension: Secondary | ICD-10-CM | POA: Diagnosis not present

## 2019-06-14 DIAGNOSIS — N1831 Chronic kidney disease, stage 3a: Secondary | ICD-10-CM | POA: Diagnosis not present

## 2019-06-14 DIAGNOSIS — Z0000 Encounter for general adult medical examination without abnormal findings: Secondary | ICD-10-CM | POA: Diagnosis not present

## 2019-06-14 DIAGNOSIS — I480 Paroxysmal atrial fibrillation: Secondary | ICD-10-CM | POA: Diagnosis not present

## 2019-06-14 DIAGNOSIS — Z79899 Other long term (current) drug therapy: Secondary | ICD-10-CM | POA: Diagnosis not present

## 2019-06-14 DIAGNOSIS — Z7901 Long term (current) use of anticoagulants: Secondary | ICD-10-CM | POA: Diagnosis not present

## 2019-06-26 DIAGNOSIS — Z96619 Presence of unspecified artificial shoulder joint: Secondary | ICD-10-CM | POA: Diagnosis not present

## 2019-07-03 DIAGNOSIS — M25511 Pain in right shoulder: Secondary | ICD-10-CM | POA: Diagnosis not present

## 2019-07-03 DIAGNOSIS — Z96619 Presence of unspecified artificial shoulder joint: Secondary | ICD-10-CM | POA: Diagnosis not present

## 2019-07-03 DIAGNOSIS — M75121 Complete rotator cuff tear or rupture of right shoulder, not specified as traumatic: Secondary | ICD-10-CM | POA: Diagnosis not present

## 2019-07-10 DIAGNOSIS — Z96619 Presence of unspecified artificial shoulder joint: Secondary | ICD-10-CM | POA: Diagnosis not present

## 2019-07-16 DIAGNOSIS — I5022 Chronic systolic (congestive) heart failure: Secondary | ICD-10-CM | POA: Diagnosis not present

## 2019-07-17 DIAGNOSIS — M25511 Pain in right shoulder: Secondary | ICD-10-CM | POA: Diagnosis not present

## 2019-07-17 DIAGNOSIS — Z96619 Presence of unspecified artificial shoulder joint: Secondary | ICD-10-CM | POA: Diagnosis not present

## 2019-07-25 DIAGNOSIS — Z96619 Presence of unspecified artificial shoulder joint: Secondary | ICD-10-CM | POA: Diagnosis not present

## 2019-07-25 DIAGNOSIS — M25511 Pain in right shoulder: Secondary | ICD-10-CM | POA: Diagnosis not present

## 2019-08-01 DIAGNOSIS — Z96619 Presence of unspecified artificial shoulder joint: Secondary | ICD-10-CM | POA: Diagnosis not present

## 2019-08-01 DIAGNOSIS — M25511 Pain in right shoulder: Secondary | ICD-10-CM | POA: Diagnosis not present

## 2019-08-03 ENCOUNTER — Ambulatory Visit: Payer: PPO | Attending: Internal Medicine

## 2019-08-03 DIAGNOSIS — Z23 Encounter for immunization: Secondary | ICD-10-CM

## 2019-08-03 NOTE — Progress Notes (Signed)
   Covid-19 Vaccination Clinic  Name:  Jerry English    MRN: [id number] DOB: 22-Apr-1940  08/03/2019  [name] was observed post Covid-19 immunization for 15 minutes without incident. He was provided with Vaccine Information Sheet and instruction to access the V-Safe system.   [name] was instructed to call 911 with any severe reactions post vaccine: Marland Kitchen Difficulty breathing  . Swelling of face and throat  . A fast heartbeat  . A bad rash all over body  . Dizziness and weakness   Immunizations Administered    Name Date Dose VIS Date Route   Pfizer COVID-19 Vaccine 08/03/2019  8:20 AM 0.3 mL 05/22/2018 Intramuscular   Manufacturer: [place]   Lot: [id number]   [place]: [id number]

## 2019-08-09 DIAGNOSIS — M25511 Pain in right shoulder: Secondary | ICD-10-CM | POA: Diagnosis not present

## 2019-08-09 DIAGNOSIS — Z96619 Presence of unspecified artificial shoulder joint: Secondary | ICD-10-CM | POA: Diagnosis not present

## 2019-08-14 DIAGNOSIS — M75121 Complete rotator cuff tear or rupture of right shoulder, not specified as traumatic: Secondary | ICD-10-CM | POA: Diagnosis not present

## 2019-08-14 DIAGNOSIS — Z96611 Presence of right artificial shoulder joint: Secondary | ICD-10-CM | POA: Diagnosis not present

## 2019-08-27 ENCOUNTER — Ambulatory Visit: Payer: PPO | Attending: Internal Medicine

## 2019-08-27 DIAGNOSIS — Z23 Encounter for immunization: Secondary | ICD-10-CM

## 2019-08-27 NOTE — Progress Notes (Addendum)
   Covid-19 Vaccination Clinic  Name:  HIDEO TOBIASON    MRN: [id number] DOB: 12-Aug-1940  08/27/2019  Mr. Christopoulos was observed post Covid-19 immunization for 30 minutes without incident. He was provided with Vaccine Information Sheet and instruction to access the V-Safe system.   Mr. Carithers was instructed to call 911 with any severe reactions post vaccine: Marland Kitchen Difficulty breathing  . Swelling of face and throat  . A fast heartbeat  . A bad rash all over body  . Dizziness and weakness   Immunizations Administered    Name Date Dose VIS Date Route   Pfizer COVID-19 Vaccine 08/27/2019  8:19 AM 0.3 mL 05/22/2018 Intramuscular   Manufacturer: [place]   Lot: [id number]   [place]: [id number]

## 2019-09-17 DIAGNOSIS — H401121 Primary open-angle glaucoma, left eye, mild stage: Secondary | ICD-10-CM | POA: Diagnosis not present

## 2019-09-24 DIAGNOSIS — H401112 Primary open-angle glaucoma, right eye, moderate stage: Secondary | ICD-10-CM | POA: Diagnosis not present

## 2019-10-15 DIAGNOSIS — I5022 Chronic systolic (congestive) heart failure: Secondary | ICD-10-CM | POA: Diagnosis not present

## 2019-12-09 DIAGNOSIS — E782 Mixed hyperlipidemia: Secondary | ICD-10-CM | POA: Diagnosis not present

## 2019-12-09 DIAGNOSIS — E039 Hypothyroidism, unspecified: Secondary | ICD-10-CM | POA: Diagnosis not present

## 2019-12-09 DIAGNOSIS — N1831 Chronic kidney disease, stage 3a: Secondary | ICD-10-CM | POA: Diagnosis not present

## 2019-12-12 DIAGNOSIS — H401112 Primary open-angle glaucoma, right eye, moderate stage: Secondary | ICD-10-CM | POA: Diagnosis not present

## 2019-12-16 DIAGNOSIS — E538 Deficiency of other specified B group vitamins: Secondary | ICD-10-CM | POA: Diagnosis not present

## 2019-12-16 DIAGNOSIS — N1831 Chronic kidney disease, stage 3a: Secondary | ICD-10-CM | POA: Diagnosis not present

## 2019-12-16 DIAGNOSIS — I480 Paroxysmal atrial fibrillation: Secondary | ICD-10-CM | POA: Diagnosis not present

## 2019-12-16 DIAGNOSIS — Z79899 Other long term (current) drug therapy: Secondary | ICD-10-CM | POA: Diagnosis not present

## 2019-12-16 DIAGNOSIS — I10 Essential (primary) hypertension: Secondary | ICD-10-CM | POA: Diagnosis not present

## 2019-12-16 DIAGNOSIS — Z7901 Long term (current) use of anticoagulants: Secondary | ICD-10-CM | POA: Diagnosis not present

## 2019-12-16 DIAGNOSIS — E039 Hypothyroidism, unspecified: Secondary | ICD-10-CM | POA: Diagnosis not present

## 2019-12-16 DIAGNOSIS — E782 Mixed hyperlipidemia: Secondary | ICD-10-CM | POA: Diagnosis not present

## 2019-12-16 DIAGNOSIS — Z23 Encounter for immunization: Secondary | ICD-10-CM | POA: Diagnosis not present

## 2020-01-03 DIAGNOSIS — H401112 Primary open-angle glaucoma, right eye, moderate stage: Secondary | ICD-10-CM | POA: Diagnosis not present

## 2020-01-14 DIAGNOSIS — I420 Dilated cardiomyopathy: Secondary | ICD-10-CM | POA: Diagnosis not present

## 2020-02-12 DIAGNOSIS — Z96611 Presence of right artificial shoulder joint: Secondary | ICD-10-CM | POA: Diagnosis not present

## 2020-03-03 DIAGNOSIS — R490 Dysphonia: Secondary | ICD-10-CM | POA: Diagnosis not present

## 2020-03-03 DIAGNOSIS — K219 Gastro-esophageal reflux disease without esophagitis: Secondary | ICD-10-CM | POA: Diagnosis not present

## 2020-04-13 IMAGING — CT CT SHOULDER*R* W/O CM
3 of 6 series · 10 of 29 positions shown, 11 images · non-contrast
Comparison: None.

CLINICAL DATA: Preop planning for a shoulder arthroplasty

EXAM:
CT OF THE UPPER RIGHT EXTREMITY WITHOUT CONTRAST
TECHNIQUE: Multidetector CT imaging of the upper right extremity was performed
according to the standard protocol.

[Series 16: axial st djo matchpoint 2.00 ax · axial · 0.40mm/px · z∈[-959,-903]mm · 2 of 103 slices shown, 3 images]
[im 35/103  soft-tissue]
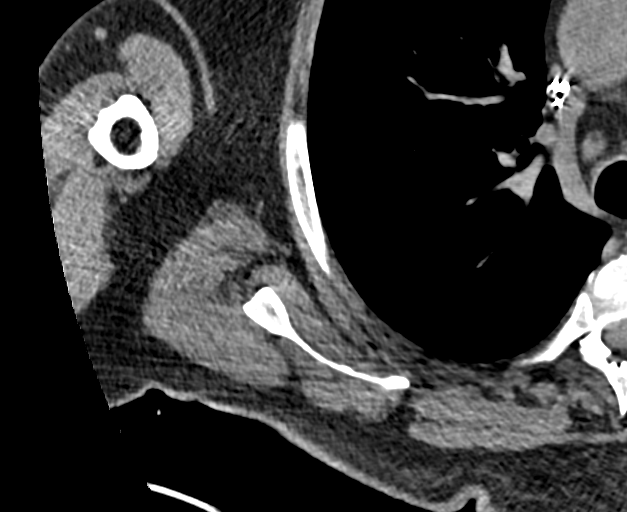
[im 35/103  bone]
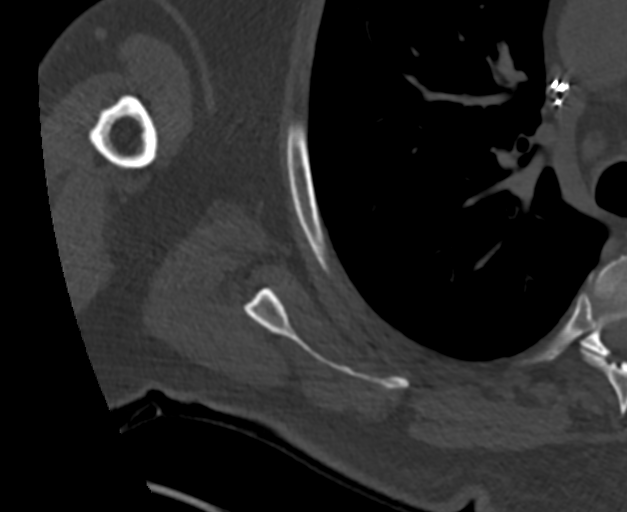
[im 69/103  bone]
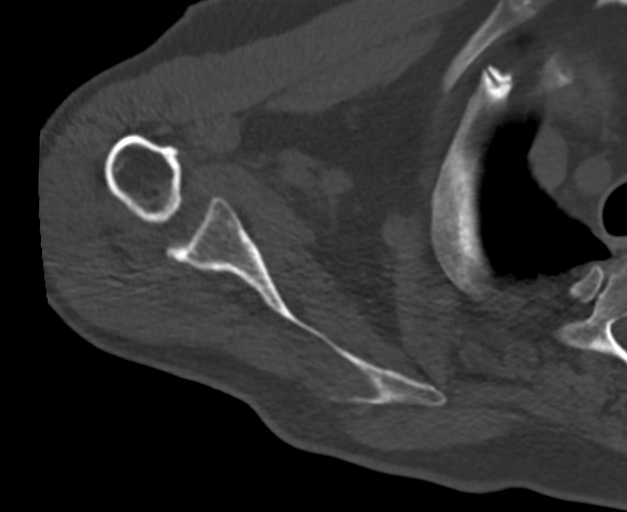

[Series 18: axial bone djo matchpoint 2.00 ax · axial · 0.40mm/px · z∈[-959,-903]mm · 2 of 103 slices shown]
[im 35/103  bone]
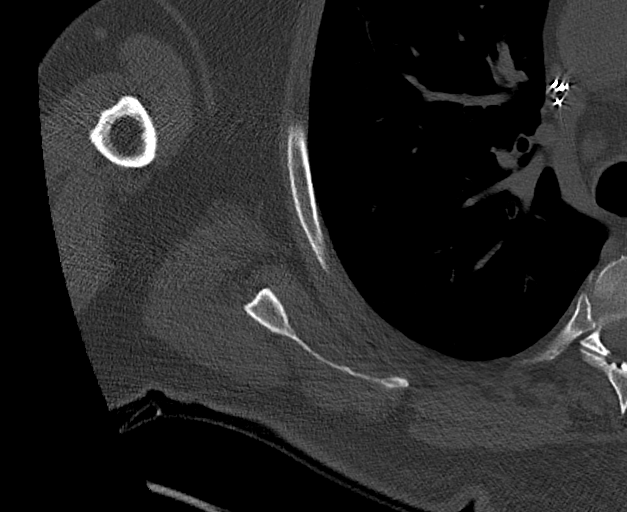
[im 69/103  bone]
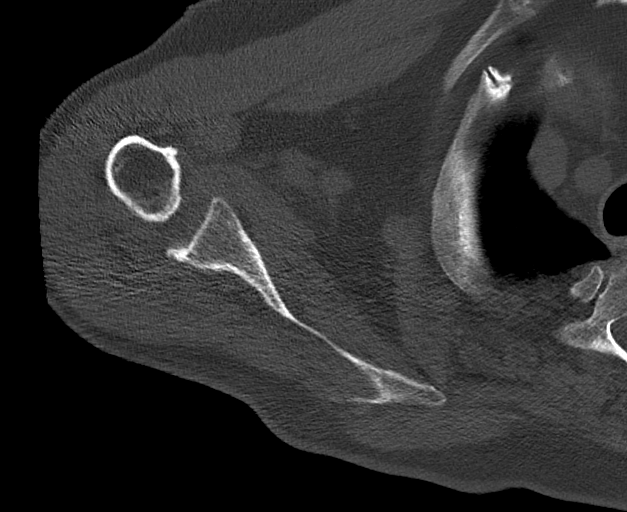

[Series 24: (id) st djo matchpoint 2.00 sag · sagittal · 0.39mm/px · 6 of 119 slices shown]
[im 20/119  bone]
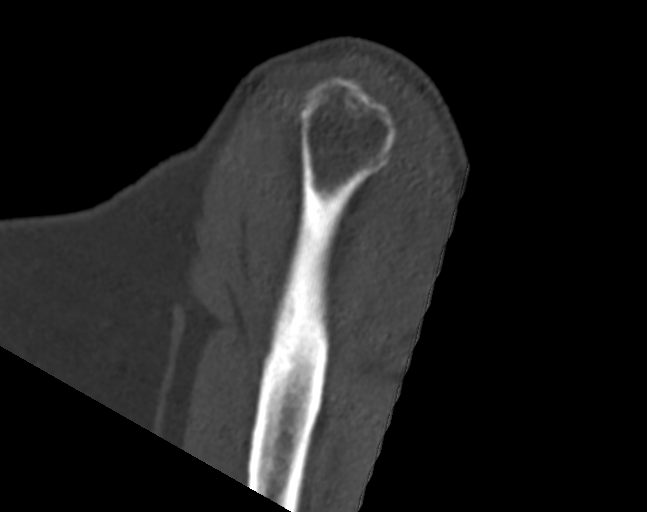
[im 40/119  bone]
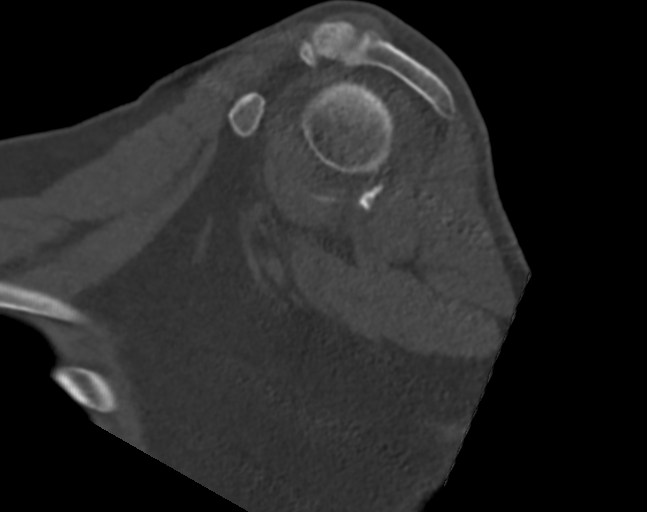
[im 48/119  soft-tissue]
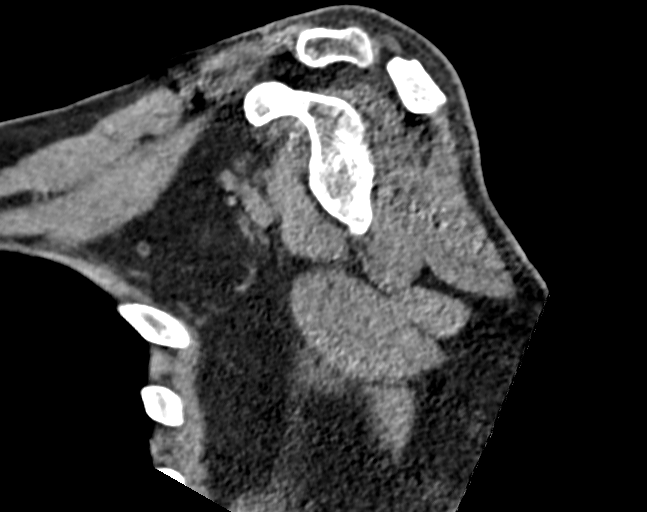
[im 60/119  bone]
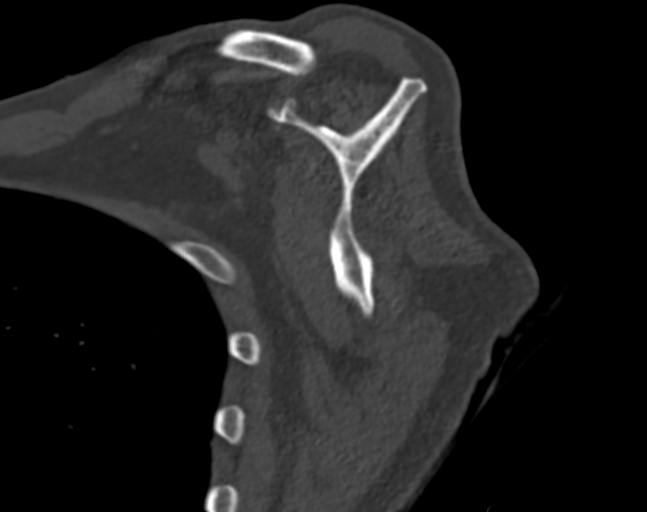
[im 79/119  bone]
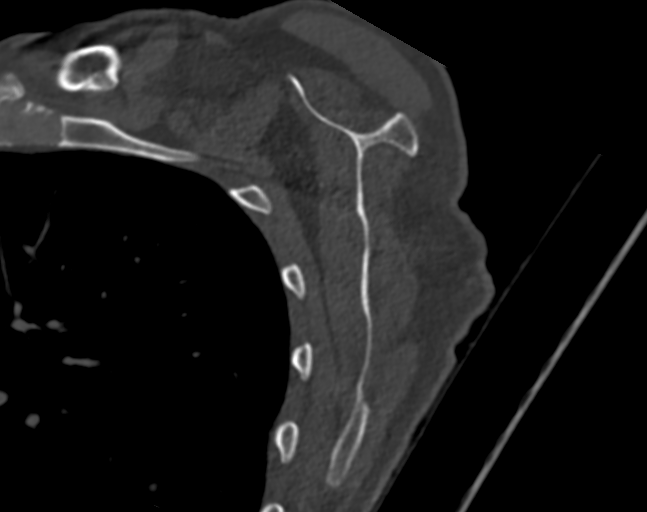
[im 99/119  bone]
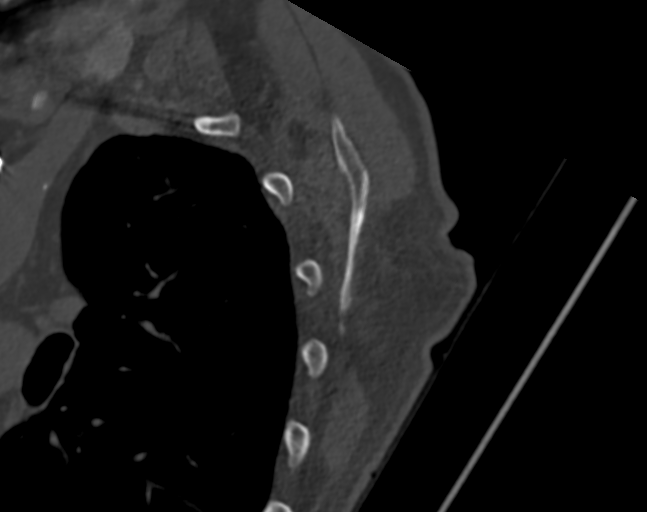

[10 of 29 positions shown; findings below may reference images not displayed]

FINDINGS: Bones/Joint/Cartilage

No fracture or dislocation. Normal alignment. No joint effusion.

Loss of the normal acromiohumeral distance as can be seen with a
supraspinatus tendon tear. Mild glenohumeral joint space narrowing.

No aggressive osseous lesion. Mild arthropathy of the
acromioclavicular joint.

Ligaments

Ligaments are suboptimally evaluated by CT.

Muscles and Tendons
Mild atrophy of the supraspinatus muscle. Remainder the muscles
demonstrate no focal abnormality.

Soft tissue
No fluid collection or hematoma. No soft tissue mass. Hazy
ground-glass opacity in the posterior right lower lobe which may be
secondary to an infectious or inflammatory etiology.
IMPRESSION: 1.  No acute osseous injury of the right shoulder.
2. Loss of the normal acromiohumeral distance as can be seen with a
supraspinatus tendon tear. Mild glenohumeral joint space narrowing.
3. Hazy ground-glass opacity in the posterior right lower lobe which
may be secondary to an infectious or inflammatory etiology.

## 2020-05-18 DIAGNOSIS — H401112 Primary open-angle glaucoma, right eye, moderate stage: Secondary | ICD-10-CM | POA: Diagnosis not present

## 2020-05-28 DIAGNOSIS — S93401A Sprain of unspecified ligament of right ankle, initial encounter: Secondary | ICD-10-CM | POA: Diagnosis not present

## 2020-06-08 DIAGNOSIS — Z7901 Long term (current) use of anticoagulants: Secondary | ICD-10-CM | POA: Diagnosis not present

## 2020-06-08 DIAGNOSIS — N1831 Chronic kidney disease, stage 3a: Secondary | ICD-10-CM | POA: Diagnosis not present

## 2020-06-08 DIAGNOSIS — I10 Essential (primary) hypertension: Secondary | ICD-10-CM | POA: Diagnosis not present

## 2020-06-08 DIAGNOSIS — E039 Hypothyroidism, unspecified: Secondary | ICD-10-CM | POA: Diagnosis not present

## 2020-06-08 DIAGNOSIS — E782 Mixed hyperlipidemia: Secondary | ICD-10-CM | POA: Diagnosis not present

## 2020-06-08 DIAGNOSIS — I480 Paroxysmal atrial fibrillation: Secondary | ICD-10-CM | POA: Diagnosis not present

## 2020-06-08 DIAGNOSIS — Z79899 Other long term (current) drug therapy: Secondary | ICD-10-CM | POA: Diagnosis not present

## 2020-06-10 DIAGNOSIS — S93401A Sprain of unspecified ligament of right ankle, initial encounter: Secondary | ICD-10-CM | POA: Diagnosis not present

## 2020-06-15 DIAGNOSIS — I10 Essential (primary) hypertension: Secondary | ICD-10-CM | POA: Diagnosis not present

## 2020-06-15 DIAGNOSIS — I480 Paroxysmal atrial fibrillation: Secondary | ICD-10-CM | POA: Diagnosis not present

## 2020-06-15 DIAGNOSIS — Z7901 Long term (current) use of anticoagulants: Secondary | ICD-10-CM | POA: Diagnosis not present

## 2020-06-15 DIAGNOSIS — E039 Hypothyroidism, unspecified: Secondary | ICD-10-CM | POA: Diagnosis not present

## 2020-06-15 DIAGNOSIS — Z0000 Encounter for general adult medical examination without abnormal findings: Secondary | ICD-10-CM | POA: Diagnosis not present

## 2020-06-15 DIAGNOSIS — E785 Hyperlipidemia, unspecified: Secondary | ICD-10-CM | POA: Diagnosis not present

## 2020-06-15 DIAGNOSIS — I2510 Atherosclerotic heart disease of native coronary artery without angina pectoris: Secondary | ICD-10-CM | POA: Diagnosis not present

## 2020-06-15 DIAGNOSIS — I2699 Other pulmonary embolism without acute cor pulmonale: Secondary | ICD-10-CM | POA: Diagnosis not present

## 2020-06-15 DIAGNOSIS — Z125 Encounter for screening for malignant neoplasm of prostate: Secondary | ICD-10-CM | POA: Diagnosis not present

## 2020-07-01 DIAGNOSIS — Z95810 Presence of automatic (implantable) cardiac defibrillator: Secondary | ICD-10-CM | POA: Diagnosis not present

## 2020-07-01 DIAGNOSIS — I10 Essential (primary) hypertension: Secondary | ICD-10-CM | POA: Diagnosis not present

## 2020-07-01 DIAGNOSIS — I2510 Atherosclerotic heart disease of native coronary artery without angina pectoris: Secondary | ICD-10-CM | POA: Diagnosis not present

## 2020-07-01 DIAGNOSIS — I5022 Chronic systolic (congestive) heart failure: Secondary | ICD-10-CM | POA: Diagnosis not present

## 2020-07-01 DIAGNOSIS — I480 Paroxysmal atrial fibrillation: Secondary | ICD-10-CM | POA: Diagnosis not present

## 2020-07-01 DIAGNOSIS — I2699 Other pulmonary embolism without acute cor pulmonale: Secondary | ICD-10-CM | POA: Diagnosis not present

## 2020-07-01 DIAGNOSIS — Z7901 Long term (current) use of anticoagulants: Secondary | ICD-10-CM | POA: Diagnosis not present

## 2020-07-01 DIAGNOSIS — Z951 Presence of aortocoronary bypass graft: Secondary | ICD-10-CM | POA: Diagnosis not present

## 2020-07-14 DIAGNOSIS — I480 Paroxysmal atrial fibrillation: Secondary | ICD-10-CM | POA: Diagnosis not present

## 2020-10-07 DIAGNOSIS — Z95810 Presence of automatic (implantable) cardiac defibrillator: Secondary | ICD-10-CM | POA: Diagnosis not present

## 2020-11-16 DIAGNOSIS — H401112 Primary open-angle glaucoma, right eye, moderate stage: Secondary | ICD-10-CM | POA: Diagnosis not present

## 2020-12-08 DIAGNOSIS — E039 Hypothyroidism, unspecified: Secondary | ICD-10-CM | POA: Diagnosis not present

## 2020-12-08 DIAGNOSIS — E785 Hyperlipidemia, unspecified: Secondary | ICD-10-CM | POA: Diagnosis not present

## 2020-12-08 DIAGNOSIS — I10 Essential (primary) hypertension: Secondary | ICD-10-CM | POA: Diagnosis not present

## 2020-12-08 DIAGNOSIS — Z125 Encounter for screening for malignant neoplasm of prostate: Secondary | ICD-10-CM | POA: Diagnosis not present

## 2020-12-08 DIAGNOSIS — I480 Paroxysmal atrial fibrillation: Secondary | ICD-10-CM | POA: Diagnosis not present

## 2020-12-15 DIAGNOSIS — E785 Hyperlipidemia, unspecified: Secondary | ICD-10-CM | POA: Diagnosis not present

## 2020-12-15 DIAGNOSIS — I10 Essential (primary) hypertension: Secondary | ICD-10-CM | POA: Diagnosis not present

## 2020-12-15 DIAGNOSIS — N1831 Chronic kidney disease, stage 3a: Secondary | ICD-10-CM | POA: Diagnosis not present

## 2020-12-15 DIAGNOSIS — Z7901 Long term (current) use of anticoagulants: Secondary | ICD-10-CM | POA: Diagnosis not present

## 2020-12-15 DIAGNOSIS — E039 Hypothyroidism, unspecified: Secondary | ICD-10-CM | POA: Diagnosis not present

## 2020-12-15 DIAGNOSIS — I480 Paroxysmal atrial fibrillation: Secondary | ICD-10-CM | POA: Diagnosis not present

## 2020-12-15 DIAGNOSIS — Z125 Encounter for screening for malignant neoplasm of prostate: Secondary | ICD-10-CM | POA: Diagnosis not present

## 2020-12-15 DIAGNOSIS — R972 Elevated prostate specific antigen [PSA]: Secondary | ICD-10-CM | POA: Diagnosis not present

## 2020-12-15 DIAGNOSIS — I2510 Atherosclerotic heart disease of native coronary artery without angina pectoris: Secondary | ICD-10-CM | POA: Diagnosis not present

## 2021-01-06 DIAGNOSIS — Z23 Encounter for immunization: Secondary | ICD-10-CM | POA: Diagnosis not present

## 2021-01-12 DIAGNOSIS — I5022 Chronic systolic (congestive) heart failure: Secondary | ICD-10-CM | POA: Diagnosis not present

## 2021-04-13 DIAGNOSIS — I420 Dilated cardiomyopathy: Secondary | ICD-10-CM | POA: Diagnosis not present

## 2021-05-18 DIAGNOSIS — H401112 Primary open-angle glaucoma, right eye, moderate stage: Secondary | ICD-10-CM | POA: Diagnosis not present

## 2021-06-07 DIAGNOSIS — I10 Essential (primary) hypertension: Secondary | ICD-10-CM | POA: Diagnosis not present

## 2021-06-07 DIAGNOSIS — Z7901 Long term (current) use of anticoagulants: Secondary | ICD-10-CM | POA: Diagnosis not present

## 2021-06-07 DIAGNOSIS — E785 Hyperlipidemia, unspecified: Secondary | ICD-10-CM | POA: Diagnosis not present

## 2021-06-07 DIAGNOSIS — I480 Paroxysmal atrial fibrillation: Secondary | ICD-10-CM | POA: Diagnosis not present

## 2021-06-07 DIAGNOSIS — E039 Hypothyroidism, unspecified: Secondary | ICD-10-CM | POA: Diagnosis not present

## 2021-06-07 DIAGNOSIS — I2510 Atherosclerotic heart disease of native coronary artery without angina pectoris: Secondary | ICD-10-CM | POA: Diagnosis not present

## 2021-06-07 DIAGNOSIS — Z125 Encounter for screening for malignant neoplasm of prostate: Secondary | ICD-10-CM | POA: Diagnosis not present

## 2021-06-08 DIAGNOSIS — K219 Gastro-esophageal reflux disease without esophagitis: Secondary | ICD-10-CM | POA: Diagnosis not present

## 2021-06-08 DIAGNOSIS — R490 Dysphonia: Secondary | ICD-10-CM | POA: Diagnosis not present

## 2021-06-14 DIAGNOSIS — E039 Hypothyroidism, unspecified: Secondary | ICD-10-CM | POA: Diagnosis not present

## 2021-06-14 DIAGNOSIS — D649 Anemia, unspecified: Secondary | ICD-10-CM | POA: Diagnosis not present

## 2021-06-14 DIAGNOSIS — I10 Essential (primary) hypertension: Secondary | ICD-10-CM | POA: Diagnosis not present

## 2021-06-14 DIAGNOSIS — Z95810 Presence of automatic (implantable) cardiac defibrillator: Secondary | ICD-10-CM | POA: Diagnosis not present

## 2021-06-14 DIAGNOSIS — N1831 Chronic kidney disease, stage 3a: Secondary | ICD-10-CM | POA: Diagnosis not present

## 2021-06-14 DIAGNOSIS — I480 Paroxysmal atrial fibrillation: Secondary | ICD-10-CM | POA: Diagnosis not present

## 2021-06-14 DIAGNOSIS — I2699 Other pulmonary embolism without acute cor pulmonale: Secondary | ICD-10-CM | POA: Diagnosis not present

## 2021-06-14 DIAGNOSIS — E538 Deficiency of other specified B group vitamins: Secondary | ICD-10-CM | POA: Diagnosis not present

## 2021-06-14 DIAGNOSIS — Z79899 Other long term (current) drug therapy: Secondary | ICD-10-CM | POA: Diagnosis not present

## 2021-06-14 DIAGNOSIS — Z0000 Encounter for general adult medical examination without abnormal findings: Secondary | ICD-10-CM | POA: Diagnosis not present

## 2021-06-14 DIAGNOSIS — I2510 Atherosclerotic heart disease of native coronary artery without angina pectoris: Secondary | ICD-10-CM | POA: Diagnosis not present

## 2021-06-14 DIAGNOSIS — E785 Hyperlipidemia, unspecified: Secondary | ICD-10-CM | POA: Diagnosis not present

## 2021-06-22 DIAGNOSIS — N1831 Chronic kidney disease, stage 3a: Secondary | ICD-10-CM | POA: Diagnosis not present

## 2021-06-22 DIAGNOSIS — D649 Anemia, unspecified: Secondary | ICD-10-CM | POA: Diagnosis not present

## 2021-06-22 DIAGNOSIS — E538 Deficiency of other specified B group vitamins: Secondary | ICD-10-CM | POA: Diagnosis not present

## 2021-06-30 DIAGNOSIS — I480 Paroxysmal atrial fibrillation: Secondary | ICD-10-CM | POA: Diagnosis not present

## 2021-06-30 DIAGNOSIS — I10 Essential (primary) hypertension: Secondary | ICD-10-CM | POA: Diagnosis not present

## 2021-06-30 DIAGNOSIS — I5022 Chronic systolic (congestive) heart failure: Secondary | ICD-10-CM | POA: Diagnosis not present

## 2021-06-30 DIAGNOSIS — R42 Dizziness and giddiness: Secondary | ICD-10-CM | POA: Diagnosis not present

## 2021-06-30 DIAGNOSIS — I2510 Atherosclerotic heart disease of native coronary artery without angina pectoris: Secondary | ICD-10-CM | POA: Diagnosis not present

## 2021-07-15 DIAGNOSIS — I2510 Atherosclerotic heart disease of native coronary artery without angina pectoris: Secondary | ICD-10-CM | POA: Diagnosis not present

## 2021-07-15 DIAGNOSIS — I480 Paroxysmal atrial fibrillation: Secondary | ICD-10-CM | POA: Diagnosis not present

## 2021-07-15 DIAGNOSIS — I5022 Chronic systolic (congestive) heart failure: Secondary | ICD-10-CM | POA: Diagnosis not present

## 2021-07-19 ENCOUNTER — Other Ambulatory Visit
Admission: RE | Admit: 2021-07-19 | Discharge: 2021-07-19 | Disposition: A | Discharge status: Home / Self Care | Payer: PPO | Source: Ambulatory Visit | Attending: Cardiology | Admitting: Cardiology

## 2021-07-19 DIAGNOSIS — I2510 Atherosclerotic heart disease of native coronary artery without angina pectoris: Secondary | ICD-10-CM | POA: Insufficient documentation

## 2021-07-19 DIAGNOSIS — Z7901 Long term (current) use of anticoagulants: Secondary | ICD-10-CM | POA: Diagnosis not present

## 2021-07-19 DIAGNOSIS — I5022 Chronic systolic (congestive) heart failure: Secondary | ICD-10-CM | POA: Insufficient documentation

## 2021-07-19 DIAGNOSIS — I10 Essential (primary) hypertension: Secondary | ICD-10-CM | POA: Diagnosis not present

## 2021-07-19 DIAGNOSIS — I480 Paroxysmal atrial fibrillation: Secondary | ICD-10-CM | POA: Insufficient documentation

## 2021-07-19 LAB — BRAIN NATRIURETIC PEPTIDE: B Natriuretic Peptide: 123.4 pg/mL — ABNORMAL HIGH (ref 0.0–100.0)

## 2021-07-26 DIAGNOSIS — I5022 Chronic systolic (congestive) heart failure: Secondary | ICD-10-CM | POA: Diagnosis not present

## 2021-07-26 DIAGNOSIS — Z7901 Long term (current) use of anticoagulants: Secondary | ICD-10-CM | POA: Diagnosis not present

## 2021-07-26 DIAGNOSIS — I10 Essential (primary) hypertension: Secondary | ICD-10-CM | POA: Diagnosis not present

## 2021-07-26 DIAGNOSIS — Z95810 Presence of automatic (implantable) cardiac defibrillator: Secondary | ICD-10-CM | POA: Diagnosis not present

## 2021-07-26 DIAGNOSIS — I2510 Atherosclerotic heart disease of native coronary artery without angina pectoris: Secondary | ICD-10-CM | POA: Diagnosis not present

## 2021-07-26 DIAGNOSIS — I480 Paroxysmal atrial fibrillation: Secondary | ICD-10-CM | POA: Diagnosis not present

## 2021-08-06 DIAGNOSIS — D649 Anemia, unspecified: Secondary | ICD-10-CM | POA: Diagnosis not present

## 2021-08-06 DIAGNOSIS — I480 Paroxysmal atrial fibrillation: Secondary | ICD-10-CM | POA: Diagnosis not present

## 2021-08-06 DIAGNOSIS — R5383 Other fatigue: Secondary | ICD-10-CM | POA: Diagnosis not present

## 2021-08-06 DIAGNOSIS — Z951 Presence of aortocoronary bypass graft: Secondary | ICD-10-CM | POA: Diagnosis not present

## 2021-08-06 DIAGNOSIS — Z95810 Presence of automatic (implantable) cardiac defibrillator: Secondary | ICD-10-CM | POA: Diagnosis not present

## 2021-08-06 DIAGNOSIS — I10 Essential (primary) hypertension: Secondary | ICD-10-CM | POA: Diagnosis not present

## 2021-08-06 DIAGNOSIS — I2510 Atherosclerotic heart disease of native coronary artery without angina pectoris: Secondary | ICD-10-CM | POA: Diagnosis not present

## 2021-08-17 DIAGNOSIS — Z8719 Personal history of other diseases of the digestive system: Secondary | ICD-10-CM | POA: Diagnosis not present

## 2021-08-17 DIAGNOSIS — R195 Other fecal abnormalities: Secondary | ICD-10-CM | POA: Diagnosis not present

## 2021-08-17 DIAGNOSIS — D509 Iron deficiency anemia, unspecified: Secondary | ICD-10-CM | POA: Diagnosis not present

## 2021-08-17 DIAGNOSIS — Z9889 Other specified postprocedural states: Secondary | ICD-10-CM | POA: Diagnosis not present

## 2021-08-17 DIAGNOSIS — Z95810 Presence of automatic (implantable) cardiac defibrillator: Secondary | ICD-10-CM | POA: Diagnosis not present

## 2021-08-17 DIAGNOSIS — Z86010 Personal history of colonic polyps: Secondary | ICD-10-CM | POA: Diagnosis not present

## 2021-08-17 DIAGNOSIS — R1319 Other dysphagia: Secondary | ICD-10-CM | POA: Diagnosis not present

## 2021-08-17 DIAGNOSIS — Z79899 Other long term (current) drug therapy: Secondary | ICD-10-CM | POA: Diagnosis not present

## 2021-08-17 DIAGNOSIS — Z7901 Long term (current) use of anticoagulants: Secondary | ICD-10-CM | POA: Diagnosis not present

## 2021-08-17 DIAGNOSIS — K625 Hemorrhage of anus and rectum: Secondary | ICD-10-CM | POA: Diagnosis not present

## 2021-08-17 DIAGNOSIS — R194 Change in bowel habit: Secondary | ICD-10-CM | POA: Diagnosis not present

## 2021-08-17 DIAGNOSIS — R531 Weakness: Secondary | ICD-10-CM | POA: Diagnosis not present

## 2021-08-25 DIAGNOSIS — Z95810 Presence of automatic (implantable) cardiac defibrillator: Secondary | ICD-10-CM | POA: Diagnosis not present

## 2021-08-25 DIAGNOSIS — I2510 Atherosclerotic heart disease of native coronary artery without angina pectoris: Secondary | ICD-10-CM | POA: Diagnosis not present

## 2021-08-25 DIAGNOSIS — I10 Essential (primary) hypertension: Secondary | ICD-10-CM | POA: Diagnosis not present

## 2021-08-25 DIAGNOSIS — I480 Paroxysmal atrial fibrillation: Secondary | ICD-10-CM | POA: Diagnosis not present

## 2021-08-26 ENCOUNTER — Encounter: Payer: Self-pay | Admitting: Gastroenterology

## 2021-08-27 ENCOUNTER — Ambulatory Visit
Admission: RE | Admit: 2021-08-27 | Discharge: 2021-08-27 | Disposition: A | Discharge status: Home / Self Care | Payer: PPO | Attending: Gastroenterology | Admitting: Gastroenterology

## 2021-08-27 ENCOUNTER — Ambulatory Visit: Payer: PPO | Admitting: Anesthesiology

## 2021-08-27 ENCOUNTER — Encounter: Payer: Self-pay | Admitting: Gastroenterology

## 2021-08-27 ENCOUNTER — Encounter
Admission: RE | Disposition: A | Discharge status: Home / Self Care | Payer: Self-pay | Source: Home / Self Care | Attending: Gastroenterology

## 2021-08-27 DIAGNOSIS — K319 Disease of stomach and duodenum, unspecified: Secondary | ICD-10-CM | POA: Insufficient documentation

## 2021-08-27 DIAGNOSIS — I2510 Atherosclerotic heart disease of native coronary artery without angina pectoris: Secondary | ICD-10-CM | POA: Insufficient documentation

## 2021-08-27 DIAGNOSIS — K2970 Gastritis, unspecified, without bleeding: Secondary | ICD-10-CM | POA: Insufficient documentation

## 2021-08-27 DIAGNOSIS — K921 Melena: Secondary | ICD-10-CM | POA: Insufficient documentation

## 2021-08-27 DIAGNOSIS — E785 Hyperlipidemia, unspecified: Secondary | ICD-10-CM | POA: Diagnosis not present

## 2021-08-27 DIAGNOSIS — R1310 Dysphagia, unspecified: Secondary | ICD-10-CM | POA: Diagnosis not present

## 2021-08-27 DIAGNOSIS — Z6830 Body mass index (BMI) 30.0-30.9, adult: Secondary | ICD-10-CM | POA: Insufficient documentation

## 2021-08-27 DIAGNOSIS — K222 Esophageal obstruction: Secondary | ICD-10-CM | POA: Insufficient documentation

## 2021-08-27 DIAGNOSIS — K449 Diaphragmatic hernia without obstruction or gangrene: Secondary | ICD-10-CM | POA: Diagnosis not present

## 2021-08-27 DIAGNOSIS — K5730 Diverticulosis of large intestine without perforation or abscess without bleeding: Secondary | ICD-10-CM | POA: Diagnosis not present

## 2021-08-27 DIAGNOSIS — I739 Peripheral vascular disease, unspecified: Secondary | ICD-10-CM | POA: Insufficient documentation

## 2021-08-27 DIAGNOSIS — K3189 Other diseases of stomach and duodenum: Secondary | ICD-10-CM | POA: Diagnosis not present

## 2021-08-27 DIAGNOSIS — D124 Benign neoplasm of descending colon: Secondary | ICD-10-CM | POA: Diagnosis not present

## 2021-08-27 DIAGNOSIS — I252 Old myocardial infarction: Secondary | ICD-10-CM | POA: Insufficient documentation

## 2021-08-27 DIAGNOSIS — Z1211 Encounter for screening for malignant neoplasm of colon: Secondary | ICD-10-CM | POA: Diagnosis not present

## 2021-08-27 DIAGNOSIS — R195 Other fecal abnormalities: Secondary | ICD-10-CM | POA: Diagnosis not present

## 2021-08-27 DIAGNOSIS — D509 Iron deficiency anemia, unspecified: Secondary | ICD-10-CM | POA: Insufficient documentation

## 2021-08-27 DIAGNOSIS — E669 Obesity, unspecified: Secondary | ICD-10-CM | POA: Diagnosis not present

## 2021-08-27 DIAGNOSIS — K649 Unspecified hemorrhoids: Secondary | ICD-10-CM | POA: Diagnosis not present

## 2021-08-27 DIAGNOSIS — I509 Heart failure, unspecified: Secondary | ICD-10-CM | POA: Insufficient documentation

## 2021-08-27 DIAGNOSIS — Z86711 Personal history of pulmonary embolism: Secondary | ICD-10-CM | POA: Diagnosis not present

## 2021-08-27 DIAGNOSIS — D122 Benign neoplasm of ascending colon: Secondary | ICD-10-CM | POA: Diagnosis not present

## 2021-08-27 DIAGNOSIS — K635 Polyp of colon: Secondary | ICD-10-CM | POA: Diagnosis not present

## 2021-08-27 DIAGNOSIS — K648 Other hemorrhoids: Secondary | ICD-10-CM | POA: Insufficient documentation

## 2021-08-27 DIAGNOSIS — Z95810 Presence of automatic (implantable) cardiac defibrillator: Secondary | ICD-10-CM | POA: Diagnosis not present

## 2021-08-27 DIAGNOSIS — Z8673 Personal history of transient ischemic attack (TIA), and cerebral infarction without residual deficits: Secondary | ICD-10-CM | POA: Insufficient documentation

## 2021-08-27 DIAGNOSIS — I480 Paroxysmal atrial fibrillation: Secondary | ICD-10-CM | POA: Diagnosis not present

## 2021-08-27 DIAGNOSIS — D120 Benign neoplasm of cecum: Secondary | ICD-10-CM | POA: Insufficient documentation

## 2021-08-27 DIAGNOSIS — I130 Hypertensive heart and chronic kidney disease with heart failure and stage 1 through stage 4 chronic kidney disease, or unspecified chronic kidney disease: Secondary | ICD-10-CM | POA: Insufficient documentation

## 2021-08-27 DIAGNOSIS — Z7901 Long term (current) use of anticoagulants: Secondary | ICD-10-CM | POA: Insufficient documentation

## 2021-08-27 DIAGNOSIS — D123 Benign neoplasm of transverse colon: Secondary | ICD-10-CM | POA: Diagnosis not present

## 2021-08-27 DIAGNOSIS — N183 Chronic kidney disease, stage 3 unspecified: Secondary | ICD-10-CM | POA: Insufficient documentation

## 2021-08-27 DIAGNOSIS — Z86010 Personal history of colonic polyps: Secondary | ICD-10-CM | POA: Diagnosis not present

## 2021-08-27 HISTORY — PX: ESOPHAGOGASTRODUODENOSCOPY: SHX5428

## 2021-08-27 HISTORY — PX: COLONOSCOPY: SHX5424

## 2021-08-27 SURGERY — COLONOSCOPY
Anesthesia: General

## 2021-08-27 MED ORDER — PROPOFOL 500 MG/50ML IV EMUL
INTRAVENOUS | Status: AC
  Filled 2021-08-27: qty 50

## 2021-08-27 MED ORDER — PROPOFOL 10 MG/ML IV BOLUS
INTRAVENOUS | Status: DC | PRN
Start: 2021-08-27 — End: 2021-08-27
  Administered 2021-08-27: 40 mg via INTRAVENOUS

## 2021-08-27 MED ORDER — LIDOCAINE HCL (CARDIAC) PF 100 MG/5ML IV SOSY
PREFILLED_SYRINGE | INTRAVENOUS | Status: DC | PRN
  Administered 2021-08-27: 50 mg via INTRAVENOUS

## 2021-08-27 MED ORDER — PROPOFOL 500 MG/50ML IV EMUL
INTRAVENOUS | Status: DC | PRN
  Administered 2021-08-27: 100 ug/kg/min via INTRAVENOUS

## 2021-08-27 MED ORDER — SODIUM CHLORIDE 0.9 % IV SOLN: INTRAVENOUS | Status: DC

## 2021-08-27 NOTE — Transfer of Care (Signed)
Immediate Anesthesia Transfer of Care Note  Patient: Jerry English  Procedure(s) Performed: COLONOSCOPY ESOPHAGOGASTRODUODENOSCOPY (EGD)  Patient Location: PACU and Endoscopy Unit  Anesthesia Type:General  Level of Consciousness: awake and patient cooperative  Airway & Oxygen Therapy: Patient Spontanous Breathing  Post-op Assessment: Report given to RN and Post -op Vital signs reviewed and stable  Post vital signs: Reviewed and stable  Last Vitals:  Vitals Value Taken Time  BP 92/71 08/27/21 1149  Temp 35.9 C 08/27/21 1148  Pulse 60 08/27/21 1150  Resp 18 08/27/21 1150  SpO2 96 % 08/27/21 1150  Vitals shown include unvalidated device data.  Last Pain:  Vitals:   08/27/21 1148  TempSrc: Temporal  PainSc: Asleep         Complications: No notable events documented.
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
## 2021-08-27 NOTE — Anesthesia Procedure Notes (Signed)
Procedure Name: MAC Date/Time: 08/27/2021 10:35 AM Performed by: Jerrye Noble, CRNA Pre-anesthesia Checklist: Emergency Drugs available, Patient identified, Suction available and Patient being monitored Patient Re-evaluated:Patient Re-evaluated prior to induction Oxygen Delivery Method: Nasal cannula

## 2021-08-27 NOTE — Op Note (Addendum)
[facility] Patient Name: Jerry English Procedure Date: 08/27/2021 10:32 AM MRN: [id number] Account #: [id number] Date of Birth: January 14, 1941 Admit Type: Outpatient Age: 81 Room: [facility] ENDO ROOM 1 Gender: Male Note Status: Supervisor Override Instrument Name: Jasper Riling [id number] Procedure:             Colonoscopy Indications:           Heme positive stool, Personal history of colonic                         polyps, Iron deficiency anemia, Change in bowel habits Providers:             Rueben Bash, DO Referring MD:          Adrian Prows (Referring MD) Medicines:             Monitored Anesthesia Care Complications:         No immediate complications. Estimated blood loss:                         Minimal. Procedure:             Pre-Anesthesia Assessment:                        - Prior to the procedure, a History and Physical was                         performed, and patient medications and allergies were                         reviewed. The patient is competent. The risks and                         benefits of the procedure and the sedation options and                         risks were discussed with the patient. All questions                         were answered and informed consent was obtained.                         Patient identification and proposed procedure were                         verified by the physician, the nurse, the anesthetist                         and the technician in the endoscopy suite. Mental                         Status Examination: alert and oriented. Airway                         Examination: normal oropharyngeal airway and neck                         mobility. Respiratory Examination: clear to  auscultation. CV Examination: irregularly irregular                         rate and rhythm. Prophylactic Antibiotics: The patient                         does not require prophylactic  antibiotics. Prior                         Anticoagulants: The patient has taken Xarelto                         (rivaroxaban), last dose was 4 days prior to                         procedure. ASA Grade Assessment: III - A patient with                         severe systemic disease. After reviewing the risks and                         benefits, the patient was deemed in satisfactory                         condition to undergo the procedure. The anesthesia                         plan was to use monitored anesthesia care (MAC).                         Immediately prior to administration of medications,                         the patient was re-assessed for adequacy to receive                         sedatives. The heart rate, respiratory rate, oxygen                         saturations, blood pressure, adequacy of pulmonary                         ventilation, and response to care were monitored                         throughout the procedure. The physical status of the                         patient was re-assessed after the procedure.                        After obtaining informed consent, the colonoscope was                         passed under direct vision. Throughout the procedure,                         the patient's blood pressure, pulse, and oxygen  saturations were monitored continuously. The                         Colonoscope was introduced through the anus and                         advanced to the the terminal ileum, with                         identification of the appendiceal orifice and IC                         valve. The colonoscopy was performed without                         difficulty. The patient tolerated the procedure well.                         The quality of the bowel preparation was evaluated                         using the BBPS [facility] Bowel Preparation Scale) with                         scores of: Right Colon = 2 (minor  amount of residual                         staining, small fragments of stool and/or opaque                         liquid, but mucosa seen well), Transverse Colon = 3                         (entire mucosa seen well with no residual staining,                         small fragments of stool or opaque liquid) and Left                         Colon = 2 (minor amount of residual staining, small                         fragments of stool and/or opaque liquid, but mucosa                         seen well). The total BBPS score equals 7. The quality                         of the bowel preparation was good. The terminal ileum,                         ileocecal valve, appendiceal orifice, and rectum were                         photographed. Findings:      The perianal and digital rectal examinations were normal. Pertinent       negatives include normal sphincter  tone.      The terminal ileum appeared normal. Estimated blood loss: none.      Multiple small-mouthed diverticula were found in the left colon.       Estimated blood loss: none.      Non-bleeding internal hemorrhoids were found during retroflexion. The       hemorrhoids were large.      A 2 to 3 mm polyp was found in the appendiceal orifice. The polyp was       sessile. The polyp was removed with a jumbo cold forceps. Resection and       retrieval were complete. Estimated blood loss was minimal.      A 7 to 9 mm polyp was found in the cecum. The polyp was       semi-pedunculated. The polyp was removed with a cold snare. Resection       and retrieval were complete. To prevent bleeding after the polypectomy,       two hemostatic clips were successfully placed (MR conditional). There       was no bleeding at the end of the procedure. Estimated blood loss was       minimal.      A 1 to 2 mm polyp was found in the cecum. The polyp was sessile. The       polyp was removed with a jumbo cold forceps. Resection and retrieval       were  complete. Estimated blood loss was minimal.      Four sessile polyps were found in the descending colon (2) and ascending       colon (2). The polyps were 1 to 2 mm in size. These polyps were removed       with a jumbo cold forceps. Resection and retrieval were complete.       Estimated blood loss was minimal.      Five sessile polyps were found in the descending colon and transverse       colon (4). The polyps were 3 to 5 mm in size. These polyps were removed       with a cold snare. Resection and retrieval were complete. To prevent       bleeding after the polypectomy, one hemostatic clip was successfully       placed (MR conditional). There was no bleeding at the end of the       procedure. Clip on one of the transverse polypectomy sites Estimated       blood loss was minimal.      The exam was otherwise without abnormality on direct and retroflexion       views. Impression:            - The examined portion of the ileum was normal.                        - Diverticulosis in the left colon.                        - Non-bleeding internal hemorrhoids.                        - One 2 to 3 mm polyp at the appendiceal orifice,                         removed with a jumbo cold forceps. Resected and  retrieved.                        - One 7 to 9 mm polyp in the cecum, removed with a                         cold snare. Resected and retrieved. Clips (MR                         conditional) were placed.                        - One 1 to 2 mm polyp in the cecum, removed with a                         jumbo cold forceps. Resected and retrieved.                        - Four 1 to 2 mm polyps in the descending colon and in                         the ascending colon, removed with a jumbo cold                         forceps. Resected and retrieved.                        - Five 3 to 5 mm polyps in the descending colon and in                         the transverse colon,  removed with a cold snare.                         Resected and retrieved. Clip (MR conditional) was                         placed.                        - The examination was otherwise normal on direct and                         retroflexion views. Recommendation:        - Discharge patient to home.                        - Resume previous diet.                        - No aspirin, ibuprofen, naproxen, or other                         non-steroidal anti-inflammatory drugs for 5 days after                         polyp removal.                        - Resume Xarelto (rivaroxaban) at prior dose in 4  days. Refer to managing physician for further                         adjustment of therapy.                        - Continue present medications.                        - Repeat colonoscopy for surveillance based on                         pathology results.                        - The findings and recommendations were discussed with                         the patient.                        - Await pathology results. Procedure Code(s):     --- Professional ---                        [phone], Colonoscopy, flexible; with removal of                         tumor(s), polyp(s), or other lesion(s) by snare                         technique                        45380, 86, Colonoscopy, flexible; with biopsy, single                         or multiple Diagnosis Code(s):     --- Professional ---                        K64.8, Other hemorrhoids                        K63.5, Polyp of colon                        K92.1, Melena (includes Hematochezia)                        K57.30, Diverticulosis of large intestine without                         perforation or abscess without bleeding CPT copyright 2019 American Medical Association. All rights reserved. The codes documented in this report are preliminary and upon coder review may  be revised to meet current compliance  requirements. Attending Participation:      I personally performed the entire procedure. Volney American, DO Annamaria Helling DO, DO 08/27/2021 12:01:18 PM This report has been signed electronically. Number of Addenda: 0 Note Initiated On: 08/27/2021 10:32 AM Scope Withdrawal Time: 0 hours 38 minutes 49 seconds  Total Procedure Duration: 0 hours 47 minutes 9 seconds  Estimated Blood Loss:  Estimated blood loss was minimal.  [facility]

## 2021-08-27 NOTE — Interval H&P Note (Signed)
History and Physical Interval Note: Preprocedure H&P from 08/27/21  was reviewed and there was no interval change after seeing and examining the patient.  Written consent was obtained from the patient after discussion of risks, benefits, and alternatives. Patient has consented to proceed with Esophagogastroduodenoscopy and Colonoscopy with possible intervention   08/27/2021 10:30 AM  Jerry English  has presented today for surgery, with the diagnosis of Heme positive stool (R19.5) Iron deficiency anemia, unspecified iron deficiency anemia type (D50.9) Hx of adenomatous colonic polyps (Z86.010) S/P dilatation of esophageal stricture (Z98.890,Z87.19) Rectal bleeding (K62.5) Generalized weakness (R53.1) Change in bowel habits (R19.4) Esophageal dysphagia (R13.19).  The various methods of treatment have been discussed with the patient and family. After consideration of risks, benefits and other options for treatment, the patient has consented to  Procedure(s): COLONOSCOPY (N/A) ESOPHAGOGASTRODUODENOSCOPY (EGD) (N/A) as a surgical intervention.  The patient's history has been reviewed, patient examined, no change in status, stable for surgery.  I have reviewed the patient's chart and labs.  Questions were answered to the patient's satisfaction.     Annamaria Helling

## 2021-08-27 NOTE — Op Note (Signed)
[facility] Gastroenterology Patient Name: Jerry English Procedure Date: 08/27/2021 10:33 AM MRN: [id number] Account #: [id number] Date of Birth: 07-Jan-1941 Admit Type: Outpatient Age: 81 Room: [facility] ENDO ROOM 1 Gender: Male Note Status: Finalized Instrument Name: Upper Endoscope [id number] Procedure:             Upper GI endoscopy Indications:           Iron deficiency anemia, Dysphagia Providers:             Rueben Bash, DO Referring MD:          Adrian Prows (Referring MD) Medicines:             Monitored Anesthesia Care Complications:         No immediate complications. Estimated blood loss:                         Minimal. Procedure:             Pre-Anesthesia Assessment:                        - Prior to the procedure, a History and Physical was                         performed, and patient medications and allergies were                         reviewed. The patient is competent. The risks and                         benefits of the procedure and the sedation options and                         risks were discussed with the patient. All questions                         were answered and informed consent was obtained.                         Patient identification and proposed procedure were                         verified by the physician, the nurse, the anesthetist                         and the technician in the endoscopy suite. Mental                         Status Examination: alert and oriented. Airway                         Examination: normal oropharyngeal airway and neck                         mobility. Respiratory Examination: clear to                         auscultation. CV Examination: irregularly irregular  rate and rhythm. Prophylactic Antibiotics: The patient                         does not require prophylactic antibiotics. Prior                         Anticoagulants: The patient has taken Xarelto                          (rivaroxaban), last dose was 4 days prior to                         procedure. ASA Grade Assessment: III - A patient with                         severe systemic disease. After reviewing the risks and                         benefits, the patient was deemed in satisfactory                         condition to undergo the procedure. The anesthesia                         plan was to use monitored anesthesia care (MAC).                         Immediately prior to administration of medications,                         the patient was re-assessed for adequacy to receive                         sedatives. The heart rate, respiratory rate, oxygen                         saturations, blood pressure, adequacy of pulmonary                         ventilation, and response to care were monitored                         throughout the procedure. The physical status of the                         patient was re-assessed after the procedure.                        After obtaining informed consent, the endoscope was                         passed under direct vision. Throughout the procedure,                         the patient's blood pressure, pulse, and oxygen                         saturations were monitored continuously. The Endoscope  was introduced through the mouth, and advanced to the                         second part of duodenum. The upper GI endoscopy was                         accomplished without difficulty. The patient tolerated                         the procedure well. Findings:      The duodenal bulb, first portion of the duodenum and second portion of       the duodenum were normal. Biopsies for histology were taken with a cold       forceps for evaluation of celiac disease. Estimated blood loss was       minimal.      Localized mild inflammation characterized by erythema was found in the       gastric antrum. Biopsies were taken with a  cold forceps for Helicobacter       pylori testing. Estimated blood loss was minimal.      A small hiatal hernia was present. Estimated blood loss: none.      The exam of the stomach was otherwise normal.      The Z-line was regular. Estimated blood loss: none.      Esophagogastric landmarks were identified: the gastroesophageal junction       was found at 42 cm from the incisors.      A widely patent Schatzki ring was found at the gastroesophageal       junction. The scope was withdrawn. Dilation was performed with a Maloney       dilator with no resistance at 32 Fr and 54 Fr. The dilation site was       examined following endoscope reinsertion and showed mild mucosal       disruption. Estimated blood loss was minimal.      The exam of the esophagus was otherwise normal. Impression:            - Normal duodenal bulb, first portion of the duodenum                         and second portion of the duodenum. Biopsied.                        - Gastritis. Biopsied.                        - Small hiatal hernia.                        - Z-line regular.                        - Esophagogastric landmarks identified.                        - Widely patent Schatzki ring. Dilated. Recommendation:        - Discharge patient to home.                        - Soft diet today.                        -  Continue present medications.                        - No aspirin, ibuprofen, naproxen, or other                         non-steroidal anti-inflammatory drugs for 5 days.                        - Resume Xarelto (rivaroxaban) at prior dose in 3                         days. Refer to referring physician for further                         adjustment of therapy.                        - Await pathology results.                        - Return to GI clinic as previously scheduled.                        - proceed with colonoscopy                        - The findings and recommendations were discussed with                          the patient. Procedure Code(s):     --- Professional ---                        [phone], Esophagogastroduodenoscopy, flexible,                         transoral; with biopsy, single or multiple                        43450, Dilation of esophagus, by unguided sound or                         bougie, single or multiple passes Diagnosis Code(s):     --- Professional ---                        K29.70, Gastritis, unspecified, without bleeding                        K44.9, Diaphragmatic hernia without obstruction or                         gangrene                        K22.2, Esophageal obstruction                        D50.9, Iron deficiency anemia, unspecified                        R13.10, Dysphagia, unspecified CPT copyright 2019 American Medical Association. All rights  reserved. The codes documented in this report are preliminary and upon coder review may  be revised to meet current compliance requirements. Attending Participation:      I personally performed the entire procedure. Volney American, DO Annamaria Helling DO, DO 08/27/2021 10:55:38 AM This report has been signed electronically. Number of Addenda: 0 Note Initiated On: 08/27/2021 10:33 AM Estimated Blood Loss:  Estimated blood loss was minimal.      [facility]
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
## 2021-08-28 ENCOUNTER — Encounter: Payer: Self-pay | Admitting: Gastroenterology

## 2021-08-30 LAB — SURGICAL PATHOLOGY

## 2021-08-31 NOTE — Anesthesia Postprocedure Evaluation (Signed)
Anesthesia Post Note  Patient: Kostantinos Tallman Hargreaves  Procedure(s) Performed: COLONOSCOPY ESOPHAGOGASTRODUODENOSCOPY (EGD)  Patient location during evaluation: Endoscopy Anesthesia Type: General Level of consciousness: awake and alert Pain management: pain level controlled Vital Signs Assessment: post-procedure vital signs reviewed and stable Respiratory status: spontaneous breathing, nonlabored ventilation and respiratory function stable Cardiovascular status: blood pressure returned to baseline and stable Postop Assessment: no apparent nausea or vomiting Anesthetic complications: no   No notable events documented.   Last Vitals:  Vitals:   08/27/21 0959 08/27/21 1148  BP: (!) 132/91 92/71  Pulse: 76 61  Resp: 18 15  Temp: (!) 35.9 C (!) 35.9 C  SpO2: 98% 96%    Last Pain:  Vitals:   08/27/21 1218  TempSrc:   PainSc: 0-No pain                 [place] Ouch

## 2021-09-06 DIAGNOSIS — N1831 Chronic kidney disease, stage 3a: Secondary | ICD-10-CM | POA: Diagnosis not present

## 2021-09-06 DIAGNOSIS — Z7901 Long term (current) use of anticoagulants: Secondary | ICD-10-CM | POA: Diagnosis not present

## 2021-09-06 DIAGNOSIS — I5022 Chronic systolic (congestive) heart failure: Secondary | ICD-10-CM | POA: Diagnosis not present

## 2021-09-06 DIAGNOSIS — I2510 Atherosclerotic heart disease of native coronary artery without angina pectoris: Secondary | ICD-10-CM | POA: Diagnosis not present

## 2021-09-06 DIAGNOSIS — R195 Other fecal abnormalities: Secondary | ICD-10-CM | POA: Diagnosis not present

## 2021-09-06 DIAGNOSIS — I10 Essential (primary) hypertension: Secondary | ICD-10-CM | POA: Diagnosis not present

## 2021-09-06 DIAGNOSIS — E039 Hypothyroidism, unspecified: Secondary | ICD-10-CM | POA: Diagnosis not present

## 2021-09-06 DIAGNOSIS — I480 Paroxysmal atrial fibrillation: Secondary | ICD-10-CM | POA: Diagnosis not present

## 2021-09-21 DIAGNOSIS — I480 Paroxysmal atrial fibrillation: Secondary | ICD-10-CM | POA: Diagnosis not present

## 2021-10-06 DIAGNOSIS — I480 Paroxysmal atrial fibrillation: Secondary | ICD-10-CM | POA: Diagnosis not present

## 2021-10-06 DIAGNOSIS — I2510 Atherosclerotic heart disease of native coronary artery without angina pectoris: Secondary | ICD-10-CM | POA: Diagnosis not present

## 2021-10-06 DIAGNOSIS — T82198A Other mechanical complication of other cardiac electronic device, initial encounter: Secondary | ICD-10-CM | POA: Diagnosis not present

## 2021-10-06 DIAGNOSIS — I5022 Chronic systolic (congestive) heart failure: Secondary | ICD-10-CM | POA: Diagnosis not present

## 2021-10-06 DIAGNOSIS — I10 Essential (primary) hypertension: Secondary | ICD-10-CM | POA: Diagnosis not present

## 2021-11-15 DIAGNOSIS — H2513 Age-related nuclear cataract, bilateral: Secondary | ICD-10-CM | POA: Diagnosis not present

## 2021-11-15 DIAGNOSIS — H353131 Nonexudative age-related macular degeneration, bilateral, early dry stage: Secondary | ICD-10-CM | POA: Diagnosis not present

## 2021-11-15 DIAGNOSIS — H401112 Primary open-angle glaucoma, right eye, moderate stage: Secondary | ICD-10-CM | POA: Diagnosis not present

## 2021-12-04 ENCOUNTER — Other Ambulatory Visit: Payer: Self-pay

## 2021-12-04 ENCOUNTER — Emergency Department: Payer: PPO

## 2021-12-04 ENCOUNTER — Emergency Department
Admission: EM | Admit: 2021-12-04 | Discharge: 2021-12-04 | Disposition: A | Discharge status: Home / Self Care | Payer: PPO | Attending: Emergency Medicine | Admitting: Emergency Medicine

## 2021-12-04 DIAGNOSIS — S0990XA Unspecified injury of head, initial encounter: Secondary | ICD-10-CM | POA: Insufficient documentation

## 2021-12-04 DIAGNOSIS — G9389 Other specified disorders of brain: Secondary | ICD-10-CM | POA: Insufficient documentation

## 2021-12-04 DIAGNOSIS — I509 Heart failure, unspecified: Secondary | ICD-10-CM | POA: Insufficient documentation

## 2021-12-04 DIAGNOSIS — S80211A Abrasion, right knee, initial encounter: Secondary | ICD-10-CM | POA: Insufficient documentation

## 2021-12-04 DIAGNOSIS — I2510 Atherosclerotic heart disease of native coronary artery without angina pectoris: Secondary | ICD-10-CM | POA: Insufficient documentation

## 2021-12-04 DIAGNOSIS — Z950 Presence of cardiac pacemaker: Secondary | ICD-10-CM | POA: Insufficient documentation

## 2021-12-04 DIAGNOSIS — R079 Chest pain, unspecified: Secondary | ICD-10-CM | POA: Diagnosis not present

## 2021-12-04 DIAGNOSIS — W010XXA Fall on same level from slipping, tripping and stumbling without subsequent striking against object, initial encounter: Secondary | ICD-10-CM | POA: Diagnosis not present

## 2021-12-04 DIAGNOSIS — S4992XA Unspecified injury of left shoulder and upper arm, initial encounter: Secondary | ICD-10-CM | POA: Diagnosis not present

## 2021-12-04 DIAGNOSIS — I130 Hypertensive heart and chronic kidney disease with heart failure and stage 1 through stage 4 chronic kidney disease, or unspecified chronic kidney disease: Secondary | ICD-10-CM | POA: Insufficient documentation

## 2021-12-04 DIAGNOSIS — Z8673 Personal history of transient ischemic attack (TIA), and cerebral infarction without residual deficits: Secondary | ICD-10-CM | POA: Diagnosis not present

## 2021-12-04 DIAGNOSIS — Y9301 Activity, walking, marching and hiking: Secondary | ICD-10-CM | POA: Insufficient documentation

## 2021-12-04 DIAGNOSIS — S30811A Abrasion of abdominal wall, initial encounter: Secondary | ICD-10-CM | POA: Insufficient documentation

## 2021-12-04 DIAGNOSIS — N183 Chronic kidney disease, stage 3 unspecified: Secondary | ICD-10-CM | POA: Insufficient documentation

## 2021-12-04 DIAGNOSIS — I480 Paroxysmal atrial fibrillation: Secondary | ICD-10-CM | POA: Insufficient documentation

## 2021-12-04 DIAGNOSIS — Z043 Encounter for examination and observation following other accident: Secondary | ICD-10-CM | POA: Diagnosis not present

## 2021-12-04 DIAGNOSIS — M542 Cervicalgia: Secondary | ICD-10-CM | POA: Diagnosis not present

## 2021-12-04 DIAGNOSIS — S43402A Unspecified sprain of left shoulder joint, initial encounter: Secondary | ICD-10-CM | POA: Diagnosis not present

## 2021-12-04 DIAGNOSIS — R55 Syncope and collapse: Secondary | ICD-10-CM | POA: Diagnosis not present

## 2021-12-04 DIAGNOSIS — W19XXXA Unspecified fall, initial encounter: Secondary | ICD-10-CM

## 2021-12-04 MED ORDER — OXYCODONE HCL 5 MG PO TABS
5.0000 mg | ORAL_TABLET | Freq: Once | ORAL | Status: AC
  Administered 2021-12-04: 5 mg via ORAL
  Filled 2021-12-04: qty 1

## 2021-12-04 MED ORDER — PREDNISONE 50 MG PO TABS
50.0000 mg | ORAL_TABLET | Freq: Every day | ORAL | 0 refills | Status: AC
Start: 2021-12-04 — End: ?

## 2021-12-04 MED ORDER — PREDNISONE 50 MG PO TABS: 50.0000 mg | ORAL_TABLET | Freq: Every day | ORAL | 0 refills | Status: DC

## 2021-12-04 NOTE — Discharge Instructions (Addendum)
You can use the shoulder immobilizer for comfort but please make sure you are mobilizing your shoulder every day and only wear the immobilizer for 3 to 5 days to avoid causing a frozen shoulder.  You can take Tylenol for pain.  Please follow-up with your primary care provider.

## 2021-12-04 NOTE — ED Triage Notes (Signed)
Pt presents to ED with c/o of R shoulder pain after a trip and fall. Pt denies any prior surgeries to this area. Pt denies + LOC. NAD noted. Pt ambulatory to triage with steady gait.

## 2021-12-04 NOTE — ED Provider Notes (Signed)
-----------------------------------------   10:43 PM on 12/04/2021 -----------------------------------------  Blood pressure 133/88, pulse 67, temperature 98.3 F (36.8 C), temperature source Oral, resp. rate 17, SpO2 97 %.  Assuming care from Dr. Starleen Blue.  In short, Jerry English is a 81 y.o. male with a chief complaint of Fall and Arm Injury .  Refer to the original H&P for additional details.  The current plan of care is to await CT scans.  Patient fell onto the left shoulder, imaging is reassuring with no acute evidence of fracture.  Patient fell onto his shoulder with his arm close to his body so have a lower suspicion for rotator cuff tear.  Patient did hit his head is on blood thinners and so we ordered CT scans.  CTs reveal no acute traumatic findings the patient is stable for discharge at this time.  Sling is provided for symptom control, short course of steroid for inflammation control.  At this time I do recommend pendulum exercises to avoid frozen shoulder/adhesive capsulitis.  He has an orthopedic surgeon that performed the rotator cuff on his right shoulder a few years ago.  I recommended following up with orthopedics if conservative treatment does not improve patient's symptoms.  ED diagnosis:  Shoulder injury of left shoulder Fall.    Brynda Peon 12/04/21 2246    Rada Hay, MD 12/05/21 1057

## 2021-12-04 NOTE — ED Provider Triage Note (Signed)
Emergency Medicine Provider Triage Evaluation Note  Jerry English , a 81 y.o. male  was evaluated in triage.  Pt complains of left shoulder pain after falling this morning. Tripped over items in storage building.  No head injury or LOC.    Review of Systems  Positive: Positive humeral tenderness Negative: No prior shoulder or arm injury to left.  Physical Exam  There were no vitals taken for this visit. Gen:   Awake, no distress  Alert, talkative Resp:  Normal effort  MSK:   Left shoulder No point tenderness.  Tender proximal 1/3 shaft area.  Splinting arm against body.  No deformity, skin intact.  Other:    Medical Decision Making  Medically screening exam initiated at 5:56 PM.  Appropriate orders placed.  Jerry English was informed that the remainder of the evaluation will be completed by another provider, this initial triage assessment does not replace that evaluation, and the importance of remaining in the ED until their evaluation is complete.     Johnn Hai, PA-C 12/04/21 [phone]
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
## 2021-12-08 DIAGNOSIS — I2510 Atherosclerotic heart disease of native coronary artery without angina pectoris: Secondary | ICD-10-CM | POA: Diagnosis not present

## 2021-12-08 DIAGNOSIS — E785 Hyperlipidemia, unspecified: Secondary | ICD-10-CM | POA: Diagnosis not present

## 2021-12-08 DIAGNOSIS — E538 Deficiency of other specified B group vitamins: Secondary | ICD-10-CM | POA: Diagnosis not present

## 2021-12-08 DIAGNOSIS — Z79899 Other long term (current) drug therapy: Secondary | ICD-10-CM | POA: Diagnosis not present

## 2021-12-08 DIAGNOSIS — Z95810 Presence of automatic (implantable) cardiac defibrillator: Secondary | ICD-10-CM | POA: Diagnosis not present

## 2021-12-08 DIAGNOSIS — N1831 Chronic kidney disease, stage 3a: Secondary | ICD-10-CM | POA: Diagnosis not present

## 2021-12-08 DIAGNOSIS — I10 Essential (primary) hypertension: Secondary | ICD-10-CM | POA: Diagnosis not present

## 2021-12-08 DIAGNOSIS — E039 Hypothyroidism, unspecified: Secondary | ICD-10-CM | POA: Diagnosis not present

## 2021-12-08 DIAGNOSIS — I480 Paroxysmal atrial fibrillation: Secondary | ICD-10-CM | POA: Diagnosis not present

## 2021-12-08 DIAGNOSIS — D649 Anemia, unspecified: Secondary | ICD-10-CM | POA: Diagnosis not present

## 2021-12-08 DIAGNOSIS — Z0000 Encounter for general adult medical examination without abnormal findings: Secondary | ICD-10-CM | POA: Diagnosis not present

## 2021-12-21 DIAGNOSIS — E039 Hypothyroidism, unspecified: Secondary | ICD-10-CM | POA: Diagnosis not present

## 2021-12-21 DIAGNOSIS — N1831 Chronic kidney disease, stage 3a: Secondary | ICD-10-CM | POA: Diagnosis not present

## 2021-12-21 DIAGNOSIS — Z79899 Other long term (current) drug therapy: Secondary | ICD-10-CM | POA: Diagnosis not present

## 2021-12-21 DIAGNOSIS — I10 Essential (primary) hypertension: Secondary | ICD-10-CM | POA: Diagnosis not present

## 2021-12-21 DIAGNOSIS — E785 Hyperlipidemia, unspecified: Secondary | ICD-10-CM | POA: Diagnosis not present

## 2021-12-21 DIAGNOSIS — I2510 Atherosclerotic heart disease of native coronary artery without angina pectoris: Secondary | ICD-10-CM | POA: Diagnosis not present

## 2021-12-21 DIAGNOSIS — I480 Paroxysmal atrial fibrillation: Secondary | ICD-10-CM | POA: Diagnosis not present

## 2022-01-04 DIAGNOSIS — I420 Dilated cardiomyopathy: Secondary | ICD-10-CM | POA: Diagnosis not present

## 2022-01-05 ENCOUNTER — Ambulatory Visit (LOCAL_COMMUNITY_HEALTH_CENTER): Payer: PPO

## 2022-01-05 DIAGNOSIS — Z23 Encounter for immunization: Secondary | ICD-10-CM

## 2022-01-05 DIAGNOSIS — Z719 Counseling, unspecified: Secondary | ICD-10-CM

## 2022-01-05 NOTE — Progress Notes (Signed)
  Are you feeling sick today? No   Have you ever received a dose of COVID-19 Vaccine? [employer], [place], [place], [place], Other) Yes  If yes, which vaccine and how many doses?  [place], 4   Did you bring the vaccination record card or other documentation?  Yes   Do you have a health condition or are undergoing treatment that makes you moderately or severely immunocompromised? This would include, but not be limited to: cancer, HIV, organ transplant, immunosuppressive therapy/high-dose corticosteroids, or moderate/severe primary immunodeficiency.  No  Have you received COVID-19 vaccine before or during hematopoietic cell transplant (HCT) or CAR-T-cell therapies? No  Have you ever had an allergic reaction to: (This would include a severe allergic reaction or a reaction that caused hives, swelling, or respiratory distress, including wheezing.) A component of a COVID-19 vaccine or a previous dose of COVID-19 vaccine? No   Have you ever had an allergic reaction to another vaccine (other thanCOVID-19 vaccine) or an injectable medication? (This would include a severe allergic reaction or a reaction that caused hives, swelling, or respiratory distress, including wheezing.)   No    Do you have a history of any of the following:  Myocarditis or Pericarditis No  Dermal fillers:  No  Multisystem Inflammatory Syndrome (MIS-C or MIS-A)? No  COVID-19 disease within the past 3 months? No  Vaccinated with monkeypox vaccine in the last 4 weeks? No  Eligible and administered [place] 12+, 23-24, tolerated well. Verbalized understanding of VIS and NCIR copy. M.Shelly Shoultz, LPN.

## 2022-04-04 DIAGNOSIS — Z7901 Long term (current) use of anticoagulants: Secondary | ICD-10-CM | POA: Diagnosis not present

## 2022-04-04 DIAGNOSIS — I5022 Chronic systolic (congestive) heart failure: Secondary | ICD-10-CM | POA: Diagnosis not present

## 2022-04-04 DIAGNOSIS — I480 Paroxysmal atrial fibrillation: Secondary | ICD-10-CM | POA: Diagnosis not present

## 2022-04-11 DIAGNOSIS — I480 Paroxysmal atrial fibrillation: Secondary | ICD-10-CM | POA: Diagnosis not present

## 2022-04-11 DIAGNOSIS — Z7901 Long term (current) use of anticoagulants: Secondary | ICD-10-CM | POA: Diagnosis not present

## 2022-04-12 DIAGNOSIS — I420 Dilated cardiomyopathy: Secondary | ICD-10-CM | POA: Diagnosis not present

## 2022-05-12 DIAGNOSIS — I480 Paroxysmal atrial fibrillation: Secondary | ICD-10-CM | POA: Diagnosis not present

## 2022-05-12 DIAGNOSIS — Z7901 Long term (current) use of anticoagulants: Secondary | ICD-10-CM | POA: Diagnosis not present

## 2022-05-19 DIAGNOSIS — H353131 Nonexudative age-related macular degeneration, bilateral, early dry stage: Secondary | ICD-10-CM | POA: Diagnosis not present

## 2022-05-19 DIAGNOSIS — H401112 Primary open-angle glaucoma, right eye, moderate stage: Secondary | ICD-10-CM | POA: Diagnosis not present

## 2022-05-19 DIAGNOSIS — H2513 Age-related nuclear cataract, bilateral: Secondary | ICD-10-CM | POA: Diagnosis not present

## 2022-05-19 DIAGNOSIS — H401121 Primary open-angle glaucoma, left eye, mild stage: Secondary | ICD-10-CM | POA: Diagnosis not present

## 2022-05-25 DIAGNOSIS — D225 Melanocytic nevi of trunk: Secondary | ICD-10-CM | POA: Diagnosis not present

## 2022-05-25 DIAGNOSIS — L821 Other seborrheic keratosis: Secondary | ICD-10-CM | POA: Diagnosis not present

## 2022-05-25 DIAGNOSIS — C44311 Basal cell carcinoma of skin of nose: Secondary | ICD-10-CM | POA: Diagnosis not present

## 2022-05-25 DIAGNOSIS — X32XXXA Exposure to sunlight, initial encounter: Secondary | ICD-10-CM | POA: Diagnosis not present

## 2022-05-25 DIAGNOSIS — L570 Actinic keratosis: Secondary | ICD-10-CM | POA: Diagnosis not present

## 2022-05-25 DIAGNOSIS — D2261 Melanocytic nevi of right upper limb, including shoulder: Secondary | ICD-10-CM | POA: Diagnosis not present

## 2022-05-25 DIAGNOSIS — L578 Other skin changes due to chronic exposure to nonionizing radiation: Secondary | ICD-10-CM | POA: Diagnosis not present

## 2022-05-25 DIAGNOSIS — D2262 Melanocytic nevi of left upper limb, including shoulder: Secondary | ICD-10-CM | POA: Diagnosis not present

## 2022-05-25 DIAGNOSIS — D485 Neoplasm of uncertain behavior of skin: Secondary | ICD-10-CM | POA: Diagnosis not present

## 2022-05-30 DIAGNOSIS — I480 Paroxysmal atrial fibrillation: Secondary | ICD-10-CM | POA: Diagnosis not present

## 2022-05-30 DIAGNOSIS — Z7901 Long term (current) use of anticoagulants: Secondary | ICD-10-CM | POA: Diagnosis not present

## 2022-06-09 DIAGNOSIS — H60543 Acute eczematoid otitis externa, bilateral: Secondary | ICD-10-CM | POA: Diagnosis not present

## 2022-06-09 DIAGNOSIS — R490 Dysphonia: Secondary | ICD-10-CM | POA: Diagnosis not present

## 2022-06-09 DIAGNOSIS — K219 Gastro-esophageal reflux disease without esophagitis: Secondary | ICD-10-CM | POA: Diagnosis not present

## 2022-06-14 DIAGNOSIS — Z79899 Other long term (current) drug therapy: Secondary | ICD-10-CM | POA: Diagnosis not present

## 2022-06-14 DIAGNOSIS — Z125 Encounter for screening for malignant neoplasm of prostate: Secondary | ICD-10-CM | POA: Diagnosis not present

## 2022-06-14 DIAGNOSIS — E785 Hyperlipidemia, unspecified: Secondary | ICD-10-CM | POA: Diagnosis not present

## 2022-06-14 DIAGNOSIS — I10 Essential (primary) hypertension: Secondary | ICD-10-CM | POA: Diagnosis not present

## 2022-06-14 DIAGNOSIS — E039 Hypothyroidism, unspecified: Secondary | ICD-10-CM | POA: Diagnosis not present

## 2022-06-14 DIAGNOSIS — N1831 Chronic kidney disease, stage 3a: Secondary | ICD-10-CM | POA: Diagnosis not present

## 2022-06-14 DIAGNOSIS — I2510 Atherosclerotic heart disease of native coronary artery without angina pectoris: Secondary | ICD-10-CM | POA: Diagnosis not present

## 2022-06-14 DIAGNOSIS — I480 Paroxysmal atrial fibrillation: Secondary | ICD-10-CM | POA: Diagnosis not present

## 2022-06-20 DIAGNOSIS — E039 Hypothyroidism, unspecified: Secondary | ICD-10-CM | POA: Diagnosis not present

## 2022-06-20 DIAGNOSIS — I2699 Other pulmonary embolism without acute cor pulmonale: Secondary | ICD-10-CM | POA: Diagnosis not present

## 2022-06-20 DIAGNOSIS — Z79899 Other long term (current) drug therapy: Secondary | ICD-10-CM | POA: Diagnosis not present

## 2022-06-20 DIAGNOSIS — Z125 Encounter for screening for malignant neoplasm of prostate: Secondary | ICD-10-CM | POA: Diagnosis not present

## 2022-06-20 DIAGNOSIS — I480 Paroxysmal atrial fibrillation: Secondary | ICD-10-CM | POA: Diagnosis not present

## 2022-06-20 DIAGNOSIS — I10 Essential (primary) hypertension: Secondary | ICD-10-CM | POA: Diagnosis not present

## 2022-06-20 DIAGNOSIS — N1831 Chronic kidney disease, stage 3a: Secondary | ICD-10-CM | POA: Diagnosis not present

## 2022-06-20 DIAGNOSIS — Z0000 Encounter for general adult medical examination without abnormal findings: Secondary | ICD-10-CM | POA: Diagnosis not present

## 2022-06-20 DIAGNOSIS — Z7901 Long term (current) use of anticoagulants: Secondary | ICD-10-CM | POA: Diagnosis not present

## 2022-06-20 DIAGNOSIS — I2510 Atherosclerotic heart disease of native coronary artery without angina pectoris: Secondary | ICD-10-CM | POA: Diagnosis not present

## 2022-06-20 DIAGNOSIS — E785 Hyperlipidemia, unspecified: Secondary | ICD-10-CM | POA: Diagnosis not present

## 2022-06-22 DIAGNOSIS — C44311 Basal cell carcinoma of skin of nose: Secondary | ICD-10-CM | POA: Diagnosis not present

## 2022-07-06 DIAGNOSIS — I420 Dilated cardiomyopathy: Secondary | ICD-10-CM | POA: Diagnosis not present

## 2022-09-07 DIAGNOSIS — Z7901 Long term (current) use of anticoagulants: Secondary | ICD-10-CM | POA: Diagnosis not present

## 2022-09-07 DIAGNOSIS — I480 Paroxysmal atrial fibrillation: Secondary | ICD-10-CM | POA: Diagnosis not present

## 2022-09-20 DIAGNOSIS — Z7901 Long term (current) use of anticoagulants: Secondary | ICD-10-CM | POA: Diagnosis not present

## 2022-09-20 DIAGNOSIS — I480 Paroxysmal atrial fibrillation: Secondary | ICD-10-CM | POA: Diagnosis not present

## 2022-10-11 DIAGNOSIS — I420 Dilated cardiomyopathy: Secondary | ICD-10-CM | POA: Diagnosis not present

## 2022-10-19 DIAGNOSIS — Z7901 Long term (current) use of anticoagulants: Secondary | ICD-10-CM | POA: Diagnosis not present

## 2022-10-19 DIAGNOSIS — I480 Paroxysmal atrial fibrillation: Secondary | ICD-10-CM | POA: Diagnosis not present

## 2022-10-21 DIAGNOSIS — Z7901 Long term (current) use of anticoagulants: Secondary | ICD-10-CM | POA: Diagnosis not present

## 2022-10-21 DIAGNOSIS — I480 Paroxysmal atrial fibrillation: Secondary | ICD-10-CM | POA: Diagnosis not present

## 2022-10-24 DIAGNOSIS — I480 Paroxysmal atrial fibrillation: Secondary | ICD-10-CM | POA: Diagnosis not present

## 2022-10-24 DIAGNOSIS — Z7901 Long term (current) use of anticoagulants: Secondary | ICD-10-CM | POA: Diagnosis not present

## 2022-11-03 DIAGNOSIS — E785 Hyperlipidemia, unspecified: Secondary | ICD-10-CM | POA: Diagnosis not present

## 2022-11-03 DIAGNOSIS — R0989 Other specified symptoms and signs involving the circulatory and respiratory systems: Secondary | ICD-10-CM | POA: Diagnosis not present

## 2022-11-03 DIAGNOSIS — R0602 Shortness of breath: Secondary | ICD-10-CM | POA: Diagnosis not present

## 2022-11-03 DIAGNOSIS — R42 Dizziness and giddiness: Secondary | ICD-10-CM | POA: Diagnosis not present

## 2022-11-03 DIAGNOSIS — I480 Paroxysmal atrial fibrillation: Secondary | ICD-10-CM | POA: Diagnosis not present

## 2022-11-03 DIAGNOSIS — Z7901 Long term (current) use of anticoagulants: Secondary | ICD-10-CM | POA: Diagnosis not present

## 2022-11-03 DIAGNOSIS — I10 Essential (primary) hypertension: Secondary | ICD-10-CM | POA: Diagnosis not present

## 2022-11-03 DIAGNOSIS — I5022 Chronic systolic (congestive) heart failure: Secondary | ICD-10-CM | POA: Diagnosis not present

## 2022-11-03 DIAGNOSIS — N1831 Chronic kidney disease, stage 3a: Secondary | ICD-10-CM | POA: Diagnosis not present

## 2022-11-03 DIAGNOSIS — R7989 Other specified abnormal findings of blood chemistry: Secondary | ICD-10-CM | POA: Diagnosis not present

## 2022-11-04 ENCOUNTER — Other Ambulatory Visit
Admission: RE | Admit: 2022-11-04 | Discharge: 2022-11-04 | Disposition: A | Discharge status: Home / Self Care | Payer: PPO | Source: Ambulatory Visit | Attending: Internal Medicine | Admitting: Internal Medicine

## 2022-11-04 ENCOUNTER — Other Ambulatory Visit: Payer: Self-pay | Admitting: Internal Medicine

## 2022-11-04 DIAGNOSIS — N1831 Chronic kidney disease, stage 3a: Secondary | ICD-10-CM | POA: Diagnosis not present

## 2022-11-04 DIAGNOSIS — E785 Hyperlipidemia, unspecified: Secondary | ICD-10-CM | POA: Diagnosis not present

## 2022-11-04 DIAGNOSIS — R42 Dizziness and giddiness: Secondary | ICD-10-CM | POA: Diagnosis not present

## 2022-11-04 DIAGNOSIS — Z03818 Encounter for observation for suspected exposure to other biological agents ruled out: Secondary | ICD-10-CM | POA: Diagnosis not present

## 2022-11-04 DIAGNOSIS — I5022 Chronic systolic (congestive) heart failure: Secondary | ICD-10-CM | POA: Diagnosis not present

## 2022-11-04 DIAGNOSIS — R0989 Other specified symptoms and signs involving the circulatory and respiratory systems: Secondary | ICD-10-CM | POA: Diagnosis not present

## 2022-11-04 DIAGNOSIS — I480 Paroxysmal atrial fibrillation: Secondary | ICD-10-CM | POA: Diagnosis not present

## 2022-11-04 DIAGNOSIS — I10 Essential (primary) hypertension: Secondary | ICD-10-CM | POA: Diagnosis not present

## 2022-11-04 DIAGNOSIS — Z7901 Long term (current) use of anticoagulants: Secondary | ICD-10-CM | POA: Diagnosis not present

## 2022-11-04 DIAGNOSIS — R7989 Other specified abnormal findings of blood chemistry: Secondary | ICD-10-CM | POA: Diagnosis not present

## 2022-11-04 DIAGNOSIS — R0602 Shortness of breath: Secondary | ICD-10-CM | POA: Diagnosis not present

## 2022-11-04 LAB — D-DIMER, QUANTITATIVE: D-Dimer, Quant: 0.85 ug/mL-FEU — ABNORMAL HIGH (ref 0.00–0.50)

## 2022-11-07 ENCOUNTER — Emergency Department: Payer: PPO

## 2022-11-07 ENCOUNTER — Emergency Department
Admission: EM | Admit: 2022-11-07 | Discharge: 2022-11-07 | Disposition: A | Discharge status: Home / Self Care | Payer: PPO | Attending: Emergency Medicine | Admitting: Emergency Medicine

## 2022-11-07 ENCOUNTER — Other Ambulatory Visit: Payer: Self-pay

## 2022-11-07 DIAGNOSIS — Z20822 Contact with and (suspected) exposure to covid-19: Secondary | ICD-10-CM | POA: Insufficient documentation

## 2022-11-07 DIAGNOSIS — Z95810 Presence of automatic (implantable) cardiac defibrillator: Secondary | ICD-10-CM | POA: Diagnosis not present

## 2022-11-07 DIAGNOSIS — I517 Cardiomegaly: Secondary | ICD-10-CM | POA: Diagnosis not present

## 2022-11-07 DIAGNOSIS — R531 Weakness: Secondary | ICD-10-CM | POA: Insufficient documentation

## 2022-11-07 DIAGNOSIS — R0602 Shortness of breath: Secondary | ICD-10-CM | POA: Diagnosis present

## 2022-11-07 DIAGNOSIS — Z7901 Long term (current) use of anticoagulants: Secondary | ICD-10-CM | POA: Diagnosis not present

## 2022-11-07 DIAGNOSIS — I6503 Occlusion and stenosis of bilateral vertebral arteries: Secondary | ICD-10-CM | POA: Diagnosis not present

## 2022-11-07 DIAGNOSIS — I509 Heart failure, unspecified: Secondary | ICD-10-CM | POA: Insufficient documentation

## 2022-11-07 DIAGNOSIS — J40 Bronchitis, not specified as acute or chronic: Secondary | ICD-10-CM | POA: Insufficient documentation

## 2022-11-07 DIAGNOSIS — I2510 Atherosclerotic heart disease of native coronary artery without angina pectoris: Secondary | ICD-10-CM | POA: Diagnosis not present

## 2022-11-07 DIAGNOSIS — R918 Other nonspecific abnormal finding of lung field: Secondary | ICD-10-CM | POA: Diagnosis not present

## 2022-11-07 DIAGNOSIS — R519 Headache, unspecified: Secondary | ICD-10-CM | POA: Diagnosis not present

## 2022-11-07 DIAGNOSIS — R29818 Other symptoms and signs involving the nervous system: Secondary | ICD-10-CM | POA: Diagnosis not present

## 2022-11-07 LAB — SARS CORONAVIRUS 2 BY RT PCR: SARS Coronavirus 2 by RT PCR: NEGATIVE

## 2022-11-07 LAB — BASIC METABOLIC PANEL
Anion gap: 9 (ref 5–15)
BUN: 22 mg/dL (ref 8–23)
CO2: 24 mmol/L (ref 22–32)
Calcium: 8.9 mg/dL (ref 8.9–10.3)
Chloride: 106 mmol/L (ref 98–111)
Creatinine, Ser: 1.27 mg/dL — ABNORMAL HIGH (ref 0.61–1.24)
GFR, Estimated: 57 mL/min — ABNORMAL LOW (ref >60)
Glucose, Bld: 123 mg/dL — ABNORMAL HIGH (ref 70–99)
Potassium: 4 mmol/L (ref 3.5–5.1)
Sodium: 139 mmol/L (ref 135–145)

## 2022-11-07 LAB — CBC
HCT: 37.8 % — ABNORMAL LOW (ref 39.0–52.0)
Hemoglobin: 12.7 g/dL — ABNORMAL LOW (ref 13.0–17.0)
MCH: 34.3 pg — ABNORMAL HIGH (ref 26.0–34.0)
MCHC: 33.6 g/dL (ref 30.0–36.0)
MCV: 102.2 fL — ABNORMAL HIGH (ref 80.0–100.0)
Platelets: 175 10*3/uL (ref 150–400)
RBC: 3.7 MIL/uL — ABNORMAL LOW (ref 4.22–5.81)
RDW: 14.3 % (ref 11.5–15.5)
WBC: 8.9 10*3/uL (ref 4.0–10.5)
nRBC: 0 % (ref 0.0–0.2)

## 2022-11-07 LAB — PROTIME-INR
INR: 1.8 — ABNORMAL HIGH (ref 0.8–1.2)
Prothrombin Time: 20.8 seconds — ABNORMAL HIGH (ref 11.4–15.2)

## 2022-11-07 MED ORDER — CEFUROXIME AXETIL 500 MG PO TABS
500.0000 mg | ORAL_TABLET | Freq: Two times a day (BID) | ORAL | 0 refills | Status: AC
Start: 2022-11-07 — End: 2022-11-12

## 2022-11-07 MED ORDER — IOHEXOL 350 MG/ML SOLN
100.0000 mL | Freq: Once | INTRAVENOUS | Status: AC | PRN
Start: 2022-11-07 — End: 2022-11-07
  Administered 2022-11-07: 100 mL via INTRAVENOUS

## 2022-11-07 MED ORDER — DOXYCYCLINE HYCLATE 50 MG PO CAPS: 100.0000 mg | ORAL_CAPSULE | Freq: Two times a day (BID) | ORAL | 0 refills | Status: AC

## 2022-11-07 NOTE — ED Triage Notes (Signed)
Pt to ED for generalized weakness for the past several days. Had visit with Dr Nydia Bouton a few days ago and was scheduled for CT head this Wednesday. Denies recent falls. +bodyaches. Tested neg for covid on Friday. Pt alert and oriented. NAD noted.

## 2022-11-07 NOTE — ED Provider Notes (Signed)
[facility] Provider Note    Event Date/Time   First MD Initiated Contact with Patient 11/07/22 1135     (approximate)   History   Weakness and Headache   HPI Jerry English is a 82 y.o. male CHF, CAD, prior PEs, AICD in place on warfarin who presents today for weakness.  Patient reports approximately 6 days of generalized weakness, body aches, and shortness of breath.  He has had negative COVID test outpatient.  He saw his cardiologist who found him to have an elevated D-dimer and was concerned that he might have a new a PE.  Plan for outpatient CTA chest as well as CT of the head for later this week, but with symptoms getting worse he came here for sooner evaluation.  Currently states he has weakness, body aches, mild headache, shortness of breath.  No specific chest pain, nausea, vomiting, abdominal pain, leg swelling.     Physical Exam   Triage Vital Signs: ED Triage Vitals  Encounter Vitals Group     BP 11/07/22 1101 115/89     Systolic BP Percentile --      Diastolic BP Percentile --      Pulse Rate 11/07/22 1101 67     Resp 11/07/22 1101 20     Temp 11/07/22 1103 98.8 F (37.1 C)     Temp src --      SpO2 11/07/22 1101 96 %     Weight 11/07/22 1102 194 lb (88 kg)     Height 11/07/22 1102 6\' 1"  (1.854 m)     Head Circumference --      Peak Flow --      Pain Score 11/07/22 1101 7     Pain Loc --      Pain Education --      Exclude from Growth Chart --     Most recent vital signs: Vitals:   11/07/22 1200 11/07/22 1320  BP: (!) 102/49 103/67  Pulse: (!) 56 (!) 35  Resp: (!) 21 16  Temp:    SpO2: 97% 96%   Physical Exam: I have reviewed the vital signs and nursing notes. General: Awake, alert, no acute distress.  Nontoxic appearing. Head:  Atraumatic, normocephalic.   ENT:  EOM intact, PERRL. Oral mucosa is pink and moist with no lesions. Neck: Neck is supple with full range of motion, No meningeal signs. Cardiovascular:  RRR, No  murmurs. Peripheral pulses palpable and equal bilaterally. Chest: Palpable AICD in left upper chest wall Respiratory:  Symmetrical chest wall expansion.  No rhonchi, rales, or wheezes.  Good air movement throughout.  No use of accessory muscles.   Musculoskeletal:  No cyanosis or edema. Moving extremities with full ROM Abdomen:  Soft, nontender, nondistended. Neuro:  GCS 15, moving all four extremities, interacting appropriately. Speech clear. Psych:  Calm, appropriate.   Skin:  Warm, dry, no rash.    ED Results / Procedures / Treatments   Labs (all labs ordered are listed, but only abnormal results are displayed) Labs Reviewed  CBC - Abnormal; Notable for the following components:      Result Value   RBC 3.70 (*)    Hemoglobin 12.7 (*)    HCT 37.8 (*)    MCV 102.2 (*)    MCH 34.3 (*)    All other components within normal limits  BASIC METABOLIC PANEL - Abnormal; Notable for the following components:   Glucose, Bld 123 (*)    Creatinine, Ser 1.27 (*)  GFR, Estimated 57 (*)    All other components within normal limits  PROTIME-INR - Abnormal; Notable for the following components:   Prothrombin Time 20.8 (*)    INR 1.8 (*)    All other components within normal limits  SARS CORONAVIRUS 2 BY RT PCR     EKG My EKG interpretation at 1107: Rate of 65, AV dual placed rhythm with occasional PVC.  No acute ST elevation or depression.  RADIOLOGY CT head per my interpretation shows no acute intracranial abnormalities.  CTA chest shows no evidence of PE per my interpretation.  Separately reviewed by radiologist as well.   PROCEDURES:  Critical Care performed: No  Procedures   MEDICATIONS ORDERED IN ED: Medications  iohexol (OMNIPAQUE) 350 MG/ML injection 100 mL (100 mLs Intravenous Contrast Given 11/07/22 1331)     IMPRESSION / MDM / ASSESSMENT AND PLAN / ED COURSE  I reviewed the triage vital signs and the nursing notes.                              Differential  diagnosis includes, but is not limited to, PE, pneumonia, viral URI, bronchitis, pneumothorax.  Patient's presentation is most consistent with acute presentation with potential threat to life or bodily function.  Patient is a 82 year old male presenting today with shortness of breath, body aches, and weakness over the past week.  Symptoms seem most consistent with likely viral URI.  However, patient did have elevated D-dimer at outpatient clinic and was sent in for concern of possible PE.  CTA of chest reveals no evidence of a PE or focal bacterial pneumonia.  There is slight evidence of possible bronchitis.  They stated their outpatient provider was also worried for possible stroke.  However, patient has no focal neurological findings on exam other than mild global weakness which I suspect are secondary to current infection.  CTA head and neck was performed with and without contrast which shows no acute intracranial abnormalities.  There is evidence of severe intradural bilateral vertebral artery stenosis.  I discussed this with Dr. Amada Jupiter from neurology who states that patient is already on recommended prophylactic management with his statin and warfarin.  No further medications needed at this time.  No additional workup.  Patient is safe for discharge as he is otherwise stable with no acute complaints.  Given his age with new bronchitis, will treat for possible bacterial infection with 5-day course of oral antibiotics.  Patient was given strict return precautions and told to follow-up with primary care provider.  Clinical Course as of 11/07/22 1452  Mon Nov 07, 2022  1204 Creatinine(!): 1.27 Creatinine overall comparable with prior baselines. [DW]  1410 CT Angio Chest PE W and/or Wo Contrast No evidence of PE per my interpretation.  There is some opacification which could potentially correlate with pneumonia or bronchitis given his persistent symptoms. [DW]  1414 CT ANGIO HEAD NECK W WO CM Per  my interpretation: Largely unremarkable for acute abnormalities.  Intradural stenosis bilaterally present.  Patient with no history of syncopal episodes though. [DW]  1440 INR(!): 1.8 Will make sure patient is taking his warfarin as prescribed.  Plan for recheck in the next couple days. [DW]  6606 Spoke with neurology Dr. Amada Jupiter regarding the stenosis of the bilateral intradural vertebral arteries.  He said nothing further to do at this time as patient is already on warfarin.  He should be advised about syncopal episodes in the  future as that would be a reason to come back to the emergency department for further evaluation. [DW]    Clinical Course User Index [DW] Janith Lima, MD     FINAL CLINICAL IMPRESSION(S) / ED DIAGNOSES   Final diagnoses:  Bronchitis  Weakness     Rx / DC Orders   ED Discharge Orders          Ordered    doxycycline (VIBRAMYCIN) 50 MG capsule  2 times daily        11/07/22 1445    cefUROXime (CEFTIN) 500 MG tablet  2 times daily with meals        11/07/22 1445             Note:  This document was prepared using Dragon voice recognition software and may include unintentional dictation errors.   Janith Lima, MD 11/07/22 [phone]

## 2022-11-07 NOTE — Discharge Instructions (Signed)
You are seen in the emergency department today for your body aches, shortness of breath, and headache.  Your laboratory workup showed no evidence of blood clots in your lungs and no evidence of a stroke.  We did see some evidence of a bronchitis which if caused by a viral infection peak could be causing your body aches and headache.  I recommend given your age, starting you on an antibiotic to take for the next 5 days.  Please follow-up with your regular provider for reassessment.  Please keep an eye out for episodes of syncope, worsening shortness of breath, or chest pain related to the shortness of breath.  If you notice any of these, please come back to the emergency department for further evaluation.

## 2022-11-09 ENCOUNTER — Ambulatory Visit: Payer: PPO

## 2022-11-15 ENCOUNTER — Telehealth: Payer: Self-pay

## 2022-11-15 NOTE — Telephone Encounter (Signed)
Transition Care Management Unsuccessful Follow-up Telephone Call  Date of discharge and from where:  [place] 8/12   Attempts:  1st Attempt  Reason for unsuccessful TCM follow-up call:  No answer/busy   Lenard Forth [facility] Guide, [facility] [phone] [address] [place], [place], [place] [address] Phone: [phone] Email: [email or web address]

## 2022-11-15 NOTE — Telephone Encounter (Signed)
Transition Care Management Unsuccessful Follow-up Telephone Call  Date of discharge and from where:  [place] 8/12  Attempts:  2nd Attempt  Reason for unsuccessful TCM follow-up call:  No answer/busy   Lenard Forth [facility] Guide, [facility] [phone] [address] [place], [place], [place] [address] Phone: [phone] Email: [email or web address]

## 2022-11-17 DIAGNOSIS — H401121 Primary open-angle glaucoma, left eye, mild stage: Secondary | ICD-10-CM | POA: Diagnosis not present

## 2022-11-18 DIAGNOSIS — I2510 Atherosclerotic heart disease of native coronary artery without angina pectoris: Secondary | ICD-10-CM | POA: Diagnosis not present

## 2022-11-18 DIAGNOSIS — I6503 Occlusion and stenosis of bilateral vertebral arteries: Secondary | ICD-10-CM | POA: Diagnosis not present

## 2022-11-18 DIAGNOSIS — R410 Disorientation, unspecified: Secondary | ICD-10-CM | POA: Diagnosis not present

## 2022-11-18 DIAGNOSIS — E039 Hypothyroidism, unspecified: Secondary | ICD-10-CM | POA: Diagnosis not present

## 2022-11-18 DIAGNOSIS — E785 Hyperlipidemia, unspecified: Secondary | ICD-10-CM | POA: Diagnosis not present

## 2022-11-18 DIAGNOSIS — I10 Essential (primary) hypertension: Secondary | ICD-10-CM | POA: Diagnosis not present

## 2022-11-18 DIAGNOSIS — I480 Paroxysmal atrial fibrillation: Secondary | ICD-10-CM | POA: Diagnosis not present

## 2022-11-23 DIAGNOSIS — B354 Tinea corporis: Secondary | ICD-10-CM | POA: Diagnosis not present

## 2022-11-23 DIAGNOSIS — L578 Other skin changes due to chronic exposure to nonionizing radiation: Secondary | ICD-10-CM | POA: Diagnosis not present

## 2022-11-23 DIAGNOSIS — L538 Other specified erythematous conditions: Secondary | ICD-10-CM | POA: Diagnosis not present

## 2022-11-23 DIAGNOSIS — Z85828 Personal history of other malignant neoplasm of skin: Secondary | ICD-10-CM | POA: Diagnosis not present

## 2022-11-23 DIAGNOSIS — L820 Inflamed seborrheic keratosis: Secondary | ICD-10-CM | POA: Diagnosis not present

## 2022-11-23 DIAGNOSIS — D225 Melanocytic nevi of trunk: Secondary | ICD-10-CM | POA: Diagnosis not present

## 2022-11-23 DIAGNOSIS — L821 Other seborrheic keratosis: Secondary | ICD-10-CM | POA: Diagnosis not present

## 2022-11-29 DIAGNOSIS — Z7901 Long term (current) use of anticoagulants: Secondary | ICD-10-CM | POA: Diagnosis not present

## 2022-11-29 DIAGNOSIS — I480 Paroxysmal atrial fibrillation: Secondary | ICD-10-CM | POA: Diagnosis not present

## 2022-12-02 DIAGNOSIS — H43813 Vitreous degeneration, bilateral: Secondary | ICD-10-CM | POA: Diagnosis not present

## 2022-12-02 DIAGNOSIS — I5022 Chronic systolic (congestive) heart failure: Secondary | ICD-10-CM | POA: Diagnosis not present

## 2022-12-02 DIAGNOSIS — Z95810 Presence of automatic (implantable) cardiac defibrillator: Secondary | ICD-10-CM | POA: Diagnosis not present

## 2022-12-02 DIAGNOSIS — Z79899 Other long term (current) drug therapy: Secondary | ICD-10-CM | POA: Diagnosis not present

## 2022-12-02 DIAGNOSIS — H401112 Primary open-angle glaucoma, right eye, moderate stage: Secondary | ICD-10-CM | POA: Diagnosis not present

## 2022-12-02 DIAGNOSIS — R0602 Shortness of breath: Secondary | ICD-10-CM | POA: Diagnosis not present

## 2022-12-02 DIAGNOSIS — I10 Essential (primary) hypertension: Secondary | ICD-10-CM | POA: Diagnosis not present

## 2022-12-02 DIAGNOSIS — R0989 Other specified symptoms and signs involving the circulatory and respiratory systems: Secondary | ICD-10-CM | POA: Diagnosis not present

## 2022-12-02 DIAGNOSIS — H401121 Primary open-angle glaucoma, left eye, mild stage: Secondary | ICD-10-CM | POA: Diagnosis not present

## 2022-12-02 DIAGNOSIS — Z7901 Long term (current) use of anticoagulants: Secondary | ICD-10-CM | POA: Diagnosis not present

## 2022-12-02 DIAGNOSIS — M79671 Pain in right foot: Secondary | ICD-10-CM | POA: Diagnosis not present

## 2022-12-02 DIAGNOSIS — I480 Paroxysmal atrial fibrillation: Secondary | ICD-10-CM | POA: Diagnosis not present

## 2022-12-02 DIAGNOSIS — E785 Hyperlipidemia, unspecified: Secondary | ICD-10-CM | POA: Diagnosis not present

## 2022-12-02 DIAGNOSIS — N1831 Chronic kidney disease, stage 3a: Secondary | ICD-10-CM | POA: Diagnosis not present

## 2022-12-02 DIAGNOSIS — R42 Dizziness and giddiness: Secondary | ICD-10-CM | POA: Diagnosis not present

## 2022-12-02 DIAGNOSIS — H2513 Age-related nuclear cataract, bilateral: Secondary | ICD-10-CM | POA: Diagnosis not present

## 2022-12-13 ENCOUNTER — Encounter: Payer: Self-pay | Admitting: Ophthalmology

## 2022-12-13 DIAGNOSIS — H2511 Age-related nuclear cataract, right eye: Secondary | ICD-10-CM | POA: Diagnosis not present

## 2022-12-13 DIAGNOSIS — H2512 Age-related nuclear cataract, left eye: Secondary | ICD-10-CM | POA: Diagnosis not present

## 2022-12-13 DIAGNOSIS — E039 Hypothyroidism, unspecified: Secondary | ICD-10-CM | POA: Diagnosis not present

## 2022-12-13 DIAGNOSIS — I10 Essential (primary) hypertension: Secondary | ICD-10-CM | POA: Diagnosis not present

## 2022-12-13 DIAGNOSIS — Z23 Encounter for immunization: Secondary | ICD-10-CM | POA: Diagnosis not present

## 2022-12-13 DIAGNOSIS — I2510 Atherosclerotic heart disease of native coronary artery without angina pectoris: Secondary | ICD-10-CM | POA: Diagnosis not present

## 2022-12-13 DIAGNOSIS — I480 Paroxysmal atrial fibrillation: Secondary | ICD-10-CM | POA: Diagnosis not present

## 2022-12-13 DIAGNOSIS — Z7901 Long term (current) use of anticoagulants: Secondary | ICD-10-CM | POA: Diagnosis not present

## 2022-12-13 DIAGNOSIS — I6503 Occlusion and stenosis of bilateral vertebral arteries: Secondary | ICD-10-CM | POA: Diagnosis not present

## 2022-12-13 DIAGNOSIS — H401112 Primary open-angle glaucoma, right eye, moderate stage: Secondary | ICD-10-CM | POA: Diagnosis not present

## 2022-12-13 DIAGNOSIS — N1831 Chronic kidney disease, stage 3a: Secondary | ICD-10-CM | POA: Diagnosis not present

## 2022-12-13 DIAGNOSIS — H401122 Primary open-angle glaucoma, left eye, moderate stage: Secondary | ICD-10-CM | POA: Diagnosis not present

## 2022-12-13 DIAGNOSIS — E785 Hyperlipidemia, unspecified: Secondary | ICD-10-CM | POA: Diagnosis not present

## 2022-12-14 DIAGNOSIS — E039 Hypothyroidism, unspecified: Secondary | ICD-10-CM | POA: Diagnosis not present

## 2022-12-14 DIAGNOSIS — I2510 Atherosclerotic heart disease of native coronary artery without angina pectoris: Secondary | ICD-10-CM | POA: Diagnosis not present

## 2022-12-14 DIAGNOSIS — Z7901 Long term (current) use of anticoagulants: Secondary | ICD-10-CM | POA: Diagnosis not present

## 2022-12-14 DIAGNOSIS — Z79899 Other long term (current) drug therapy: Secondary | ICD-10-CM | POA: Diagnosis not present

## 2022-12-14 DIAGNOSIS — I480 Paroxysmal atrial fibrillation: Secondary | ICD-10-CM | POA: Diagnosis not present

## 2022-12-14 DIAGNOSIS — R791 Abnormal coagulation profile: Secondary | ICD-10-CM | POA: Diagnosis not present

## 2022-12-14 DIAGNOSIS — Z0000 Encounter for general adult medical examination without abnormal findings: Secondary | ICD-10-CM | POA: Diagnosis not present

## 2022-12-14 DIAGNOSIS — Z125 Encounter for screening for malignant neoplasm of prostate: Secondary | ICD-10-CM | POA: Diagnosis not present

## 2022-12-14 DIAGNOSIS — E785 Hyperlipidemia, unspecified: Secondary | ICD-10-CM | POA: Diagnosis not present

## 2022-12-14 DIAGNOSIS — I10 Essential (primary) hypertension: Secondary | ICD-10-CM | POA: Diagnosis not present

## 2022-12-14 DIAGNOSIS — N1831 Chronic kidney disease, stage 3a: Secondary | ICD-10-CM | POA: Diagnosis not present

## 2022-12-15 ENCOUNTER — Encounter: Payer: Self-pay | Admitting: Ophthalmology

## 2022-12-15 DIAGNOSIS — S9031XD Contusion of right foot, subsequent encounter: Secondary | ICD-10-CM | POA: Diagnosis not present

## 2022-12-15 DIAGNOSIS — M79671 Pain in right foot: Secondary | ICD-10-CM | POA: Diagnosis not present

## 2022-12-15 NOTE — Anesthesia Preprocedure Evaluation (Addendum)
Anesthesia Evaluation  Patient identified by MRN, date of birth, ID band Patient awake    Reviewed: Allergy & Precautions, H&P , NPO status , Patient's Chart, lab work & pertinent test results  Airway Mallampati: III  TM Distance: <3 FB Neck ROM: Full    Dental no notable dental hx. (+) Upper Dentures   Pulmonary neg pulmonary ROS Hx trach and fistula tract per cardiologist note, no trach for about 40 years, not on oxygen   Pulmonary exam normal breath sounds clear to auscultation       Cardiovascular hypertension, + angina  + CAD, + Past MI, + Peripheral Vascular Disease and +CHF  Normal cardiovascular exam+ pacemaker + Cardiac Defibrillator  Rhythm:Regular Rate:Normal  2023 LV moderate enlarged, hypocontractile latera, apical, inferior, posterior, EF 20-25%, moderate global  depression, mild LVH, grade I diastolic dysfunction, E/A recersal, LA moderately enlarged, moderate mitral regurg, LV dilation 6.1 cm  HFrEF s/p CRT-P (generator change November 2020),  Hx AICD/dual chamber pacemaker  EKG 12-02-22 AV dual paced rhythm with occasional PVCs, electronic V paced has replaced electronic A paced.   Pacemaker/AICD battery and effectiveness check October 11, 2022, patient not pacer dependent, paced less than 90% of time   Neuro/Psych Seizures -,  PSYCHIATRIC DISORDERS  Depression    Hx crani 1981 per cardiologist note  CVA 05-06-11 negative neurological ROS  negative psych ROS   GI/Hepatic Neg liver ROS,GERD  ,,  Endo/Other  negative endocrine ROS    Renal/GU Renal disease  negative genitourinary   Musculoskeletal negative musculoskeletal ROS (+)    Abdominal   Peds negative pediatric ROS (+)  Hematology negative hematology ROS (+)   Anesthesia Other Findings Hypertension  Glaucoma Presence of combination internal cardiac defibrillator (ICD) and pacemaker Presence of permanent cardiac pacemaker Myocardial  infarction  Previous hx trach, but no trach for many, many years about 40 years Seizures  CHF (congestive heart failure)  AICD (automatic)  Dilated cardiomyopathy Coronary artery disease  Depression GERD (gastroesophageal reflux disease) Peripheral vascular disease  Epistaxis  Vitamin B 12 deficiency Pulmonary embolism without acute cor pulmonale  Cellulitis of chest wall PAF (paroxysmal atrial fibrillation)  Hyperlipemia CKD (chronic kidney disease) stage 3, GFR 30-59 ml/min (HCC)  B12 deficiency Chronic left systolic heart failure (HCC) Mod mitral regurgitation by prior echocardiogram CVA 05-06-11     Reproductive/Obstetrics negative OB ROS                             Anesthesia Physical Anesthesia Plan  ASA: 3  Anesthesia Plan: MAC   Post-op Pain Management:    Induction: Intravenous  PONV Risk Score and Plan:   Airway Management Planned: Natural Airway and Nasal Cannula  Additional Equipment:   Intra-op Plan:   Post-operative Plan:   Informed Consent: I have reviewed the patients History and Physical, chart, labs and discussed the procedure including the risks, benefits and alternatives for the proposed anesthesia with the patient or authorized representative who has indicated his/her understanding and acceptance.     Dental Advisory Given  Plan Discussed with: Anesthesiologist, CRNA and Surgeon  Anesthesia Plan Comments: (Patient consented for risks of anesthesia including but not limited to:  - adverse reactions to medications - damage to eyes, teeth, lips or other oral mucosa - nerve damage due to positioning  - sore throat or hoarseness - Damage to heart, brain, nerves, lungs, other parts of body or loss of life  Patient voiced understanding.)  Anesthesia Quick Evaluation

## 2022-12-20 NOTE — Discharge Instructions (Signed)

## 2022-12-21 ENCOUNTER — Ambulatory Visit: Payer: PPO | Admitting: Anesthesiology

## 2022-12-21 ENCOUNTER — Encounter: Payer: Self-pay | Admitting: Ophthalmology

## 2022-12-21 ENCOUNTER — Ambulatory Visit
Admission: RE | Admit: 2022-12-21 | Discharge: 2022-12-21 | Disposition: A | Discharge status: Home / Self Care | Payer: PPO | Source: Ambulatory Visit | Attending: Ophthalmology | Admitting: Ophthalmology

## 2022-12-21 ENCOUNTER — Encounter
Admission: RE | Disposition: A | Discharge status: Home / Self Care | Payer: Self-pay | Source: Ambulatory Visit | Attending: Ophthalmology

## 2022-12-21 ENCOUNTER — Other Ambulatory Visit: Payer: Self-pay

## 2022-12-21 DIAGNOSIS — H2511 Age-related nuclear cataract, right eye: Secondary | ICD-10-CM | POA: Insufficient documentation

## 2022-12-21 DIAGNOSIS — Z950 Presence of cardiac pacemaker: Secondary | ICD-10-CM | POA: Diagnosis not present

## 2022-12-21 DIAGNOSIS — Z86711 Personal history of pulmonary embolism: Secondary | ICD-10-CM | POA: Diagnosis not present

## 2022-12-21 DIAGNOSIS — I130 Hypertensive heart and chronic kidney disease with heart failure and stage 1 through stage 4 chronic kidney disease, or unspecified chronic kidney disease: Secondary | ICD-10-CM | POA: Insufficient documentation

## 2022-12-21 DIAGNOSIS — I480 Paroxysmal atrial fibrillation: Secondary | ICD-10-CM | POA: Insufficient documentation

## 2022-12-21 DIAGNOSIS — E785 Hyperlipidemia, unspecified: Secondary | ICD-10-CM | POA: Insufficient documentation

## 2022-12-21 DIAGNOSIS — I5040 Unspecified combined systolic (congestive) and diastolic (congestive) heart failure: Secondary | ICD-10-CM | POA: Diagnosis not present

## 2022-12-21 DIAGNOSIS — H401112 Primary open-angle glaucoma, right eye, moderate stage: Secondary | ICD-10-CM | POA: Diagnosis not present

## 2022-12-21 DIAGNOSIS — E538 Deficiency of other specified B group vitamins: Secondary | ICD-10-CM | POA: Diagnosis not present

## 2022-12-21 DIAGNOSIS — R569 Unspecified convulsions: Secondary | ICD-10-CM | POA: Diagnosis not present

## 2022-12-21 DIAGNOSIS — I420 Dilated cardiomyopathy: Secondary | ICD-10-CM | POA: Diagnosis not present

## 2022-12-21 DIAGNOSIS — K219 Gastro-esophageal reflux disease without esophagitis: Secondary | ICD-10-CM | POA: Diagnosis not present

## 2022-12-21 DIAGNOSIS — Z7901 Long term (current) use of anticoagulants: Secondary | ICD-10-CM | POA: Insufficient documentation

## 2022-12-21 DIAGNOSIS — I739 Peripheral vascular disease, unspecified: Secondary | ICD-10-CM | POA: Insufficient documentation

## 2022-12-21 DIAGNOSIS — N183 Chronic kidney disease, stage 3 unspecified: Secondary | ICD-10-CM | POA: Diagnosis not present

## 2022-12-21 DIAGNOSIS — F32A Depression, unspecified: Secondary | ICD-10-CM | POA: Diagnosis not present

## 2022-12-21 DIAGNOSIS — I252 Old myocardial infarction: Secondary | ICD-10-CM | POA: Diagnosis not present

## 2022-12-21 DIAGNOSIS — I25119 Atherosclerotic heart disease of native coronary artery with unspecified angina pectoris: Secondary | ICD-10-CM | POA: Insufficient documentation

## 2022-12-21 HISTORY — DX: Dilated cardiomyopathy: I42.0

## 2022-12-21 HISTORY — DX: Other ventricular tachycardia: I47.29

## 2022-12-21 HISTORY — PX: CATARACT EXTRACTION W/PHACO: SHX586

## 2022-12-21 HISTORY — DX: Nonrheumatic mitral (valve) insufficiency: I34.0

## 2022-12-21 HISTORY — DX: Chronic systolic (congestive) heart failure: I50.22

## 2022-12-21 SURGERY — PHACOEMULSIFICATION, CATARACT, WITH IOL INSERTION
Anesthesia: Monitor Anesthesia Care | Site: Eye | Laterality: Right

## 2022-12-21 MED ORDER — SIGHTPATH DOSE#1 BSS IO SOLN
INTRAOCULAR | Status: DC | PRN
  Administered 2022-12-21: 2 mL

## 2022-12-21 MED ORDER — TETRACAINE HCL 0.5 % OP SOLN
OPHTHALMIC | Status: AC
  Filled 2022-12-21: qty 4

## 2022-12-21 MED ORDER — MIDAZOLAM HCL 2 MG/2ML IJ SOLN
INTRAMUSCULAR | Status: AC
  Filled 2022-12-21: qty 2

## 2022-12-21 MED ORDER — SIGHTPATH DOSE#1 BSS IO SOLN
INTRAOCULAR | Status: DC | PRN
  Administered 2022-12-21: 59 mL via OPHTHALMIC

## 2022-12-21 MED ORDER — TETRACAINE HCL 0.5 % OP SOLN
1.0000 [drp] | OPHTHALMIC | Status: DC | PRN
  Administered 2022-12-21 (×3): 1 [drp] via OPHTHALMIC

## 2022-12-21 MED ORDER — CEFUROXIME OPHTHALMIC INJECTION 1 MG/0.1 ML
INJECTION | OPHTHALMIC | Status: DC | PRN
  Administered 2022-12-21: .1 mL via INTRACAMERAL

## 2022-12-21 MED ORDER — ARMC OPHTHALMIC DILATING DROPS
1.0000 | OPHTHALMIC | Status: DC | PRN
  Administered 2022-12-21 (×3): 1 via OPHTHALMIC

## 2022-12-21 MED ORDER — SIGHTPATH DOSE#1 BSS IO SOLN
INTRAOCULAR | Status: DC | PRN
  Administered 2022-12-21: 15 mL via INTRAOCULAR

## 2022-12-21 MED ORDER — LACTATED RINGERS IV SOLN: INTRAVENOUS | Status: DC

## 2022-12-21 MED ORDER — FENTANYL CITRATE (PF) 100 MCG/2ML IJ SOLN
INTRAMUSCULAR | Status: AC
  Filled 2022-12-21: qty 2

## 2022-12-21 MED ORDER — MIDAZOLAM HCL 2 MG/2ML IJ SOLN
INTRAMUSCULAR | Status: DC | PRN
  Administered 2022-12-21: 1 mg via INTRAVENOUS

## 2022-12-21 MED ORDER — FENTANYL CITRATE (PF) 100 MCG/2ML IJ SOLN
INTRAMUSCULAR | Status: DC | PRN
  Administered 2022-12-21: 50 ug via INTRAVENOUS

## 2022-12-21 MED ORDER — SIGHTPATH DOSE#1 NA HYALUR & NA CHOND-NA HYALUR IO KIT
PACK | INTRAOCULAR | Status: DC | PRN
  Administered 2022-12-21: 1 via OPHTHALMIC

## 2022-12-21 MED ORDER — ARMC OPHTHALMIC DILATING DROPS
OPHTHALMIC | Status: AC
  Filled 2022-12-21: qty 0.5

## 2022-12-21 SURGICAL SUPPLY — 11 items
BLADE DUAL KAHOOK SINGLE USE (BLADE) IMPLANT
CATARACT SUITE SIGHTPATH (MISCELLANEOUS) ×1
FEE CATARACT SUITE SIGHTPATH (MISCELLANEOUS) ×1 IMPLANT
GLOVE SRG 8 PF TXTR STRL LF DI (GLOVE) ×1 IMPLANT
GLOVE SURG ENC TEXT LTX SZ7.5 (GLOVE) ×1 IMPLANT
GLOVE SURG UNDER POLY LF SZ8 (GLOVE) ×1
ICLIP (OPHTHALMIC RELATED) IMPLANT
LENS IOL TECNIS EYHANCE 20.5 (Intraocular Lens) IMPLANT
NDL FILTER BLUNT 18X1 1/2 (NEEDLE) ×1 IMPLANT
NEEDLE FILTER BLUNT 18X1 1/2 (NEEDLE) ×1
SYR 3ML LL SCALE MARK (SYRINGE) ×1 IMPLANT

## 2022-12-21 NOTE — Anesthesia Postprocedure Evaluation (Signed)
Anesthesia Post Note  Patient: Jerry English  Procedure(s) Performed: CATARACT EXTRACTION PHACO AND INTRAOCULAR LENS PLACEMENT (IOC) RIGHT KAHOOK DUAL BLADE GONIOTOMY  6.51  00:41.0 (Right: Eye)  Patient location during evaluation: PACU Anesthesia Type: MAC Level of consciousness: awake and alert Pain management: pain level controlled Vital Signs Assessment: post-procedure vital signs reviewed and stable Respiratory status: spontaneous breathing, nonlabored ventilation, respiratory function stable and patient connected to nasal cannula oxygen Cardiovascular status: stable and blood pressure returned to baseline Postop Assessment: no apparent nausea or vomiting Anesthetic complications: no   No notable events documented.   Last Vitals:  Vitals:   12/21/22 1009 12/21/22 1014  BP: 119/77 122/78  Pulse: 65 64  Resp: 15 13  Temp: (!) 36.3 C (!) 36.3 C  SpO2: 99% 97%    Last Pain:  Vitals:   12/21/22 1014  TempSrc:   PainSc: 0-No pain                 Devian Bartolomei C Amour Cutrone

## 2022-12-21 NOTE — Transfer of Care (Signed)
Immediate Anesthesia Transfer of Care Note  Patient: Jerry English  Procedure(s) Performed: CATARACT EXTRACTION PHACO AND INTRAOCULAR LENS PLACEMENT (IOC) RIGHT KAHOOK DUAL BLADE GONIOTOMY  6.51  00:41.0 (Right: Eye)  Patient Location: PACU  Anesthesia Type: MAC  Level of Consciousness: awake, alert  and patient cooperative  Airway and Oxygen Therapy: Patient Spontanous Breathing and Patient connected to supplemental oxygen  Post-op Assessment: Post-op Vital signs reviewed, Patient's Cardiovascular Status Stable, Respiratory Function Stable, Patent Airway and No signs of Nausea or vomiting  Post-op Vital Signs: Reviewed and stable  Complications: No notable events documented.
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
## 2022-12-21 NOTE — Op Note (Signed)
PREOPERATIVE DIAGNOSIS:  Nuclear sclerotic cataract  right eye. H25.11  moderate stage Primary Open Angle Glaucoma right eye H40.1112  POSTOPERATIVE DIAGNOSIS:    Nuclear sclerotic cataract right eye.     moderate stage Primary Open Angle Glaucoma right eye H40.1112  PROCEDURE:  Phacoemusification with posterior chamber intraocular lens placement of the right eye  [place] Dual Blade goniotomy right eye  Ultrasound time: Procedure(s): CATARACT EXTRACTION PHACO AND INTRAOCULAR LENS PLACEMENT (IOC) RIGHT [place] DUAL BLADE GONIOTOMY  6.51  00:41.0 (Right) LENS:  Implant Name Type Inv. Item Serial No. Manufacturer Lot No. LRB No. Used Action  LENS IOL TECNIS EYHANCE 20.5 - [id number] Intraocular Lens LENS IOL TECNIS EYHANCE 20.5 [id number] SIGHTPATH  Right 1 Implanted    SURGEON:  Deirdre Evener, MD   ANESTHESIA:  Topical with tetracaine drops augmented with 1% preservative-free intracameral lidocaine.    COMPLICATIONS:  None.   DESCRIPTION OF PROCEDURE:  The patient was identified in the holding room and transported to the operating room and placed in the supine position under the operating microscope.  The right eye was identified as the operative eye and it was prepped and draped in the usual sterile ophthalmic fashion.   A 1 millimeter clear-corneal paracentesis was made at the 12:00 position.  0.5 ml of preservative-free 1% lidocaine was injected into the anterior chamber.  The anterior chamber was filled with Viscoat viscoelastic.  A 2.4 millimeter keratome was used to make a near-clear corneal incision at the 9:00 position. The microscope was adjusted and a gonioprism was used to visulaize the trabecular meshwork.  The [facility] Dual Blade was advanced across the anterior chamber under viscoelastic.  The blade was used to mark the trabecular meshwork at the 1:30 position.  The blade was placed two clock hours clockwise into the meshwork.  Proper postioning was confirmed.  The blade ws  passed counterclockwise through the meshwork to excise approximately two to three clock-hours of trabecular meshwork.   A curvilinear capsulorrhexis was made with a cystotome and capsulorrhexis forceps.  Balanced salt solution was used to hydrodissect and hydrodelineate the nucleus.   Phacoemulsification was then used in stop and chop fashion to remove the lens nucleus and epinucleus.  The remaining cortex was then removed using the irrigation and aspiration handpiece. Provisc was then placed into the capsular bag to distend it for lens placement.  A lens was then injected into the capsular bag.  The remaining viscoelastic was aspirated.   Wounds were hydrated with balanced salt solution.  The anterior chamber was inflated to a physiologic pressure with balanced salt solution.  No wound leaks were noted. Sharyl Nimrod  The patient was taken to the recovery room in stable condition without complications of anesthesia or surgery.

## 2022-12-22 DIAGNOSIS — H2512 Age-related nuclear cataract, left eye: Secondary | ICD-10-CM | POA: Diagnosis not present

## 2022-12-27 NOTE — Anesthesia Preprocedure Evaluation (Addendum)
Anesthesia Evaluation  Patient identified by MRN, date of birth, ID band Patient awake    Reviewed: Allergy & Precautions, H&P , NPO status , Patient's Chart, lab work & pertinent test results  Airway Mallampati: III  TM Distance: <3 FB Neck ROM: Full    Dental no notable dental hx.    Pulmonary neg pulmonary ROS Hx trach and fistula tract per cardiologist note, no trach for about 40 years, not on oxygen   Pulmonary exam normal breath sounds clear to auscultation       Cardiovascular hypertension, + angina  + CAD, + Past MI, + Peripheral Vascular Disease and +CHF  Normal cardiovascular exam+ pacemaker + Cardiac Defibrillator  Rhythm:Regular Rate:Normal  2023 LV moderate enlarged, hypocontractile latera, apical, inferior, posterior, EF 20-25%, moderate global  depression, mild LVH, grade I diastolic dysfunction, E/A recersal, LA moderately enlarged, moderate mitral regurg, LV dilation 6.1 cm   HFrEF s/p CRT-P (generator change November 2020),   Hx AICD/dual chamber pacemaker   EKG 12-02-22 AV dual paced rhythm with occasional PVCs, electronic V paced has replaced electronic A paced.    Pacemaker/AICD battery and effectiveness check October 11, 2022, patient not pacer dependent, paced less than 90% of time   Today, October 9, patient is not being paced, as noted per rhythm strip     Neuro/Psych Seizures -,  PSYCHIATRIC DISORDERS  Depression    DISORDERS  Depression    Hx crani 1981 per cardiologist note   CVA 05-06-11 negative neuro ROS  negative neurological ROS  negative psych ROS   GI/Hepatic Neg liver ROS,GERD  ,,  Endo/Other  negative endocrine ROS    Renal/GU Renal disease  negative genitourinary   Musculoskeletal negative musculoskeletal ROS (+)    Abdominal   Peds negative pediatric ROS (+)  Hematology negative hematology ROS (+)   Anesthesia Other Findings Previous cataract 12-21-22 Dr. Juel Burrow  anesthesiologist  Hypertension  Glaucoma Presence of combination internal cardiac defibrillator (ICD) and pacemaker Presence of permanent cardiac pacemaker Myocardial infarction (HCC)  Seizures (HCC) CHF (congestive heart failure) (HCC) AICD (automatic cardioverter/defibrillator) present Coronary artery disease  Depression GERD (gastroesophageal reflux disease) Peripheral vascular disease (HCC) Epistaxis  Vitamin B 12 deficiency Pulmonary embolism without acute cor pulmonale (HCC)  Cellulitis of chest wall PAF (paroxysmal atrial fibrillation) (HCC) Hyperlipemia CKD (chronic kidney disease) stage 3, GFR 30-59 ml/min (HCC)  B12 deficiency Chronic left systolic heart failure (HCC) Dilated cardiomyopathy (HCC) Non-sustained ventricular tachycardia (HCC) Moderate mitral regurgitation by prior echocardiogram    Reproductive/Obstetrics negative OB ROS                             Anesthesia Physical Anesthesia Plan  ASA: 3  Anesthesia Plan: MAC   Post-op Pain Management:    Induction: Intravenous  PONV Risk Score and Plan:   Airway Management Planned: Natural Airway and Nasal Cannula  Additional Equipment:   Intra-op Plan:   Post-operative Plan:   Informed Consent: I have reviewed the patients History and Physical, chart, labs and discussed the procedure including the risks, benefits and alternatives for the proposed anesthesia with the patient or authorized representative who has indicated his/her understanding and acceptance.     Dental Advisory Given  Plan Discussed with: Anesthesiologist, CRNA and Surgeon  Anesthesia Plan Comments: (Patient consented for risks of anesthesia including but not limited to:  - adverse reactions to medications - damage to eyes, teeth, lips or other oral mucosa - nerve  damage due to positioning  - sore throat or hoarseness - Damage to heart, brain, nerves, lungs, other parts of body or loss of life  Patient  voiced understanding.)        Anesthesia Quick Evaluation

## 2023-01-02 NOTE — Discharge Instructions (Signed)

## 2023-01-04 ENCOUNTER — Other Ambulatory Visit: Payer: Self-pay

## 2023-01-04 ENCOUNTER — Ambulatory Visit: Payer: PPO | Admitting: Anesthesiology

## 2023-01-04 ENCOUNTER — Encounter: Payer: Self-pay | Admitting: Ophthalmology

## 2023-01-04 ENCOUNTER — Ambulatory Visit
Admission: RE | Admit: 2023-01-04 | Discharge: 2023-01-04 | Disposition: A | Discharge status: Home / Self Care | Payer: PPO | Source: Ambulatory Visit | Attending: Ophthalmology | Admitting: Ophthalmology

## 2023-01-04 ENCOUNTER — Encounter
Admission: RE | Disposition: A | Discharge status: Home / Self Care | Payer: Self-pay | Source: Ambulatory Visit | Attending: Ophthalmology

## 2023-01-04 DIAGNOSIS — I2510 Atherosclerotic heart disease of native coronary artery without angina pectoris: Secondary | ICD-10-CM | POA: Diagnosis not present

## 2023-01-04 DIAGNOSIS — I739 Peripheral vascular disease, unspecified: Secondary | ICD-10-CM | POA: Insufficient documentation

## 2023-01-04 DIAGNOSIS — N183 Chronic kidney disease, stage 3 unspecified: Secondary | ICD-10-CM | POA: Insufficient documentation

## 2023-01-04 DIAGNOSIS — E785 Hyperlipidemia, unspecified: Secondary | ICD-10-CM | POA: Insufficient documentation

## 2023-01-04 DIAGNOSIS — Z86711 Personal history of pulmonary embolism: Secondary | ICD-10-CM | POA: Diagnosis not present

## 2023-01-04 DIAGNOSIS — H401122 Primary open-angle glaucoma, left eye, moderate stage: Secondary | ICD-10-CM | POA: Diagnosis not present

## 2023-01-04 DIAGNOSIS — K219 Gastro-esophageal reflux disease without esophagitis: Secondary | ICD-10-CM | POA: Insufficient documentation

## 2023-01-04 DIAGNOSIS — F32A Depression, unspecified: Secondary | ICD-10-CM | POA: Diagnosis not present

## 2023-01-04 DIAGNOSIS — Z95810 Presence of automatic (implantable) cardiac defibrillator: Secondary | ICD-10-CM | POA: Diagnosis not present

## 2023-01-04 DIAGNOSIS — H2512 Age-related nuclear cataract, left eye: Secondary | ICD-10-CM | POA: Diagnosis not present

## 2023-01-04 DIAGNOSIS — I5040 Unspecified combined systolic (congestive) and diastolic (congestive) heart failure: Secondary | ICD-10-CM | POA: Diagnosis not present

## 2023-01-04 DIAGNOSIS — Z7901 Long term (current) use of anticoagulants: Secondary | ICD-10-CM | POA: Diagnosis not present

## 2023-01-04 DIAGNOSIS — I130 Hypertensive heart and chronic kidney disease with heart failure and stage 1 through stage 4 chronic kidney disease, or unspecified chronic kidney disease: Secondary | ICD-10-CM | POA: Insufficient documentation

## 2023-01-04 DIAGNOSIS — I480 Paroxysmal atrial fibrillation: Secondary | ICD-10-CM | POA: Diagnosis not present

## 2023-01-04 DIAGNOSIS — I420 Dilated cardiomyopathy: Secondary | ICD-10-CM | POA: Diagnosis not present

## 2023-01-04 DIAGNOSIS — I25119 Atherosclerotic heart disease of native coronary artery with unspecified angina pectoris: Secondary | ICD-10-CM | POA: Diagnosis not present

## 2023-01-04 DIAGNOSIS — I5022 Chronic systolic (congestive) heart failure: Secondary | ICD-10-CM | POA: Diagnosis not present

## 2023-01-04 DIAGNOSIS — I252 Old myocardial infarction: Secondary | ICD-10-CM | POA: Insufficient documentation

## 2023-01-04 HISTORY — PX: CATARACT EXTRACTION W/PHACO: SHX586

## 2023-01-04 SURGERY — PHACOEMULSIFICATION, CATARACT, WITH IOL INSERTION
Anesthesia: Monitor Anesthesia Care | Site: Eye | Laterality: Left

## 2023-01-04 MED ORDER — SIGHTPATH DOSE#1 BSS IO SOLN
INTRAOCULAR | Status: DC | PRN
  Administered 2023-01-04: 55 mL via OPHTHALMIC

## 2023-01-04 MED ORDER — TETRACAINE HCL 0.5 % OP SOLN
OPHTHALMIC | Status: AC
  Filled 2023-01-04: qty 4

## 2023-01-04 MED ORDER — LACTATED RINGERS IV SOLN: INTRAVENOUS | Status: DC

## 2023-01-04 MED ORDER — CEFUROXIME OPHTHALMIC INJECTION 1 MG/0.1 ML
INJECTION | OPHTHALMIC | Status: DC | PRN
  Administered 2023-01-04: .1 mL via INTRACAMERAL

## 2023-01-04 MED ORDER — SIGHTPATH DOSE#1 BSS IO SOLN
INTRAOCULAR | Status: DC | PRN
  Administered 2023-01-04: 1 mL via INTRAMUSCULAR

## 2023-01-04 MED ORDER — ARMC OPHTHALMIC DILATING DROPS
1.0000 | OPHTHALMIC | Status: DC | PRN
  Administered 2023-01-04 (×3): 1 via OPHTHALMIC

## 2023-01-04 MED ORDER — SIGHTPATH DOSE#1 BSS IO SOLN
INTRAOCULAR | Status: DC | PRN
  Administered 2023-01-04: 15 mL

## 2023-01-04 MED ORDER — FENTANYL CITRATE (PF) 100 MCG/2ML IJ SOLN
INTRAMUSCULAR | Status: AC
  Filled 2023-01-04: qty 2

## 2023-01-04 MED ORDER — MIDAZOLAM HCL 2 MG/2ML IJ SOLN
INTRAMUSCULAR | Status: AC
  Filled 2023-01-04: qty 2

## 2023-01-04 MED ORDER — TETRACAINE HCL 0.5 % OP SOLN
1.0000 [drp] | OPHTHALMIC | Status: DC | PRN
  Administered 2023-01-04 (×3): 1 [drp] via OPHTHALMIC

## 2023-01-04 MED ORDER — MIDAZOLAM HCL 2 MG/2ML IJ SOLN
INTRAMUSCULAR | Status: DC | PRN
  Administered 2023-01-04: 2 mg via INTRAVENOUS

## 2023-01-04 MED ORDER — ARMC OPHTHALMIC DILATING DROPS
OPHTHALMIC | Status: AC
  Filled 2023-01-04: qty 0.5

## 2023-01-04 MED ORDER — FENTANYL CITRATE (PF) 100 MCG/2ML IJ SOLN
INTRAMUSCULAR | Status: DC | PRN
  Administered 2023-01-04: 100 ug via INTRAVENOUS

## 2023-01-04 MED ORDER — SIGHTPATH DOSE#1 NA HYALUR & NA CHOND-NA HYALUR IO KIT
PACK | INTRAOCULAR | Status: DC | PRN
  Administered 2023-01-04: 1 via OPHTHALMIC

## 2023-01-04 SURGICAL SUPPLY — 11 items
BLADE DUAL KAHOOK SINGLE USE (BLADE) IMPLANT
CATARACT SUITE SIGHTPATH (MISCELLANEOUS) ×1
FEE CATARACT SUITE SIGHTPATH (MISCELLANEOUS) ×1 IMPLANT
GLOVE SRG 8 PF TXTR STRL LF DI (GLOVE) ×1 IMPLANT
GLOVE SURG ENC TEXT LTX SZ7.5 (GLOVE) ×1 IMPLANT
GLOVE SURG UNDER POLY LF SZ8 (GLOVE) ×1
ICLIP (OPHTHALMIC RELATED) IMPLANT
LENS IOL TECNIS EYHANCE 21.5 (Intraocular Lens) IMPLANT
NDL FILTER BLUNT 18X1 1/2 (NEEDLE) ×1 IMPLANT
NEEDLE FILTER BLUNT 18X1 1/2 (NEEDLE) ×1
SYR 3ML LL SCALE MARK (SYRINGE) ×1 IMPLANT

## 2023-01-04 NOTE — Transfer of Care (Signed)
Immediate Anesthesia Transfer of Care Note  Patient: Jerry English  Procedure(s) Performed: CATARACT EXTRACTION PHACO AND INTRAOCULAR LENS PLACEMENT (IOC) LEFT [place] DUAL BLADE GONIOTOMY  6.56  00:32.8 (Left: Eye)  Patient Location: PACU  Anesthesia Type: MAC  Level of Consciousness: awake, alert  and patient cooperative  Airway and Oxygen Therapy: Patient Spontanous Breathing and Patient connected to supplemental oxygen  Post-op Assessment: Post-op Vital signs reviewed, Patient's Cardiovascular Status Stable, Respiratory Function Stable, Patent Airway and No signs of Nausea or vomiting  Post-op Vital Signs: Reviewed and stable  Complications: No notable events documented.

## 2023-01-04 NOTE — Anesthesia Postprocedure Evaluation (Signed)
Anesthesia Post Note  Patient: Jerry English  Procedure(s) Performed: CATARACT EXTRACTION PHACO AND INTRAOCULAR LENS PLACEMENT (IOC) LEFT [place] DUAL BLADE GONIOTOMY  6.56  00:32.8 (Left: Eye)  Patient location during evaluation: PACU Anesthesia Type: MAC Level of consciousness: awake and alert Pain management: pain level controlled Vital Signs Assessment: post-procedure vital signs reviewed and stable Respiratory status: spontaneous breathing, nonlabored ventilation, respiratory function stable and patient connected to nasal cannula oxygen Cardiovascular status: stable and blood pressure returned to baseline Postop Assessment: no apparent nausea or vomiting Anesthetic complications: no   No notable events documented.   Last Vitals:  Vitals:   01/04/23 0940 01/04/23 0944  BP: 117/77 124/78  Pulse: 65 (!) 52  Resp: 16 16  Temp:  (!) 36.3 C  SpO2: 97% 96%    Last Pain:  Vitals:   01/04/23 0944  TempSrc:   PainSc: 0-No pain                 Sahiba Granholm C Humberto Addo

## 2023-01-04 NOTE — Op Note (Signed)
PREOPERATIVE DIAGNOSIS:  Nuclear sclerotic cataract left eye. H25.12  moderate stage Primary Open Angle Glaucoma left eye H40.1122  POSTOPERATIVE DIAGNOSIS:    Nuclear sclerotic cataract left eye.     moderate stage Primary Open Angle Glaucoma left eye H40.1122  PROCEDURE:  Phacoemusification with posterior chamber intraocular lens placement of the left eye  [place] Dual Blade goniotomy left eye  Ultrasound time: Procedure(s): CATARACT EXTRACTION PHACO AND INTRAOCULAR LENS PLACEMENT (IOC) LEFT [place] DUAL BLADE GONIOTOMY  6.56  00:32.8 (Left)  LENS:  Implant Name Type Inv. Item Serial No. Manufacturer Lot No. LRB No. Used Action  LENS IOL TECNIS EYHANCE 21.5 - [id number] Intraocular Lens LENS IOL TECNIS EYHANCE 21.5 [id number] [employer]  Left 1 Implanted    SURGEON:  Deirdre Evener, MD   ANESTHESIA:  Topical with tetracaine drops augmented with 1% preservative-free intracameral lidocaine.    COMPLICATIONS:  None.   DESCRIPTION OF PROCEDURE:  The patient was identified in the holding room and transported to the operating room and placed in the supine position under the operating microscope.  The left eye was identified as the operative eye and it was prepped and draped in the usual sterile ophthalmic fashion.   A 1 millimeter clear-corneal paracentesis was made at the 5:30 position.  0.5 ml of preservative-free 1% lidocaine was injected into the anterior chamber.  The anterior chamber was filled with Viscoat viscoelastic.  A 2.4 millimeter keratome was used to make a near-clear corneal incision at the 2:30 position. The microscope was adjusted and a gonioprism was used to visulaize the trabecular meshwork.  The [facility] Dual Blade was advanced across the anterior chamber under viscoelastic.  The blade was used to mark the trabecular meshwork at the 7:30 position.  The blade was placed two clock hours clockwise into the meshwork.  Proper postioning was confirmed.  The blade ws passed  counterclockwise through the meshwork to excise approximately two to three clock-hours of trabecular meshwork.   A curvilinear capsulorrhexis was made with a cystotome and capsulorrhexis forceps.  Balanced salt solution was used to hydrodissect and hydrodelineate the nucleus.   Phacoemulsification was then used in stop and chop fashion to remove the lens nucleus and epinucleus.  The remaining cortex was then removed using the irrigation and aspiration handpiece. Provisc was then placed into the capsular bag to distend it for lens placement.  A lens was then injected into the capsular bag.  The remaining viscoelastic was aspirated.   Wounds were hydrated with balanced salt solution.  The anterior chamber was inflated to a physiologic pressure with balanced salt solution.  No wound leaks were noted. Cefuroxime 0.1 ml of a 10mg /ml solution was injected into the anterior chamber for a dose of 1 mg of intracameral antibiotic at the completion of the case.  The patient was taken to the recovery room in stable condition without complications of anesthesia or surgery.
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
## 2023-01-05 ENCOUNTER — Encounter: Payer: Self-pay | Admitting: Ophthalmology

## 2023-01-10 DIAGNOSIS — I420 Dilated cardiomyopathy: Secondary | ICD-10-CM | POA: Diagnosis not present

## 2023-01-23 ENCOUNTER — Ambulatory Visit: Payer: PPO | Admitting: Urology

## 2023-01-23 ENCOUNTER — Encounter: Payer: Self-pay | Admitting: Urology

## 2023-01-23 VITALS — BP 128/70 | HR 74 | Ht 73.0 in | Wt 197.0 lb

## 2023-01-23 DIAGNOSIS — R972 Elevated prostate specific antigen [PSA]: Secondary | ICD-10-CM | POA: Diagnosis not present

## 2023-01-23 DIAGNOSIS — Z860101 Personal history of adenomatous and serrated colon polyps: Secondary | ICD-10-CM | POA: Diagnosis not present

## 2023-01-23 DIAGNOSIS — K5909 Other constipation: Secondary | ICD-10-CM | POA: Diagnosis not present

## 2023-01-27 ENCOUNTER — Encounter: Payer: Self-pay | Admitting: Urology

## 2023-02-14 ENCOUNTER — Telehealth: Payer: Self-pay | Admitting: Urology

## 2023-02-14 NOTE — Telephone Encounter (Signed)
Pt called office asking about scheduling biopsy.  Pt was informed we were waiting on Cardiac Clearance from Dr. Juliann Pares

## 2023-02-22 NOTE — Telephone Encounter (Signed)
Fax came in we can now schedule prostate biosy p

## 2023-03-07 DIAGNOSIS — Z79899 Other long term (current) drug therapy: Secondary | ICD-10-CM | POA: Diagnosis not present

## 2023-03-07 DIAGNOSIS — R42 Dizziness and giddiness: Secondary | ICD-10-CM | POA: Diagnosis not present

## 2023-03-07 DIAGNOSIS — I10 Essential (primary) hypertension: Secondary | ICD-10-CM | POA: Diagnosis not present

## 2023-03-07 DIAGNOSIS — Z95810 Presence of automatic (implantable) cardiac defibrillator: Secondary | ICD-10-CM | POA: Diagnosis not present

## 2023-03-07 DIAGNOSIS — I5022 Chronic systolic (congestive) heart failure: Secondary | ICD-10-CM | POA: Diagnosis not present

## 2023-03-07 DIAGNOSIS — I480 Paroxysmal atrial fibrillation: Secondary | ICD-10-CM | POA: Diagnosis not present

## 2023-03-07 DIAGNOSIS — N1831 Chronic kidney disease, stage 3a: Secondary | ICD-10-CM | POA: Diagnosis not present

## 2023-03-07 DIAGNOSIS — R0989 Other specified symptoms and signs involving the circulatory and respiratory systems: Secondary | ICD-10-CM | POA: Diagnosis not present

## 2023-03-07 DIAGNOSIS — R0602 Shortness of breath: Secondary | ICD-10-CM | POA: Diagnosis not present

## 2023-03-07 DIAGNOSIS — Z7901 Long term (current) use of anticoagulants: Secondary | ICD-10-CM | POA: Diagnosis not present

## 2023-03-07 DIAGNOSIS — E785 Hyperlipidemia, unspecified: Secondary | ICD-10-CM | POA: Diagnosis not present

## 2023-03-07 DIAGNOSIS — I420 Dilated cardiomyopathy: Secondary | ICD-10-CM | POA: Diagnosis not present

## 2023-03-16 ENCOUNTER — Ambulatory Visit: Payer: PPO | Admitting: Urology

## 2023-03-16 VITALS — BP 104/68 | HR 73 | Ht 73.0 in | Wt 199.0 lb

## 2023-03-16 DIAGNOSIS — R972 Elevated prostate specific antigen [PSA]: Secondary | ICD-10-CM | POA: Diagnosis not present

## 2023-03-16 DIAGNOSIS — C61 Malignant neoplasm of prostate: Secondary | ICD-10-CM | POA: Diagnosis not present

## 2023-03-16 DIAGNOSIS — Z2989 Encounter for other specified prophylactic measures: Secondary | ICD-10-CM | POA: Diagnosis not present

## 2023-03-16 MED ORDER — GENTAMICIN SULFATE 40 MG/ML IJ SOLN
80.0000 mg | Freq: Once | INTRAMUSCULAR | Status: AC
Start: 2023-03-16 — End: 2023-03-16
  Administered 2023-03-16: 80 mg via INTRAMUSCULAR

## 2023-03-16 MED ORDER — LEVOFLOXACIN 500 MG PO TABS
500.0000 mg | ORAL_TABLET | Freq: Once | ORAL | Status: AC
Start: 2023-03-16 — End: 2023-03-16
  Administered 2023-03-16: 500 mg via ORAL

## 2023-03-16 NOTE — Progress Notes (Signed)
   Prostate Biopsy Procedure   Informed consent was obtained after discussing risks/benefits of the procedure.  A time out was performed to ensure correct patient identity.  Pre-Procedure: - Last PSA Level: 9.32 - Gentamicin given prophylactically - Levaquin 500 mg administered PO -Transrectal Ultrasound performed revealing a 21.3 gm prostate -No significant hypoechoic or median lobe noted  Procedure: - Prostate block performed using 10 cc 1% lidocaine and biopsies taken from sextant areas, a total of 12 under ultrasound guidance.  Post-Procedure: - Patient tolerated the procedure well - He was counseled to seek immediate medical attention if experiences any severe pain, significant bleeding, or fevers - Return in one week to discuss biopsy results  Irineo Axon, MD

## 2023-04-03 ENCOUNTER — Encounter: Payer: Self-pay | Admitting: Urology

## 2023-04-03 ENCOUNTER — Ambulatory Visit: Payer: PPO | Admitting: Urology

## 2023-04-03 VITALS — BP 142/78 | HR 74 | Ht 73.0 in | Wt 197.0 lb

## 2023-04-03 DIAGNOSIS — C61 Malignant neoplasm of prostate: Secondary | ICD-10-CM
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
## 2023-04-05 ENCOUNTER — Encounter: Payer: Self-pay | Admitting: Radiation Oncology

## 2023-04-05 ENCOUNTER — Ambulatory Visit
Admission: RE | Admit: 2023-04-05 | Discharge: 2023-04-05 | Disposition: A | Discharge status: Home / Self Care | Payer: PPO | Source: Ambulatory Visit | Attending: Radiation Oncology | Admitting: Radiation Oncology

## 2023-04-05 ENCOUNTER — Ambulatory Visit: Payer: PPO | Admitting: Urology

## 2023-04-05 VITALS — BP 115/84 | HR 78 | Temp 97.1°F | Resp 14 | Ht 73.0 in | Wt 206.5 lb

## 2023-04-05 DIAGNOSIS — Z8042 Family history of malignant neoplasm of prostate: Secondary | ICD-10-CM | POA: Diagnosis not present

## 2023-04-05 DIAGNOSIS — Z95810 Presence of automatic (implantable) cardiac defibrillator: Secondary | ICD-10-CM | POA: Insufficient documentation

## 2023-04-05 DIAGNOSIS — I5040 Unspecified combined systolic (congestive) and diastolic (congestive) heart failure: Secondary | ICD-10-CM | POA: Insufficient documentation

## 2023-04-05 DIAGNOSIS — I739 Peripheral vascular disease, unspecified: Secondary | ICD-10-CM | POA: Diagnosis not present

## 2023-04-05 DIAGNOSIS — N183 Chronic kidney disease, stage 3 unspecified: Secondary | ICD-10-CM | POA: Insufficient documentation

## 2023-04-05 DIAGNOSIS — Z79899 Other long term (current) drug therapy: Secondary | ICD-10-CM | POA: Insufficient documentation

## 2023-04-05 DIAGNOSIS — I480 Paroxysmal atrial fibrillation: Secondary | ICD-10-CM | POA: Diagnosis not present

## 2023-04-05 DIAGNOSIS — K219 Gastro-esophageal reflux disease without esophagitis: Secondary | ICD-10-CM | POA: Insufficient documentation

## 2023-04-05 DIAGNOSIS — I2510 Atherosclerotic heart disease of native coronary artery without angina pectoris: Secondary | ICD-10-CM | POA: Insufficient documentation

## 2023-04-05 DIAGNOSIS — I420 Dilated cardiomyopathy: Secondary | ICD-10-CM | POA: Insufficient documentation

## 2023-04-05 DIAGNOSIS — I252 Old myocardial infarction: Secondary | ICD-10-CM | POA: Insufficient documentation

## 2023-04-05 DIAGNOSIS — C61 Malignant neoplasm of prostate: Secondary | ICD-10-CM | POA: Diagnosis not present

## 2023-04-05 DIAGNOSIS — Z191 Hormone sensitive malignancy status: Secondary | ICD-10-CM | POA: Diagnosis not present

## 2023-04-05 DIAGNOSIS — K5900 Constipation, unspecified: Secondary | ICD-10-CM | POA: Insufficient documentation

## 2023-04-05 DIAGNOSIS — E785 Hyperlipidemia, unspecified: Secondary | ICD-10-CM | POA: Diagnosis not present

## 2023-04-05 DIAGNOSIS — E538 Deficiency of other specified B group vitamins: Secondary | ICD-10-CM | POA: Diagnosis not present

## 2023-04-05 DIAGNOSIS — Z7901 Long term (current) use of anticoagulants: Secondary | ICD-10-CM | POA: Diagnosis not present

## 2023-04-05 DIAGNOSIS — I130 Hypertensive heart and chronic kidney disease with heart failure and stage 1 through stage 4 chronic kidney disease, or unspecified chronic kidney disease: Secondary | ICD-10-CM | POA: Diagnosis not present

## 2023-04-05 DIAGNOSIS — G40909 Epilepsy, unspecified, not intractable, without status epilepticus: Secondary | ICD-10-CM | POA: Diagnosis not present

## 2023-04-05 DIAGNOSIS — Z86711 Personal history of pulmonary embolism: Secondary | ICD-10-CM | POA: Insufficient documentation
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
## 2023-04-06 ENCOUNTER — Other Ambulatory Visit: Payer: Self-pay

## 2023-04-07 ENCOUNTER — Telehealth: Payer: Self-pay

## 2023-04-07 NOTE — Telephone Encounter (Signed)
-----   Message from Nurse Cala Bradford D sent at 04/05/2023 11:31 AM EST ----- Regarding: Eligard and Markers Placed Good morning!    This patient will need to have markers placed and an Eligard injection.   Thanks, Ricki Rodriguez

## 2023-04-07 NOTE — Telephone Encounter (Signed)
No PA required for Eligard.  

## 2023-04-11 ENCOUNTER — Ambulatory Visit
Admission: RE | Admit: 2023-04-11 | Discharge: 2023-04-11 | Disposition: A | Discharge status: Home / Self Care | Payer: PPO | Source: Ambulatory Visit | Attending: Radiation Oncology | Admitting: Radiation Oncology

## 2023-04-11 DIAGNOSIS — C61 Malignant neoplasm of prostate: Secondary | ICD-10-CM | POA: Diagnosis not present

## 2023-04-11 DIAGNOSIS — I700 Atherosclerosis of aorta: Secondary | ICD-10-CM | POA: Insufficient documentation

## 2023-04-11 MED ORDER — FLOTUFOLASTAT F 18 GALLIUM 296-5846 MBQ/ML IV SOLN
8.3300 | Freq: Once | INTRAVENOUS | Status: AC
  Administered 2023-04-11: 8.33 via INTRAVENOUS
  Filled 2023-04-11: qty 9

## 2023-04-21 ENCOUNTER — Ambulatory Visit: Admit: 2023-04-21 | Payer: PPO | Admitting: Gastroenterology

## 2023-04-21 SURGERY — COLONOSCOPY WITH PROPOFOL
Anesthesia: General

## 2023-05-02 DIAGNOSIS — C61 Malignant neoplasm of prostate: Secondary | ICD-10-CM | POA: Diagnosis not present

## 2023-05-02 DIAGNOSIS — Z191 Hormone sensitive malignancy status: Secondary | ICD-10-CM | POA: Diagnosis not present

## 2023-05-03 ENCOUNTER — Other Ambulatory Visit: Payer: Self-pay

## 2023-05-03 ENCOUNTER — Emergency Department: Payer: PPO

## 2023-05-03 ENCOUNTER — Emergency Department
Admission: EM | Admit: 2023-05-03 | Discharge: 2023-05-03 | Disposition: A | Discharge status: Home / Self Care | Payer: PPO | Attending: Emergency Medicine | Admitting: Emergency Medicine

## 2023-05-03 ENCOUNTER — Encounter: Payer: Self-pay | Admitting: Intensive Care

## 2023-05-03 DIAGNOSIS — S0990XA Unspecified injury of head, initial encounter: Secondary | ICD-10-CM | POA: Diagnosis not present

## 2023-05-03 DIAGNOSIS — G9389 Other specified disorders of brain: Secondary | ICD-10-CM | POA: Diagnosis not present

## 2023-05-03 DIAGNOSIS — H538 Other visual disturbances: Secondary | ICD-10-CM | POA: Diagnosis not present

## 2023-05-03 DIAGNOSIS — R079 Chest pain, unspecified: Secondary | ICD-10-CM | POA: Diagnosis not present

## 2023-05-03 DIAGNOSIS — Z950 Presence of cardiac pacemaker: Secondary | ICD-10-CM | POA: Diagnosis not present

## 2023-05-03 DIAGNOSIS — I771 Stricture of artery: Secondary | ICD-10-CM | POA: Diagnosis not present

## 2023-05-03 DIAGNOSIS — H532 Diplopia: Secondary | ICD-10-CM | POA: Diagnosis not present

## 2023-05-03 DIAGNOSIS — R0602 Shortness of breath: Secondary | ICD-10-CM | POA: Diagnosis not present

## 2023-05-03 DIAGNOSIS — Z96611 Presence of right artificial shoulder joint: Secondary | ICD-10-CM | POA: Diagnosis not present

## 2023-05-03 DIAGNOSIS — R0789 Other chest pain: Secondary | ICD-10-CM | POA: Diagnosis not present

## 2023-05-03 DIAGNOSIS — Y92410 Unspecified street and highway as the place of occurrence of the external cause: Secondary | ICD-10-CM | POA: Diagnosis not present

## 2023-05-03 DIAGNOSIS — I6782 Cerebral ischemia: Secondary | ICD-10-CM | POA: Diagnosis not present

## 2023-05-03 HISTORY — DX: Malignant neoplasm of prostate: C61

## 2023-05-03 LAB — PROTIME-INR
INR: 1.4 — ABNORMAL HIGH (ref 0.8–1.2)
Prothrombin Time: 16.9 s — ABNORMAL HIGH (ref 11.4–15.2)
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
## 2023-05-03 NOTE — Discharge Instructions (Signed)
 CT imaging of your head shows no acute injuries.  There was a possible area that showed a chronic which is very old spot of possible bleeding in the brain.  This is nothing new and unchanged.  You can follow-up with your primary care provider for outpatient monitoring but nothing further to do at this time.
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
## 2023-05-10 ENCOUNTER — Ambulatory Visit: Payer: PPO | Admitting: Urology

## 2023-05-10 VITALS — BP 116/70 | HR 70 | Ht 73.0 in | Wt 197.0 lb

## 2023-05-10 DIAGNOSIS — C61 Malignant neoplasm of prostate: Secondary | ICD-10-CM

## 2023-05-10 DIAGNOSIS — Z2989 Encounter for other specified prophylactic measures: Secondary | ICD-10-CM

## 2023-05-10 MED ORDER — LEVOFLOXACIN 500 MG PO TABS
500.0000 mg | ORAL_TABLET | Freq: Once | ORAL | Status: AC
Start: 2023-05-10 — End: 2023-05-10
  Administered 2023-05-10: 500 mg via ORAL

## 2023-05-10 MED ORDER — LEUPROLIDE ACETATE (6 MONTH) 45 MG ~~LOC~~ KIT
45.0000 mg | PACK | Freq: Once | SUBCUTANEOUS | Status: AC
Start: 2023-05-10 — End: 2023-05-10
  Administered 2023-05-10: 45 mg via SUBCUTANEOUS

## 2023-05-10 MED ORDER — GENTAMICIN SULFATE 40 MG/ML IJ SOLN
80.0000 mg | Freq: Once | INTRAMUSCULAR | Status: AC
Start: 2023-05-10 — End: 2023-05-10
  Administered 2023-05-10: 80 mg via INTRAMUSCULAR

## 2023-05-10 NOTE — Progress Notes (Signed)
   05/10/23  CC: gold fiducial marker placement  HPI: 83 y.o. male with prostate cancer who presents today for placement of fiducial markers in anticipation of his upcoming IMRT with Dr. Rushie Chestnut.  Prostate Gold fiducial Marker Placement Procedure   Informed consent was obtained after discussing risks/benefits of the procedure.  A time out was performed to ensure correct patient identity.  Pre-Procedure: - Gentamicin given prophylactically - PO Levaquin 500 mg also given today  Procedure: - Rectal ultrasound probe was placed without difficulty and the prostate visualized - Prostatic block performed with 10 mL 1% Xylocaine - 3 fiducial gold seed markers placed, one at right base, one at left base, one at apex of prostate gland under transrectal ultrasound guidance  Post-Procedure: - Patient tolerated the procedure well - He was counseled to seek immediate medical attention if experiences any severe pain, significant bleeding, or fevers - Rationale and side effects of ADT were discussed    Irineo Axon, MD

## 2023-05-10 NOTE — Progress Notes (Signed)
Eligard SubQ Injection   Due to Prostate Cancer patient is present today for a Eligard Injection.  Medication: Eligard 6 month Dose: 45 mg  Location: right  Lot: [id number] Exp: 09/19/2024  Patient tolerated well, no complications were noted  Performed by: Ples Specter CMA  Per Dr. Lonna Cobb patient is to continue therapy for Once . patient today along with reminder continue on Vitamin D 800-1000iu and Calcium 1000-1200mg  daily while on Androgen Deprivation Therapy.  PA approval dates:

## 2023-05-12 ENCOUNTER — Ambulatory Visit
Admission: RE | Admit: 2023-05-12 | Discharge: 2023-05-12 | Disposition: A | Discharge status: Home / Self Care | Payer: PPO | Source: Ambulatory Visit | Attending: Radiation Oncology | Admitting: Radiation Oncology

## 2023-05-12 DIAGNOSIS — I739 Peripheral vascular disease, unspecified: Secondary | ICD-10-CM | POA: Diagnosis not present

## 2023-05-12 DIAGNOSIS — Z79899 Other long term (current) drug therapy: Secondary | ICD-10-CM | POA: Diagnosis not present

## 2023-05-12 DIAGNOSIS — E785 Hyperlipidemia, unspecified: Secondary | ICD-10-CM | POA: Insufficient documentation

## 2023-05-12 DIAGNOSIS — G40909 Epilepsy, unspecified, not intractable, without status epilepticus: Secondary | ICD-10-CM | POA: Diagnosis not present

## 2023-05-12 DIAGNOSIS — Z7901 Long term (current) use of anticoagulants: Secondary | ICD-10-CM | POA: Insufficient documentation

## 2023-05-12 DIAGNOSIS — N183 Chronic kidney disease, stage 3 unspecified: Secondary | ICD-10-CM | POA: Insufficient documentation

## 2023-05-12 DIAGNOSIS — K5900 Constipation, unspecified: Secondary | ICD-10-CM | POA: Diagnosis not present

## 2023-05-12 DIAGNOSIS — C61 Malignant neoplasm of prostate: Secondary | ICD-10-CM | POA: Insufficient documentation

## 2023-05-12 DIAGNOSIS — I252 Old myocardial infarction: Secondary | ICD-10-CM | POA: Diagnosis not present

## 2023-05-12 DIAGNOSIS — I130 Hypertensive heart and chronic kidney disease with heart failure and stage 1 through stage 4 chronic kidney disease, or unspecified chronic kidney disease: Secondary | ICD-10-CM | POA: Diagnosis not present

## 2023-05-12 DIAGNOSIS — I2510 Atherosclerotic heart disease of native coronary artery without angina pectoris: Secondary | ICD-10-CM | POA: Insufficient documentation

## 2023-05-12 DIAGNOSIS — Z8042 Family history of malignant neoplasm of prostate: Secondary | ICD-10-CM | POA: Insufficient documentation

## 2023-05-12 DIAGNOSIS — I5040 Unspecified combined systolic (congestive) and diastolic (congestive) heart failure: Secondary | ICD-10-CM | POA: Insufficient documentation

## 2023-05-12 DIAGNOSIS — I420 Dilated cardiomyopathy: Secondary | ICD-10-CM | POA: Diagnosis not present

## 2023-05-12 DIAGNOSIS — Z95810 Presence of automatic (implantable) cardiac defibrillator: Secondary | ICD-10-CM | POA: Insufficient documentation

## 2023-05-12 DIAGNOSIS — K219 Gastro-esophageal reflux disease without esophagitis: Secondary | ICD-10-CM | POA: Diagnosis not present

## 2023-05-12 DIAGNOSIS — E538 Deficiency of other specified B group vitamins: Secondary | ICD-10-CM | POA: Diagnosis not present

## 2023-05-12 DIAGNOSIS — I480 Paroxysmal atrial fibrillation: Secondary | ICD-10-CM | POA: Insufficient documentation

## 2023-05-12 DIAGNOSIS — Z86711 Personal history of pulmonary embolism: Secondary | ICD-10-CM | POA: Insufficient documentation

## 2023-05-12 DIAGNOSIS — Z191 Hormone sensitive malignancy status: Secondary | ICD-10-CM | POA: Diagnosis not present

## 2023-05-15 DIAGNOSIS — C61 Malignant neoplasm of prostate: Secondary | ICD-10-CM | POA: Diagnosis not present

## 2023-05-15 DIAGNOSIS — Z191 Hormone sensitive malignancy status: Secondary | ICD-10-CM | POA: Diagnosis not present

## 2023-05-16 DIAGNOSIS — K219 Gastro-esophageal reflux disease without esophagitis: Secondary | ICD-10-CM | POA: Diagnosis not present

## 2023-05-16 DIAGNOSIS — R490 Dysphonia: Secondary | ICD-10-CM | POA: Diagnosis not present

## 2023-05-18 ENCOUNTER — Other Ambulatory Visit: Payer: Self-pay | Admitting: *Deleted

## 2023-05-18 DIAGNOSIS — C61 Malignant neoplasm of prostate: Secondary | ICD-10-CM

## 2023-05-22 ENCOUNTER — Ambulatory Visit
Admission: RE | Admit: 2023-05-22 | Discharge: 2023-05-22 | Disposition: A | Discharge status: Home / Self Care | Payer: PPO | Source: Ambulatory Visit | Attending: Radiation Oncology | Admitting: Radiation Oncology

## 2023-05-23 ENCOUNTER — Ambulatory Visit
Admission: RE | Admit: 2023-05-23 | Discharge: 2023-05-23 | Disposition: A | Discharge status: Home / Self Care | Payer: PPO | Source: Ambulatory Visit | Attending: Radiation Oncology | Admitting: Radiation Oncology

## 2023-05-23 ENCOUNTER — Other Ambulatory Visit: Payer: Self-pay

## 2023-05-23 DIAGNOSIS — Z510 Encounter for antineoplastic radiation therapy: Secondary | ICD-10-CM | POA: Diagnosis not present

## 2023-05-23 DIAGNOSIS — C61 Malignant neoplasm of prostate: Secondary | ICD-10-CM | POA: Diagnosis not present

## 2023-05-23 DIAGNOSIS — Z191 Hormone sensitive malignancy status: Secondary | ICD-10-CM | POA: Diagnosis not present

## 2023-05-23 LAB — RAD ONC ARIA SESSION SUMMARY
Course Elapsed Days: 0
Plan Fractions Treated to Date: 1
Plan Prescribed Dose Per Fraction: 2.075 Gy
Plan Total Fractions Prescribed: 40
Plan Total Prescribed Dose: 83 Gy
Reference Point Dosage Given to Date: 2.075 Gy
Reference Point Session Dosage Given: 2.075 Gy
Session Number: 1

## 2023-05-24 ENCOUNTER — Other Ambulatory Visit: Payer: Self-pay

## 2023-05-24 ENCOUNTER — Ambulatory Visit
Admission: RE | Admit: 2023-05-24 | Discharge: 2023-05-24 | Disposition: A | Discharge status: Home / Self Care | Payer: PPO | Source: Ambulatory Visit | Attending: Radiation Oncology | Admitting: Radiation Oncology

## 2023-05-24 DIAGNOSIS — Z510 Encounter for antineoplastic radiation therapy: Secondary | ICD-10-CM | POA: Diagnosis not present

## 2023-05-24 DIAGNOSIS — Z191 Hormone sensitive malignancy status: Secondary | ICD-10-CM | POA: Diagnosis not present

## 2023-05-24 DIAGNOSIS — C61 Malignant neoplasm of prostate: Secondary | ICD-10-CM | POA: Diagnosis not present

## 2023-05-24 LAB — RAD ONC ARIA SESSION SUMMARY
Course Elapsed Days: 1
Plan Fractions Treated to Date: 2
Plan Prescribed Dose Per Fraction: 2.075 Gy
Plan Total Fractions Prescribed: 40
Plan Total Prescribed Dose: 83 Gy
Reference Point Dosage Given to Date: 4.15 Gy
Reference Point Session Dosage Given: 2.075 Gy
Session Number: 2

## 2023-05-25 ENCOUNTER — Ambulatory Visit
Admission: RE | Admit: 2023-05-25 | Discharge: 2023-05-25 | Disposition: A | Discharge status: Home / Self Care | Payer: PPO | Source: Ambulatory Visit | Attending: Radiation Oncology | Admitting: Radiation Oncology

## 2023-05-25 ENCOUNTER — Other Ambulatory Visit: Payer: Self-pay

## 2023-05-25 DIAGNOSIS — Z510 Encounter for antineoplastic radiation therapy: Secondary | ICD-10-CM | POA: Diagnosis not present

## 2023-05-25 DIAGNOSIS — C61 Malignant neoplasm of prostate: Secondary | ICD-10-CM | POA: Diagnosis not present

## 2023-05-25 DIAGNOSIS — Z191 Hormone sensitive malignancy status: Secondary | ICD-10-CM | POA: Diagnosis not present

## 2023-05-25 LAB — RAD ONC ARIA SESSION SUMMARY
Course Elapsed Days: 2
Plan Fractions Treated to Date: 3
Plan Prescribed Dose Per Fraction: 2.075 Gy
Plan Total Fractions Prescribed: 40
Plan Total Prescribed Dose: 83 Gy
Reference Point Dosage Given to Date: 6.225 Gy
Reference Point Session Dosage Given: 2.075 Gy
Session Number: 3

## 2023-05-26 ENCOUNTER — Ambulatory Visit
Admission: RE | Admit: 2023-05-26 | Discharge: 2023-05-26 | Disposition: A | Discharge status: Home / Self Care | Payer: PPO | Source: Ambulatory Visit | Attending: Radiation Oncology | Admitting: Radiation Oncology

## 2023-05-26 ENCOUNTER — Other Ambulatory Visit: Payer: Self-pay

## 2023-05-26 DIAGNOSIS — C61 Malignant neoplasm of prostate: Secondary | ICD-10-CM | POA: Diagnosis not present

## 2023-05-26 DIAGNOSIS — Z191 Hormone sensitive malignancy status: Secondary | ICD-10-CM | POA: Diagnosis not present

## 2023-05-26 DIAGNOSIS — Z510 Encounter for antineoplastic radiation therapy: Secondary | ICD-10-CM | POA: Diagnosis not present

## 2023-05-26 LAB — RAD ONC ARIA SESSION SUMMARY
Course Elapsed Days: 3
Plan Fractions Treated to Date: 4
Plan Prescribed Dose Per Fraction: 2.075 Gy
Plan Total Fractions Prescribed: 40
Plan Total Prescribed Dose: 83 Gy
Reference Point Dosage Given to Date: 8.3 Gy
Reference Point Session Dosage Given: 2.075 Gy
Session Number: 4

## 2023-05-29 ENCOUNTER — Ambulatory Visit
Admission: RE | Admit: 2023-05-29 | Discharge: 2023-05-29 | Disposition: A | Discharge status: Home / Self Care | Payer: PPO | Source: Ambulatory Visit | Attending: Radiation Oncology | Admitting: Radiation Oncology

## 2023-05-29 ENCOUNTER — Other Ambulatory Visit: Payer: Self-pay

## 2023-05-29 DIAGNOSIS — Z79899 Other long term (current) drug therapy: Secondary | ICD-10-CM | POA: Insufficient documentation

## 2023-05-29 DIAGNOSIS — Z08 Encounter for follow-up examination after completed treatment for malignant neoplasm: Secondary | ICD-10-CM | POA: Diagnosis not present

## 2023-05-29 DIAGNOSIS — C61 Malignant neoplasm of prostate: Secondary | ICD-10-CM | POA: Diagnosis not present

## 2023-05-29 DIAGNOSIS — I2510 Atherosclerotic heart disease of native coronary artery without angina pectoris: Secondary | ICD-10-CM | POA: Insufficient documentation

## 2023-05-29 DIAGNOSIS — K5900 Constipation, unspecified: Secondary | ICD-10-CM | POA: Insufficient documentation

## 2023-05-29 DIAGNOSIS — Z95810 Presence of automatic (implantable) cardiac defibrillator: Secondary | ICD-10-CM | POA: Diagnosis not present

## 2023-05-29 DIAGNOSIS — Z85828 Personal history of other malignant neoplasm of skin: Secondary | ICD-10-CM | POA: Diagnosis not present

## 2023-05-29 DIAGNOSIS — N183 Chronic kidney disease, stage 3 unspecified: Secondary | ICD-10-CM | POA: Diagnosis not present

## 2023-05-29 DIAGNOSIS — Z86711 Personal history of pulmonary embolism: Secondary | ICD-10-CM | POA: Diagnosis not present

## 2023-05-29 DIAGNOSIS — Z510 Encounter for antineoplastic radiation therapy: Secondary | ICD-10-CM | POA: Diagnosis not present

## 2023-05-29 DIAGNOSIS — Z8042 Family history of malignant neoplasm of prostate: Secondary | ICD-10-CM | POA: Insufficient documentation

## 2023-05-29 DIAGNOSIS — Z7901 Long term (current) use of anticoagulants: Secondary | ICD-10-CM | POA: Insufficient documentation

## 2023-05-29 DIAGNOSIS — L309 Dermatitis, unspecified: Secondary | ICD-10-CM | POA: Diagnosis not present

## 2023-05-29 DIAGNOSIS — I420 Dilated cardiomyopathy: Secondary | ICD-10-CM | POA: Diagnosis not present

## 2023-05-29 DIAGNOSIS — I5040 Unspecified combined systolic (congestive) and diastolic (congestive) heart failure: Secondary | ICD-10-CM | POA: Insufficient documentation

## 2023-05-29 DIAGNOSIS — Z191 Hormone sensitive malignancy status: Secondary | ICD-10-CM | POA: Diagnosis not present

## 2023-05-29 DIAGNOSIS — I252 Old myocardial infarction: Secondary | ICD-10-CM | POA: Diagnosis not present

## 2023-05-29 DIAGNOSIS — K219 Gastro-esophageal reflux disease without esophagitis: Secondary | ICD-10-CM | POA: Insufficient documentation

## 2023-05-29 DIAGNOSIS — E785 Hyperlipidemia, unspecified: Secondary | ICD-10-CM | POA: Diagnosis not present

## 2023-05-29 DIAGNOSIS — L578 Other skin changes due to chronic exposure to nonionizing radiation: Secondary | ICD-10-CM | POA: Diagnosis not present

## 2023-05-29 DIAGNOSIS — E538 Deficiency of other specified B group vitamins: Secondary | ICD-10-CM | POA: Insufficient documentation

## 2023-05-29 DIAGNOSIS — G40909 Epilepsy, unspecified, not intractable, without status epilepticus: Secondary | ICD-10-CM | POA: Diagnosis not present

## 2023-05-29 DIAGNOSIS — I739 Peripheral vascular disease, unspecified: Secondary | ICD-10-CM | POA: Insufficient documentation

## 2023-05-29 DIAGNOSIS — I130 Hypertensive heart and chronic kidney disease with heart failure and stage 1 through stage 4 chronic kidney disease, or unspecified chronic kidney disease: Secondary | ICD-10-CM | POA: Insufficient documentation

## 2023-05-29 DIAGNOSIS — I480 Paroxysmal atrial fibrillation: Secondary | ICD-10-CM | POA: Diagnosis not present

## 2023-05-29 LAB — RAD ONC ARIA SESSION SUMMARY
Course Elapsed Days: 6
Plan Fractions Treated to Date: 5
Plan Prescribed Dose Per Fraction: 2.075 Gy
Plan Total Fractions Prescribed: 40
Plan Total Prescribed Dose: 83 Gy
Reference Point Dosage Given to Date: 10.375 Gy
Reference Point Session Dosage Given: 2.075 Gy
Session Number: 5

## 2023-05-30 ENCOUNTER — Ambulatory Visit
Admission: RE | Admit: 2023-05-30 | Discharge: 2023-05-30 | Disposition: A | Discharge status: Home / Self Care | Payer: PPO | Source: Ambulatory Visit | Attending: Radiation Oncology | Admitting: Radiation Oncology

## 2023-05-30 ENCOUNTER — Other Ambulatory Visit: Payer: Self-pay

## 2023-05-30 DIAGNOSIS — Z191 Hormone sensitive malignancy status: Secondary | ICD-10-CM | POA: Diagnosis not present

## 2023-05-30 DIAGNOSIS — Z510 Encounter for antineoplastic radiation therapy: Secondary | ICD-10-CM | POA: Diagnosis not present

## 2023-05-30 DIAGNOSIS — C61 Malignant neoplasm of prostate: Secondary | ICD-10-CM | POA: Diagnosis not present

## 2023-05-30 LAB — RAD ONC ARIA SESSION SUMMARY
Course Elapsed Days: 7
Plan Fractions Treated to Date: 6
Plan Prescribed Dose Per Fraction: 2.075 Gy
Plan Total Fractions Prescribed: 40
Plan Total Prescribed Dose: 83 Gy
Reference Point Dosage Given to Date: 12.45 Gy
Reference Point Session Dosage Given: 2.075 Gy
Session Number: 6

## 2023-05-31 ENCOUNTER — Inpatient Hospital Stay: Payer: PPO | Attending: Radiation Oncology

## 2023-05-31 ENCOUNTER — Other Ambulatory Visit: Payer: Self-pay

## 2023-05-31 ENCOUNTER — Ambulatory Visit
Admission: RE | Admit: 2023-05-31 | Discharge: 2023-05-31 | Disposition: A | Discharge status: Home / Self Care | Payer: PPO | Source: Ambulatory Visit | Attending: Radiation Oncology | Admitting: Radiation Oncology

## 2023-05-31 DIAGNOSIS — C61 Malignant neoplasm of prostate: Secondary | ICD-10-CM | POA: Insufficient documentation

## 2023-05-31 DIAGNOSIS — Z191 Hormone sensitive malignancy status: Secondary | ICD-10-CM | POA: Diagnosis not present

## 2023-05-31 DIAGNOSIS — Z510 Encounter for antineoplastic radiation therapy: Secondary | ICD-10-CM | POA: Diagnosis not present

## 2023-05-31 LAB — RAD ONC ARIA SESSION SUMMARY
Course Elapsed Days: 8
Plan Fractions Treated to Date: 7
Plan Prescribed Dose Per Fraction: 2.075 Gy
Plan Total Fractions Prescribed: 40
Plan Total Prescribed Dose: 83 Gy
Reference Point Dosage Given to Date: 14.525 Gy
Reference Point Session Dosage Given: 2.075 Gy
Session Number: 7

## 2023-05-31 LAB — CBC (CANCER CENTER ONLY)
HCT: 37 % — ABNORMAL LOW (ref 39.0–52.0)
Hemoglobin: 12.7 g/dL — ABNORMAL LOW (ref 13.0–17.0)
MCH: 35 pg — ABNORMAL HIGH (ref 26.0–34.0)
MCHC: 34.3 g/dL (ref 30.0–36.0)
MCV: 101.9 fL — ABNORMAL HIGH (ref 80.0–100.0)
Platelet Count: 176 10*3/uL (ref 150–400)
RBC: 3.63 MIL/uL — ABNORMAL LOW (ref 4.22–5.81)
RDW: 14.5 % (ref 11.5–15.5)
WBC Count: 5.6 10*3/uL (ref 4.0–10.5)
nRBC: 0 % (ref 0.0–0.2)

## 2023-06-01 ENCOUNTER — Other Ambulatory Visit: Payer: Self-pay

## 2023-06-01 ENCOUNTER — Ambulatory Visit
Admission: RE | Admit: 2023-06-01 | Discharge: 2023-06-01 | Disposition: A | Discharge status: Home / Self Care | Payer: PPO | Source: Ambulatory Visit | Attending: Radiation Oncology | Admitting: Radiation Oncology

## 2023-06-01 DIAGNOSIS — C61 Malignant neoplasm of prostate: Secondary | ICD-10-CM | POA: Diagnosis not present

## 2023-06-01 DIAGNOSIS — Z191 Hormone sensitive malignancy status: Secondary | ICD-10-CM | POA: Diagnosis not present

## 2023-06-01 DIAGNOSIS — Z510 Encounter for antineoplastic radiation therapy: Secondary | ICD-10-CM | POA: Diagnosis not present

## 2023-06-01 LAB — RAD ONC ARIA SESSION SUMMARY
Course Elapsed Days: 9
Plan Fractions Treated to Date: 8
Plan Prescribed Dose Per Fraction: 2.075 Gy
Plan Total Fractions Prescribed: 40
Plan Total Prescribed Dose: 83 Gy
Reference Point Dosage Given to Date: 16.6 Gy
Reference Point Session Dosage Given: 2.075 Gy
Session Number: 8

## 2023-06-02 ENCOUNTER — Ambulatory Visit
Admission: RE | Admit: 2023-06-02 | Discharge: 2023-06-02 | Disposition: A | Discharge status: Home / Self Care | Payer: PPO | Source: Ambulatory Visit | Attending: Radiation Oncology | Admitting: Radiation Oncology

## 2023-06-02 ENCOUNTER — Other Ambulatory Visit: Payer: Self-pay

## 2023-06-02 DIAGNOSIS — Z510 Encounter for antineoplastic radiation therapy: Secondary | ICD-10-CM | POA: Diagnosis not present

## 2023-06-02 DIAGNOSIS — C61 Malignant neoplasm of prostate: Secondary | ICD-10-CM | POA: Diagnosis not present

## 2023-06-02 DIAGNOSIS — Z191 Hormone sensitive malignancy status: Secondary | ICD-10-CM | POA: Diagnosis not present

## 2023-06-02 LAB — RAD ONC ARIA SESSION SUMMARY
Course Elapsed Days: 10
Plan Fractions Treated to Date: 9
Plan Prescribed Dose Per Fraction: 2.075 Gy
Plan Total Fractions Prescribed: 40
Plan Total Prescribed Dose: 83 Gy
Reference Point Dosage Given to Date: 18.675 Gy
Reference Point Session Dosage Given: 2.075 Gy
Session Number: 9

## 2023-06-05 ENCOUNTER — Other Ambulatory Visit: Payer: Self-pay

## 2023-06-05 ENCOUNTER — Ambulatory Visit
Admission: RE | Admit: 2023-06-05 | Discharge: 2023-06-05 | Disposition: A | Discharge status: Home / Self Care | Payer: PPO | Source: Ambulatory Visit | Attending: Radiation Oncology | Admitting: Radiation Oncology

## 2023-06-05 DIAGNOSIS — Z191 Hormone sensitive malignancy status: Secondary | ICD-10-CM | POA: Diagnosis not present

## 2023-06-05 DIAGNOSIS — Z510 Encounter for antineoplastic radiation therapy: Secondary | ICD-10-CM | POA: Diagnosis not present

## 2023-06-05 DIAGNOSIS — C61 Malignant neoplasm of prostate: Secondary | ICD-10-CM | POA: Diagnosis not present

## 2023-06-05 LAB — RAD ONC ARIA SESSION SUMMARY
Course Elapsed Days: 13
Plan Fractions Treated to Date: 10
Plan Prescribed Dose Per Fraction: 2.075 Gy
Plan Total Fractions Prescribed: 40
Plan Total Prescribed Dose: 83 Gy
Reference Point Dosage Given to Date: 20.75 Gy
Reference Point Session Dosage Given: 2.075 Gy
Session Number: 10

## 2023-06-06 ENCOUNTER — Ambulatory Visit
Admission: RE | Admit: 2023-06-06 | Discharge: 2023-06-06 | Disposition: A | Discharge status: Home / Self Care | Payer: PPO | Source: Ambulatory Visit | Attending: Radiation Oncology | Admitting: Radiation Oncology

## 2023-06-06 ENCOUNTER — Other Ambulatory Visit: Payer: Self-pay

## 2023-06-06 DIAGNOSIS — Z191 Hormone sensitive malignancy status: Secondary | ICD-10-CM | POA: Diagnosis not present

## 2023-06-06 DIAGNOSIS — Z961 Presence of intraocular lens: Secondary | ICD-10-CM | POA: Diagnosis not present

## 2023-06-06 DIAGNOSIS — C61 Malignant neoplasm of prostate: Secondary | ICD-10-CM | POA: Diagnosis not present

## 2023-06-06 DIAGNOSIS — Z510 Encounter for antineoplastic radiation therapy: Secondary | ICD-10-CM | POA: Diagnosis not present

## 2023-06-06 DIAGNOSIS — H353131 Nonexudative age-related macular degeneration, bilateral, early dry stage: Secondary | ICD-10-CM | POA: Diagnosis not present

## 2023-06-06 DIAGNOSIS — H401122 Primary open-angle glaucoma, left eye, moderate stage: Secondary | ICD-10-CM | POA: Diagnosis not present

## 2023-06-06 DIAGNOSIS — H401112 Primary open-angle glaucoma, right eye, moderate stage: Secondary | ICD-10-CM | POA: Diagnosis not present

## 2023-06-06 LAB — RAD ONC ARIA SESSION SUMMARY
Course Elapsed Days: 14
Plan Fractions Treated to Date: 11
Plan Prescribed Dose Per Fraction: 2.075 Gy
Plan Total Fractions Prescribed: 40
Plan Total Prescribed Dose: 83 Gy
Reference Point Dosage Given to Date: 22.825 Gy
Reference Point Session Dosage Given: 2.075 Gy
Session Number: 11

## 2023-06-07 ENCOUNTER — Ambulatory Visit
Admission: RE | Admit: 2023-06-07 | Discharge: 2023-06-07 | Disposition: A | Discharge status: Home / Self Care | Payer: PPO | Source: Ambulatory Visit | Attending: Radiation Oncology | Admitting: Radiation Oncology

## 2023-06-07 ENCOUNTER — Other Ambulatory Visit: Payer: Self-pay

## 2023-06-07 DIAGNOSIS — Z191 Hormone sensitive malignancy status: Secondary | ICD-10-CM | POA: Diagnosis not present

## 2023-06-07 DIAGNOSIS — Z510 Encounter for antineoplastic radiation therapy: Secondary | ICD-10-CM | POA: Diagnosis not present

## 2023-06-07 DIAGNOSIS — C61 Malignant neoplasm of prostate: Secondary | ICD-10-CM | POA: Diagnosis not present

## 2023-06-07 LAB — RAD ONC ARIA SESSION SUMMARY
Course Elapsed Days: 15
Plan Fractions Treated to Date: 12
Plan Prescribed Dose Per Fraction: 2.075 Gy
Plan Total Fractions Prescribed: 40
Plan Total Prescribed Dose: 83 Gy
Reference Point Dosage Given to Date: 24.9 Gy
Reference Point Session Dosage Given: 2.075 Gy
Session Number: 12

## 2023-06-08 ENCOUNTER — Other Ambulatory Visit: Payer: Self-pay

## 2023-06-08 ENCOUNTER — Ambulatory Visit
Admission: RE | Admit: 2023-06-08 | Discharge: 2023-06-08 | Disposition: A | Discharge status: Home / Self Care | Payer: PPO | Source: Ambulatory Visit | Attending: Radiation Oncology | Admitting: Radiation Oncology

## 2023-06-08 DIAGNOSIS — Z510 Encounter for antineoplastic radiation therapy: Secondary | ICD-10-CM | POA: Diagnosis not present

## 2023-06-08 DIAGNOSIS — Z191 Hormone sensitive malignancy status: Secondary | ICD-10-CM | POA: Diagnosis not present

## 2023-06-08 DIAGNOSIS — C61 Malignant neoplasm of prostate: Secondary | ICD-10-CM | POA: Diagnosis not present

## 2023-06-08 LAB — RAD ONC ARIA SESSION SUMMARY
Course Elapsed Days: 16
Plan Fractions Treated to Date: 13
Plan Prescribed Dose Per Fraction: 2.075 Gy
Plan Total Fractions Prescribed: 40
Plan Total Prescribed Dose: 83 Gy
Reference Point Dosage Given to Date: 26.975 Gy
Reference Point Session Dosage Given: 2.075 Gy
Session Number: 13

## 2023-06-09 ENCOUNTER — Ambulatory Visit
Admission: RE | Admit: 2023-06-09 | Discharge: 2023-06-09 | Disposition: A | Discharge status: Home / Self Care | Payer: PPO | Source: Ambulatory Visit | Attending: Radiation Oncology | Admitting: Radiation Oncology

## 2023-06-09 ENCOUNTER — Other Ambulatory Visit: Payer: Self-pay

## 2023-06-09 DIAGNOSIS — C61 Malignant neoplasm of prostate: Secondary | ICD-10-CM | POA: Diagnosis not present

## 2023-06-09 DIAGNOSIS — Z191 Hormone sensitive malignancy status: Secondary | ICD-10-CM | POA: Diagnosis not present

## 2023-06-09 DIAGNOSIS — Z510 Encounter for antineoplastic radiation therapy: Secondary | ICD-10-CM | POA: Diagnosis not present

## 2023-06-09 LAB — RAD ONC ARIA SESSION SUMMARY
Course Elapsed Days: 17
Plan Fractions Treated to Date: 14
Plan Prescribed Dose Per Fraction: 2.075 Gy
Plan Total Fractions Prescribed: 40
Plan Total Prescribed Dose: 83 Gy
Reference Point Dosage Given to Date: 29.05 Gy
Reference Point Session Dosage Given: 2.075 Gy
Session Number: 14

## 2023-06-12 ENCOUNTER — Other Ambulatory Visit: Payer: Self-pay

## 2023-06-12 ENCOUNTER — Ambulatory Visit
Admission: RE | Admit: 2023-06-12 | Discharge: 2023-06-12 | Disposition: A | Discharge status: Home / Self Care | Payer: PPO | Source: Ambulatory Visit | Attending: Radiation Oncology | Admitting: Radiation Oncology

## 2023-06-12 DIAGNOSIS — Z191 Hormone sensitive malignancy status: Secondary | ICD-10-CM | POA: Diagnosis not present

## 2023-06-12 DIAGNOSIS — Z510 Encounter for antineoplastic radiation therapy: Secondary | ICD-10-CM | POA: Diagnosis not present

## 2023-06-12 DIAGNOSIS — C61 Malignant neoplasm of prostate: Secondary | ICD-10-CM | POA: Diagnosis not present

## 2023-06-12 LAB — RAD ONC ARIA SESSION SUMMARY
Course Elapsed Days: 20
Plan Fractions Treated to Date: 15
Plan Prescribed Dose Per Fraction: 2.075 Gy
Plan Total Fractions Prescribed: 40
Plan Total Prescribed Dose: 83 Gy
Reference Point Dosage Given to Date: 31.125 Gy
Reference Point Session Dosage Given: 2.075 Gy
Session Number: 15

## 2023-06-13 ENCOUNTER — Ambulatory Visit
Admission: RE | Admit: 2023-06-13 | Discharge: 2023-06-13 | Disposition: A | Discharge status: Home / Self Care | Payer: PPO | Source: Ambulatory Visit | Attending: Radiation Oncology | Admitting: Radiation Oncology

## 2023-06-13 ENCOUNTER — Other Ambulatory Visit: Payer: Self-pay

## 2023-06-13 DIAGNOSIS — I480 Paroxysmal atrial fibrillation: Secondary | ICD-10-CM | POA: Diagnosis not present

## 2023-06-13 DIAGNOSIS — Z79899 Other long term (current) drug therapy: Secondary | ICD-10-CM | POA: Diagnosis not present

## 2023-06-13 DIAGNOSIS — I10 Essential (primary) hypertension: Secondary | ICD-10-CM | POA: Diagnosis not present

## 2023-06-13 DIAGNOSIS — I2510 Atherosclerotic heart disease of native coronary artery without angina pectoris: Secondary | ICD-10-CM | POA: Diagnosis not present

## 2023-06-13 DIAGNOSIS — C61 Malignant neoplasm of prostate: Secondary | ICD-10-CM | POA: Diagnosis not present

## 2023-06-13 DIAGNOSIS — Z191 Hormone sensitive malignancy status: Secondary | ICD-10-CM | POA: Diagnosis not present

## 2023-06-13 DIAGNOSIS — Z0000 Encounter for general adult medical examination without abnormal findings: Secondary | ICD-10-CM | POA: Diagnosis not present

## 2023-06-13 DIAGNOSIS — Z7901 Long term (current) use of anticoagulants: Secondary | ICD-10-CM | POA: Diagnosis not present

## 2023-06-13 DIAGNOSIS — E039 Hypothyroidism, unspecified: Secondary | ICD-10-CM | POA: Diagnosis not present

## 2023-06-13 DIAGNOSIS — I5022 Chronic systolic (congestive) heart failure: Secondary | ICD-10-CM | POA: Diagnosis not present

## 2023-06-13 DIAGNOSIS — N1831 Chronic kidney disease, stage 3a: Secondary | ICD-10-CM | POA: Diagnosis not present

## 2023-06-13 DIAGNOSIS — E785 Hyperlipidemia, unspecified: Secondary | ICD-10-CM | POA: Diagnosis not present

## 2023-06-13 DIAGNOSIS — Z510 Encounter for antineoplastic radiation therapy: Secondary | ICD-10-CM | POA: Diagnosis not present

## 2023-06-13 DIAGNOSIS — I6503 Occlusion and stenosis of bilateral vertebral arteries: Secondary | ICD-10-CM | POA: Diagnosis not present

## 2023-06-13 LAB — RAD ONC ARIA SESSION SUMMARY
Course Elapsed Days: 21
Plan Fractions Treated to Date: 16
Plan Prescribed Dose Per Fraction: 2.075 Gy
Plan Total Fractions Prescribed: 40
Plan Total Prescribed Dose: 83 Gy
Reference Point Dosage Given to Date: 33.2 Gy
Reference Point Session Dosage Given: 2.075 Gy
Session Number: 16

## 2023-06-14 ENCOUNTER — Inpatient Hospital Stay: Payer: PPO

## 2023-06-14 ENCOUNTER — Other Ambulatory Visit: Payer: Self-pay

## 2023-06-14 ENCOUNTER — Ambulatory Visit
Admission: RE | Admit: 2023-06-14 | Discharge: 2023-06-14 | Disposition: A | Discharge status: Home / Self Care | Payer: PPO | Source: Ambulatory Visit | Attending: Radiation Oncology | Admitting: Radiation Oncology

## 2023-06-14 DIAGNOSIS — C61 Malignant neoplasm of prostate: Secondary | ICD-10-CM | POA: Diagnosis not present

## 2023-06-14 DIAGNOSIS — Z510 Encounter for antineoplastic radiation therapy: Secondary | ICD-10-CM | POA: Diagnosis not present

## 2023-06-14 DIAGNOSIS — Z191 Hormone sensitive malignancy status: Secondary | ICD-10-CM | POA: Diagnosis not present

## 2023-06-14 LAB — RAD ONC ARIA SESSION SUMMARY
Course Elapsed Days: 22
Plan Fractions Treated to Date: 17
Plan Prescribed Dose Per Fraction: 2.075 Gy
Plan Total Fractions Prescribed: 40
Plan Total Prescribed Dose: 83 Gy
Reference Point Dosage Given to Date: 35.275 Gy
Reference Point Session Dosage Given: 2.075 Gy
Session Number: 17

## 2023-06-15 ENCOUNTER — Ambulatory Visit
Admission: RE | Admit: 2023-06-15 | Discharge: 2023-06-15 | Disposition: A | Discharge status: Home / Self Care | Payer: PPO | Source: Ambulatory Visit | Attending: Radiation Oncology | Admitting: Radiation Oncology

## 2023-06-15 ENCOUNTER — Other Ambulatory Visit: Payer: Self-pay

## 2023-06-15 DIAGNOSIS — Z191 Hormone sensitive malignancy status: Secondary | ICD-10-CM | POA: Diagnosis not present

## 2023-06-15 DIAGNOSIS — Z510 Encounter for antineoplastic radiation therapy: Secondary | ICD-10-CM | POA: Diagnosis not present

## 2023-06-15 DIAGNOSIS — C61 Malignant neoplasm of prostate: Secondary | ICD-10-CM | POA: Diagnosis not present

## 2023-06-15 LAB — RAD ONC ARIA SESSION SUMMARY
Course Elapsed Days: 23
Plan Fractions Treated to Date: 18
Plan Prescribed Dose Per Fraction: 2.075 Gy
Plan Total Fractions Prescribed: 40
Plan Total Prescribed Dose: 83 Gy
Reference Point Dosage Given to Date: 37.35 Gy
Reference Point Session Dosage Given: 2.075 Gy
Session Number: 18

## 2023-06-16 ENCOUNTER — Ambulatory Visit
Admission: RE | Admit: 2023-06-16 | Discharge: 2023-06-16 | Disposition: A | Discharge status: Home / Self Care | Payer: PPO | Source: Ambulatory Visit | Attending: Radiation Oncology | Admitting: Radiation Oncology

## 2023-06-16 ENCOUNTER — Other Ambulatory Visit: Payer: Self-pay

## 2023-06-16 DIAGNOSIS — C61 Malignant neoplasm of prostate: Secondary | ICD-10-CM | POA: Diagnosis not present

## 2023-06-16 DIAGNOSIS — Z510 Encounter for antineoplastic radiation therapy: Secondary | ICD-10-CM | POA: Diagnosis not present

## 2023-06-16 DIAGNOSIS — Z191 Hormone sensitive malignancy status: Secondary | ICD-10-CM | POA: Diagnosis not present

## 2023-06-16 DIAGNOSIS — Z7901 Long term (current) use of anticoagulants: Secondary | ICD-10-CM | POA: Diagnosis not present

## 2023-06-16 DIAGNOSIS — I480 Paroxysmal atrial fibrillation: Secondary | ICD-10-CM | POA: Diagnosis not present

## 2023-06-16 LAB — RAD ONC ARIA SESSION SUMMARY
Course Elapsed Days: 24
Plan Fractions Treated to Date: 19
Plan Prescribed Dose Per Fraction: 2.075 Gy
Plan Total Fractions Prescribed: 40
Plan Total Prescribed Dose: 83 Gy
Reference Point Dosage Given to Date: 39.425 Gy
Reference Point Session Dosage Given: 2.075 Gy
Session Number: 19

## 2023-06-19 ENCOUNTER — Other Ambulatory Visit: Payer: Self-pay

## 2023-06-19 ENCOUNTER — Ambulatory Visit
Admission: RE | Admit: 2023-06-19 | Discharge: 2023-06-19 | Disposition: A | Discharge status: Home / Self Care | Payer: PPO | Source: Ambulatory Visit | Attending: Radiation Oncology | Admitting: Radiation Oncology

## 2023-06-19 DIAGNOSIS — C61 Malignant neoplasm of prostate: Secondary | ICD-10-CM | POA: Diagnosis not present

## 2023-06-19 DIAGNOSIS — Z510 Encounter for antineoplastic radiation therapy: Secondary | ICD-10-CM | POA: Diagnosis not present

## 2023-06-19 DIAGNOSIS — Z191 Hormone sensitive malignancy status: Secondary | ICD-10-CM | POA: Diagnosis not present

## 2023-06-19 LAB — RAD ONC ARIA SESSION SUMMARY
Course Elapsed Days: 27
Plan Fractions Treated to Date: 20
Plan Prescribed Dose Per Fraction: 2.075 Gy
Plan Total Fractions Prescribed: 40
Plan Total Prescribed Dose: 83 Gy
Reference Point Dosage Given to Date: 41.5 Gy
Reference Point Session Dosage Given: 2.075 Gy
Session Number: 20

## 2023-06-20 ENCOUNTER — Ambulatory Visit
Admission: RE | Admit: 2023-06-20 | Discharge: 2023-06-20 | Disposition: A | Discharge status: Home / Self Care | Payer: PPO | Source: Ambulatory Visit | Attending: Radiation Oncology | Admitting: Radiation Oncology

## 2023-06-20 ENCOUNTER — Other Ambulatory Visit: Payer: Self-pay

## 2023-06-20 DIAGNOSIS — C61 Malignant neoplasm of prostate: Secondary | ICD-10-CM | POA: Diagnosis not present

## 2023-06-20 DIAGNOSIS — Z510 Encounter for antineoplastic radiation therapy: Secondary | ICD-10-CM | POA: Diagnosis not present

## 2023-06-20 DIAGNOSIS — Z191 Hormone sensitive malignancy status: Secondary | ICD-10-CM | POA: Diagnosis not present

## 2023-06-20 LAB — RAD ONC ARIA SESSION SUMMARY
Course Elapsed Days: 28
Plan Fractions Treated to Date: 21
Plan Prescribed Dose Per Fraction: 2.075 Gy
Plan Total Fractions Prescribed: 40
Plan Total Prescribed Dose: 83 Gy
Reference Point Dosage Given to Date: 43.575 Gy
Reference Point Session Dosage Given: 2.075 Gy
Session Number: 21

## 2023-06-21 ENCOUNTER — Other Ambulatory Visit: Payer: Self-pay

## 2023-06-21 ENCOUNTER — Ambulatory Visit
Admission: RE | Admit: 2023-06-21 | Discharge: 2023-06-21 | Disposition: A | Discharge status: Home / Self Care | Payer: PPO | Source: Ambulatory Visit | Attending: Radiation Oncology | Admitting: Radiation Oncology

## 2023-06-21 DIAGNOSIS — Z510 Encounter for antineoplastic radiation therapy: Secondary | ICD-10-CM | POA: Diagnosis not present

## 2023-06-21 DIAGNOSIS — C61 Malignant neoplasm of prostate: Secondary | ICD-10-CM | POA: Diagnosis not present

## 2023-06-21 DIAGNOSIS — Z191 Hormone sensitive malignancy status: Secondary | ICD-10-CM | POA: Diagnosis not present

## 2023-06-21 LAB — RAD ONC ARIA SESSION SUMMARY
Course Elapsed Days: 29
Plan Fractions Treated to Date: 22
Plan Prescribed Dose Per Fraction: 2.075 Gy
Plan Total Fractions Prescribed: 40
Plan Total Prescribed Dose: 83 Gy
Reference Point Dosage Given to Date: 45.65 Gy
Reference Point Session Dosage Given: 2.075 Gy
Session Number: 22

## 2023-06-22 ENCOUNTER — Ambulatory Visit
Admission: RE | Admit: 2023-06-22 | Discharge: 2023-06-22 | Disposition: A | Discharge status: Home / Self Care | Payer: PPO | Source: Ambulatory Visit | Attending: Radiation Oncology | Admitting: Radiation Oncology

## 2023-06-22 ENCOUNTER — Other Ambulatory Visit: Payer: Self-pay

## 2023-06-22 DIAGNOSIS — Z191 Hormone sensitive malignancy status: Secondary | ICD-10-CM | POA: Diagnosis not present

## 2023-06-22 DIAGNOSIS — Z510 Encounter for antineoplastic radiation therapy: Secondary | ICD-10-CM | POA: Diagnosis not present

## 2023-06-22 DIAGNOSIS — C61 Malignant neoplasm of prostate: Secondary | ICD-10-CM | POA: Diagnosis not present

## 2023-06-22 LAB — RAD ONC ARIA SESSION SUMMARY
Course Elapsed Days: 30
Plan Fractions Treated to Date: 23
Plan Prescribed Dose Per Fraction: 2.075 Gy
Plan Total Fractions Prescribed: 40
Plan Total Prescribed Dose: 83 Gy
Reference Point Dosage Given to Date: 47.725 Gy
Reference Point Session Dosage Given: 2.075 Gy
Session Number: 23

## 2023-06-23 ENCOUNTER — Other Ambulatory Visit: Payer: Self-pay

## 2023-06-23 ENCOUNTER — Ambulatory Visit
Admission: RE | Admit: 2023-06-23 | Discharge: 2023-06-23 | Disposition: A | Discharge status: Home / Self Care | Payer: PPO | Source: Ambulatory Visit | Attending: Radiation Oncology | Admitting: Radiation Oncology

## 2023-06-23 DIAGNOSIS — Z191 Hormone sensitive malignancy status: Secondary | ICD-10-CM | POA: Diagnosis not present

## 2023-06-23 DIAGNOSIS — Z510 Encounter for antineoplastic radiation therapy: Secondary | ICD-10-CM | POA: Diagnosis not present

## 2023-06-23 DIAGNOSIS — I480 Paroxysmal atrial fibrillation: Secondary | ICD-10-CM | POA: Diagnosis not present

## 2023-06-23 DIAGNOSIS — Z7901 Long term (current) use of anticoagulants: Secondary | ICD-10-CM | POA: Diagnosis not present

## 2023-06-23 DIAGNOSIS — C61 Malignant neoplasm of prostate: Secondary | ICD-10-CM | POA: Diagnosis not present

## 2023-06-23 LAB — RAD ONC ARIA SESSION SUMMARY
Course Elapsed Days: 31
Plan Fractions Treated to Date: 24
Plan Prescribed Dose Per Fraction: 2.075 Gy
Plan Total Fractions Prescribed: 40
Plan Total Prescribed Dose: 83 Gy
Reference Point Dosage Given to Date: 49.8 Gy
Reference Point Session Dosage Given: 2.075 Gy
Session Number: 24

## 2023-06-26 ENCOUNTER — Ambulatory Visit
Admission: RE | Admit: 2023-06-26 | Discharge: 2023-06-26 | Disposition: A | Discharge status: Home / Self Care | Payer: PPO | Source: Ambulatory Visit | Attending: Radiation Oncology | Admitting: Radiation Oncology

## 2023-06-26 ENCOUNTER — Other Ambulatory Visit: Payer: Self-pay

## 2023-06-26 DIAGNOSIS — Z191 Hormone sensitive malignancy status: Secondary | ICD-10-CM | POA: Diagnosis not present

## 2023-06-26 DIAGNOSIS — Z510 Encounter for antineoplastic radiation therapy: Secondary | ICD-10-CM | POA: Diagnosis not present

## 2023-06-26 DIAGNOSIS — C61 Malignant neoplasm of prostate: Secondary | ICD-10-CM | POA: Diagnosis not present

## 2023-06-26 LAB — RAD ONC ARIA SESSION SUMMARY
Course Elapsed Days: 34
Plan Fractions Treated to Date: 25
Plan Prescribed Dose Per Fraction: 2.075 Gy
Plan Total Fractions Prescribed: 40
Plan Total Prescribed Dose: 83 Gy
Reference Point Dosage Given to Date: 51.875 Gy
Reference Point Session Dosage Given: 2.075 Gy
Session Number: 25

## 2023-06-27 ENCOUNTER — Ambulatory Visit
Admission: RE | Admit: 2023-06-27 | Discharge: 2023-06-27 | Disposition: A | Discharge status: Home / Self Care | Payer: PPO | Source: Ambulatory Visit | Attending: Radiation Oncology | Admitting: Radiation Oncology

## 2023-06-27 ENCOUNTER — Other Ambulatory Visit: Payer: Self-pay

## 2023-06-27 DIAGNOSIS — I2510 Atherosclerotic heart disease of native coronary artery without angina pectoris: Secondary | ICD-10-CM | POA: Diagnosis not present

## 2023-06-27 DIAGNOSIS — I252 Old myocardial infarction: Secondary | ICD-10-CM | POA: Diagnosis not present

## 2023-06-27 DIAGNOSIS — I739 Peripheral vascular disease, unspecified: Secondary | ICD-10-CM | POA: Diagnosis not present

## 2023-06-27 DIAGNOSIS — I5040 Unspecified combined systolic (congestive) and diastolic (congestive) heart failure: Secondary | ICD-10-CM | POA: Diagnosis not present

## 2023-06-27 DIAGNOSIS — G40909 Epilepsy, unspecified, not intractable, without status epilepticus: Secondary | ICD-10-CM | POA: Diagnosis not present

## 2023-06-27 DIAGNOSIS — N183 Chronic kidney disease, stage 3 unspecified: Secondary | ICD-10-CM | POA: Insufficient documentation

## 2023-06-27 DIAGNOSIS — E538 Deficiency of other specified B group vitamins: Secondary | ICD-10-CM | POA: Diagnosis not present

## 2023-06-27 DIAGNOSIS — Z95810 Presence of automatic (implantable) cardiac defibrillator: Secondary | ICD-10-CM | POA: Diagnosis not present

## 2023-06-27 DIAGNOSIS — Z7901 Long term (current) use of anticoagulants: Secondary | ICD-10-CM | POA: Insufficient documentation

## 2023-06-27 DIAGNOSIS — Z79899 Other long term (current) drug therapy: Secondary | ICD-10-CM | POA: Diagnosis not present

## 2023-06-27 DIAGNOSIS — I420 Dilated cardiomyopathy: Secondary | ICD-10-CM | POA: Insufficient documentation

## 2023-06-27 DIAGNOSIS — I130 Hypertensive heart and chronic kidney disease with heart failure and stage 1 through stage 4 chronic kidney disease, or unspecified chronic kidney disease: Secondary | ICD-10-CM | POA: Insufficient documentation

## 2023-06-27 DIAGNOSIS — Z510 Encounter for antineoplastic radiation therapy: Secondary | ICD-10-CM | POA: Diagnosis not present

## 2023-06-27 DIAGNOSIS — Z191 Hormone sensitive malignancy status: Secondary | ICD-10-CM | POA: Diagnosis not present

## 2023-06-27 DIAGNOSIS — E785 Hyperlipidemia, unspecified: Secondary | ICD-10-CM | POA: Insufficient documentation

## 2023-06-27 DIAGNOSIS — K5900 Constipation, unspecified: Secondary | ICD-10-CM | POA: Insufficient documentation

## 2023-06-27 DIAGNOSIS — Z8042 Family history of malignant neoplasm of prostate: Secondary | ICD-10-CM | POA: Insufficient documentation

## 2023-06-27 DIAGNOSIS — K219 Gastro-esophageal reflux disease without esophagitis: Secondary | ICD-10-CM | POA: Insufficient documentation

## 2023-06-27 DIAGNOSIS — Z86711 Personal history of pulmonary embolism: Secondary | ICD-10-CM | POA: Diagnosis not present

## 2023-06-27 DIAGNOSIS — C61 Malignant neoplasm of prostate: Secondary | ICD-10-CM | POA: Insufficient documentation

## 2023-06-27 DIAGNOSIS — I480 Paroxysmal atrial fibrillation: Secondary | ICD-10-CM | POA: Diagnosis not present

## 2023-06-27 LAB — RAD ONC ARIA SESSION SUMMARY
Course Elapsed Days: 35
Plan Fractions Treated to Date: 26
Plan Prescribed Dose Per Fraction: 2.075 Gy
Plan Total Fractions Prescribed: 40
Plan Total Prescribed Dose: 83 Gy
Reference Point Dosage Given to Date: 53.95 Gy
Reference Point Session Dosage Given: 2.075 Gy
Session Number: 26

## 2023-06-28 ENCOUNTER — Other Ambulatory Visit: Payer: Self-pay

## 2023-06-28 ENCOUNTER — Inpatient Hospital Stay: Payer: PPO

## 2023-06-28 ENCOUNTER — Ambulatory Visit
Admission: RE | Admit: 2023-06-28 | Discharge: 2023-06-28 | Disposition: A | Discharge status: Home / Self Care | Payer: PPO | Source: Ambulatory Visit | Attending: Radiation Oncology | Admitting: Radiation Oncology

## 2023-06-28 DIAGNOSIS — Z510 Encounter for antineoplastic radiation therapy: Secondary | ICD-10-CM | POA: Diagnosis not present

## 2023-06-28 DIAGNOSIS — Z191 Hormone sensitive malignancy status: Secondary | ICD-10-CM | POA: Diagnosis not present

## 2023-06-28 DIAGNOSIS — C61 Malignant neoplasm of prostate: Secondary | ICD-10-CM | POA: Insufficient documentation

## 2023-06-28 LAB — CBC (CANCER CENTER ONLY)
HCT: 36.7 % — ABNORMAL LOW (ref 39.0–52.0)
Hemoglobin: 12.6 g/dL — ABNORMAL LOW (ref 13.0–17.0)
MCH: 35.2 pg — ABNORMAL HIGH (ref 26.0–34.0)
MCHC: 34.3 g/dL (ref 30.0–36.0)
MCV: 102.5 fL — ABNORMAL HIGH (ref 80.0–100.0)
Platelet Count: 196 10*3/uL (ref 150–400)
RBC: 3.58 MIL/uL — ABNORMAL LOW (ref 4.22–5.81)
RDW: 14.2 % (ref 11.5–15.5)
WBC Count: 6 10*3/uL (ref 4.0–10.5)
nRBC: 0 % (ref 0.0–0.2)

## 2023-06-28 LAB — RAD ONC ARIA SESSION SUMMARY
Course Elapsed Days: 36
Plan Fractions Treated to Date: 27
Plan Prescribed Dose Per Fraction: 2.075 Gy
Plan Total Fractions Prescribed: 40
Plan Total Prescribed Dose: 83 Gy
Reference Point Dosage Given to Date: 56.025 Gy
Reference Point Session Dosage Given: 2.075 Gy
Session Number: 27

## 2023-06-29 ENCOUNTER — Other Ambulatory Visit: Payer: Self-pay

## 2023-06-29 ENCOUNTER — Ambulatory Visit
Admission: RE | Admit: 2023-06-29 | Discharge: 2023-06-29 | Disposition: A | Discharge status: Home / Self Care | Payer: PPO | Source: Ambulatory Visit | Attending: Radiation Oncology | Admitting: Radiation Oncology

## 2023-06-29 DIAGNOSIS — Z510 Encounter for antineoplastic radiation therapy: Secondary | ICD-10-CM | POA: Diagnosis not present

## 2023-06-29 DIAGNOSIS — C61 Malignant neoplasm of prostate: Secondary | ICD-10-CM | POA: Diagnosis not present

## 2023-06-29 DIAGNOSIS — Z191 Hormone sensitive malignancy status: Secondary | ICD-10-CM | POA: Diagnosis not present

## 2023-06-29 LAB — RAD ONC ARIA SESSION SUMMARY
Course Elapsed Days: 37
Plan Fractions Treated to Date: 28
Plan Prescribed Dose Per Fraction: 2.075 Gy
Plan Total Fractions Prescribed: 40
Plan Total Prescribed Dose: 83 Gy
Reference Point Dosage Given to Date: 58.1 Gy
Reference Point Session Dosage Given: 2.075 Gy
Session Number: 28
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
## 2023-06-30 ENCOUNTER — Other Ambulatory Visit: Payer: Self-pay

## 2023-06-30 ENCOUNTER — Ambulatory Visit
Admission: RE | Admit: 2023-06-30 | Discharge: 2023-06-30 | Disposition: A | Discharge status: Home / Self Care | Payer: PPO | Source: Ambulatory Visit | Attending: Radiation Oncology | Admitting: Radiation Oncology

## 2023-06-30 DIAGNOSIS — Z510 Encounter for antineoplastic radiation therapy: Secondary | ICD-10-CM | POA: Diagnosis not present

## 2023-06-30 DIAGNOSIS — C61 Malignant neoplasm of prostate: Secondary | ICD-10-CM | POA: Diagnosis not present

## 2023-06-30 DIAGNOSIS — Z191 Hormone sensitive malignancy status: Secondary | ICD-10-CM | POA: Diagnosis not present

## 2023-06-30 LAB — RAD ONC ARIA SESSION SUMMARY
Course Elapsed Days: 38
Plan Fractions Treated to Date: 29
Plan Prescribed Dose Per Fraction: 2.075 Gy
Plan Total Fractions Prescribed: 40
Plan Total Prescribed Dose: 83 Gy
Reference Point Dosage Given to Date: 60.175 Gy
Reference Point Session Dosage Given: 2.075 Gy
Session Number: 29

## 2023-07-03 ENCOUNTER — Ambulatory Visit
Admission: RE | Admit: 2023-07-03 | Discharge: 2023-07-03 | Disposition: A | Discharge status: Home / Self Care | Payer: PPO | Source: Ambulatory Visit | Attending: Radiation Oncology | Admitting: Radiation Oncology

## 2023-07-03 ENCOUNTER — Other Ambulatory Visit: Payer: Self-pay

## 2023-07-03 DIAGNOSIS — Z191 Hormone sensitive malignancy status: Secondary | ICD-10-CM | POA: Diagnosis not present

## 2023-07-03 DIAGNOSIS — Z510 Encounter for antineoplastic radiation therapy: Secondary | ICD-10-CM | POA: Diagnosis not present

## 2023-07-03 DIAGNOSIS — C61 Malignant neoplasm of prostate: Secondary | ICD-10-CM | POA: Diagnosis not present

## 2023-07-03 LAB — RAD ONC ARIA SESSION SUMMARY
Course Elapsed Days: 41
Plan Fractions Treated to Date: 30
Plan Prescribed Dose Per Fraction: 2.075 Gy
Plan Total Fractions Prescribed: 40
Plan Total Prescribed Dose: 83 Gy
Reference Point Dosage Given to Date: 62.25 Gy
Reference Point Session Dosage Given: 2.075 Gy
Session Number: 30

## 2023-07-04 ENCOUNTER — Other Ambulatory Visit: Payer: Self-pay

## 2023-07-04 ENCOUNTER — Ambulatory Visit
Admission: RE | Admit: 2023-07-04 | Discharge: 2023-07-04 | Disposition: A | Discharge status: Home / Self Care | Payer: PPO | Source: Ambulatory Visit | Attending: Radiation Oncology | Admitting: Radiation Oncology

## 2023-07-04 DIAGNOSIS — Z191 Hormone sensitive malignancy status: Secondary | ICD-10-CM | POA: Diagnosis not present

## 2023-07-04 DIAGNOSIS — Z510 Encounter for antineoplastic radiation therapy: Secondary | ICD-10-CM | POA: Diagnosis not present

## 2023-07-04 DIAGNOSIS — C61 Malignant neoplasm of prostate: Secondary | ICD-10-CM | POA: Diagnosis not present

## 2023-07-04 LAB — RAD ONC ARIA SESSION SUMMARY
Course Elapsed Days: 42
Plan Fractions Treated to Date: 31
Plan Prescribed Dose Per Fraction: 2.075 Gy
Plan Total Fractions Prescribed: 40
Plan Total Prescribed Dose: 83 Gy
Reference Point Dosage Given to Date: 64.325 Gy
Reference Point Session Dosage Given: 2.075 Gy
Session Number: 31

## 2023-07-05 ENCOUNTER — Ambulatory Visit
Admission: RE | Admit: 2023-07-05 | Discharge: 2023-07-05 | Disposition: A | Discharge status: Home / Self Care | Payer: PPO | Source: Ambulatory Visit | Attending: Radiation Oncology | Admitting: Radiation Oncology

## 2023-07-05 ENCOUNTER — Other Ambulatory Visit: Payer: Self-pay

## 2023-07-05 DIAGNOSIS — I6503 Occlusion and stenosis of bilateral vertebral arteries: Secondary | ICD-10-CM | POA: Diagnosis not present

## 2023-07-05 DIAGNOSIS — Z191 Hormone sensitive malignancy status: Secondary | ICD-10-CM | POA: Diagnosis not present

## 2023-07-05 DIAGNOSIS — Z8673 Personal history of transient ischemic attack (TIA), and cerebral infarction without residual deficits: Secondary | ICD-10-CM | POA: Diagnosis not present

## 2023-07-05 DIAGNOSIS — C61 Malignant neoplasm of prostate: Secondary | ICD-10-CM | POA: Diagnosis not present

## 2023-07-05 DIAGNOSIS — Z510 Encounter for antineoplastic radiation therapy: Secondary | ICD-10-CM | POA: Diagnosis not present

## 2023-07-05 DIAGNOSIS — I480 Paroxysmal atrial fibrillation: Secondary | ICD-10-CM | POA: Diagnosis not present

## 2023-07-05 LAB — RAD ONC ARIA SESSION SUMMARY
Course Elapsed Days: 43
Plan Fractions Treated to Date: 32
Plan Prescribed Dose Per Fraction: 2.075 Gy
Plan Total Fractions Prescribed: 40
Plan Total Prescribed Dose: 83 Gy
Reference Point Dosage Given to Date: 66.4 Gy
Reference Point Session Dosage Given: 2.075 Gy
Session Number: 32

## 2023-07-06 ENCOUNTER — Ambulatory Visit: Payer: PPO

## 2023-07-06 ENCOUNTER — Other Ambulatory Visit: Payer: Self-pay

## 2023-07-06 DIAGNOSIS — Z510 Encounter for antineoplastic radiation therapy: Secondary | ICD-10-CM | POA: Diagnosis not present

## 2023-07-06 DIAGNOSIS — C61 Malignant neoplasm of prostate: Secondary | ICD-10-CM | POA: Diagnosis not present

## 2023-07-06 DIAGNOSIS — Z191 Hormone sensitive malignancy status: Secondary | ICD-10-CM | POA: Diagnosis not present

## 2023-07-06 LAB — RAD ONC ARIA SESSION SUMMARY
Course Elapsed Days: 44
Plan Fractions Treated to Date: 33
Plan Prescribed Dose Per Fraction: 2.075 Gy
Plan Total Fractions Prescribed: 40
Plan Total Prescribed Dose: 83 Gy
Reference Point Dosage Given to Date: 68.475 Gy
Reference Point Session Dosage Given: 2.075 Gy
Session Number: 33

## 2023-07-07 ENCOUNTER — Other Ambulatory Visit: Payer: Self-pay

## 2023-07-07 ENCOUNTER — Ambulatory Visit
Admission: RE | Admit: 2023-07-07 | Discharge: 2023-07-07 | Disposition: A | Discharge status: Home / Self Care | Payer: PPO | Source: Ambulatory Visit | Attending: Radiation Oncology | Admitting: Radiation Oncology

## 2023-07-07 DIAGNOSIS — Z510 Encounter for antineoplastic radiation therapy: Secondary | ICD-10-CM | POA: Diagnosis not present

## 2023-07-07 DIAGNOSIS — C61 Malignant neoplasm of prostate: Secondary | ICD-10-CM | POA: Diagnosis not present

## 2023-07-07 DIAGNOSIS — Z191 Hormone sensitive malignancy status: Secondary | ICD-10-CM | POA: Diagnosis not present

## 2023-07-07 LAB — RAD ONC ARIA SESSION SUMMARY
Course Elapsed Days: 45
Plan Fractions Treated to Date: 34
Plan Prescribed Dose Per Fraction: 2.075 Gy
Plan Total Fractions Prescribed: 40
Plan Total Prescribed Dose: 83 Gy
Reference Point Dosage Given to Date: 70.55 Gy
Reference Point Session Dosage Given: 2.075 Gy
Session Number: 34

## 2023-07-10 ENCOUNTER — Ambulatory Visit
Admission: RE | Admit: 2023-07-10 | Discharge: 2023-07-10 | Disposition: A | Discharge status: Home / Self Care | Payer: PPO | Source: Ambulatory Visit | Attending: Radiation Oncology | Admitting: Radiation Oncology

## 2023-07-10 ENCOUNTER — Other Ambulatory Visit: Payer: Self-pay

## 2023-07-10 DIAGNOSIS — Z510 Encounter for antineoplastic radiation therapy: Secondary | ICD-10-CM | POA: Diagnosis not present

## 2023-07-10 DIAGNOSIS — Z191 Hormone sensitive malignancy status: Secondary | ICD-10-CM | POA: Diagnosis not present

## 2023-07-10 DIAGNOSIS — C61 Malignant neoplasm of prostate: Secondary | ICD-10-CM | POA: Diagnosis not present

## 2023-07-10 LAB — RAD ONC ARIA SESSION SUMMARY
Course Elapsed Days: 48
Plan Fractions Treated to Date: 35
Plan Prescribed Dose Per Fraction: 2.075 Gy
Plan Total Fractions Prescribed: 40
Plan Total Prescribed Dose: 83 Gy
Reference Point Dosage Given to Date: 72.625 Gy
Reference Point Session Dosage Given: 2.075 Gy
Session Number: 35

## 2023-07-11 ENCOUNTER — Ambulatory Visit
Admission: RE | Admit: 2023-07-11 | Discharge: 2023-07-11 | Disposition: A | Discharge status: Home / Self Care | Payer: PPO | Source: Ambulatory Visit | Attending: Radiation Oncology | Admitting: Radiation Oncology

## 2023-07-11 ENCOUNTER — Other Ambulatory Visit: Payer: Self-pay

## 2023-07-11 DIAGNOSIS — Z510 Encounter for antineoplastic radiation therapy: Secondary | ICD-10-CM | POA: Diagnosis not present

## 2023-07-11 DIAGNOSIS — C61 Malignant neoplasm of prostate: Secondary | ICD-10-CM | POA: Diagnosis not present

## 2023-07-11 DIAGNOSIS — Z191 Hormone sensitive malignancy status: Secondary | ICD-10-CM | POA: Diagnosis not present

## 2023-07-11 LAB — RAD ONC ARIA SESSION SUMMARY
Course Elapsed Days: 49
Plan Fractions Treated to Date: 36
Plan Prescribed Dose Per Fraction: 2.075 Gy
Plan Total Fractions Prescribed: 40
Plan Total Prescribed Dose: 83 Gy
Reference Point Dosage Given to Date: 74.7 Gy
Reference Point Session Dosage Given: 2.075 Gy
Session Number: 36

## 2023-07-12 ENCOUNTER — Other Ambulatory Visit: Payer: Self-pay

## 2023-07-12 ENCOUNTER — Ambulatory Visit
Admission: RE | Admit: 2023-07-12 | Discharge: 2023-07-12 | Disposition: A | Discharge status: Home / Self Care | Payer: PPO | Source: Ambulatory Visit | Attending: Radiation Oncology | Admitting: Radiation Oncology

## 2023-07-12 ENCOUNTER — Inpatient Hospital Stay: Payer: PPO

## 2023-07-12 DIAGNOSIS — C61 Malignant neoplasm of prostate: Secondary | ICD-10-CM

## 2023-07-12 DIAGNOSIS — Z510 Encounter for antineoplastic radiation therapy: Secondary | ICD-10-CM | POA: Diagnosis not present

## 2023-07-12 DIAGNOSIS — Z191 Hormone sensitive malignancy status: Secondary | ICD-10-CM | POA: Diagnosis not present

## 2023-07-12 LAB — CBC (CANCER CENTER ONLY)
HCT: 35.5 % — ABNORMAL LOW (ref 39.0–52.0)
Hemoglobin: 12.3 g/dL — ABNORMAL LOW (ref 13.0–17.0)
MCH: 35.2 pg — ABNORMAL HIGH (ref 26.0–34.0)
MCHC: 34.6 g/dL (ref 30.0–36.0)
MCV: 101.7 fL — ABNORMAL HIGH (ref 80.0–100.0)
Platelet Count: 176 10*3/uL (ref 150–400)
RBC: 3.49 MIL/uL — ABNORMAL LOW (ref 4.22–5.81)
RDW: 14.5 % (ref 11.5–15.5)
WBC Count: 5.8 10*3/uL (ref 4.0–10.5)
nRBC: 0 % (ref 0.0–0.2)

## 2023-07-12 LAB — RAD ONC ARIA SESSION SUMMARY
Course Elapsed Days: 50
Plan Fractions Treated to Date: 37
Plan Prescribed Dose Per Fraction: 2.075 Gy
Plan Total Fractions Prescribed: 40
Plan Total Prescribed Dose: 83 Gy
Reference Point Dosage Given to Date: 76.775 Gy
Reference Point Session Dosage Given: 2.075 Gy
Session Number: 37

## 2023-07-13 ENCOUNTER — Other Ambulatory Visit: Payer: Self-pay

## 2023-07-13 ENCOUNTER — Ambulatory Visit
Admission: RE | Admit: 2023-07-13 | Discharge: 2023-07-13 | Disposition: A | Discharge status: Home / Self Care | Payer: PPO | Source: Ambulatory Visit | Attending: Radiation Oncology | Admitting: Radiation Oncology

## 2023-07-13 DIAGNOSIS — Z510 Encounter for antineoplastic radiation therapy: Secondary | ICD-10-CM | POA: Diagnosis not present

## 2023-07-13 DIAGNOSIS — C61 Malignant neoplasm of prostate: Secondary | ICD-10-CM | POA: Diagnosis not present

## 2023-07-13 DIAGNOSIS — Z191 Hormone sensitive malignancy status: Secondary | ICD-10-CM | POA: Diagnosis not present

## 2023-07-13 LAB — RAD ONC ARIA SESSION SUMMARY
Course Elapsed Days: 51
Plan Fractions Treated to Date: 38
Plan Prescribed Dose Per Fraction: 2.075 Gy
Plan Total Fractions Prescribed: 40
Plan Total Prescribed Dose: 83 Gy
Reference Point Dosage Given to Date: 78.85 Gy
Reference Point Session Dosage Given: 2.075 Gy
Session Number: 38

## 2023-07-14 ENCOUNTER — Other Ambulatory Visit: Payer: Self-pay

## 2023-07-14 ENCOUNTER — Ambulatory Visit
Admission: RE | Admit: 2023-07-14 | Discharge: 2023-07-14 | Disposition: A | Discharge status: Home / Self Care | Payer: PPO | Source: Ambulatory Visit | Attending: Radiation Oncology | Admitting: Radiation Oncology

## 2023-07-14 DIAGNOSIS — C61 Malignant neoplasm of prostate: Secondary | ICD-10-CM | POA: Diagnosis not present

## 2023-07-14 DIAGNOSIS — Z510 Encounter for antineoplastic radiation therapy: Secondary | ICD-10-CM | POA: Diagnosis not present

## 2023-07-14 DIAGNOSIS — Z191 Hormone sensitive malignancy status: Secondary | ICD-10-CM | POA: Diagnosis not present

## 2023-07-14 LAB — RAD ONC ARIA SESSION SUMMARY
Course Elapsed Days: 52
Plan Fractions Treated to Date: 39
Plan Prescribed Dose Per Fraction: 2.075 Gy
Plan Total Fractions Prescribed: 40
Plan Total Prescribed Dose: 83 Gy
Reference Point Dosage Given to Date: 80.925 Gy
Reference Point Session Dosage Given: 2.075 Gy
Session Number: 39

## 2023-07-17 ENCOUNTER — Other Ambulatory Visit: Payer: Self-pay

## 2023-07-17 ENCOUNTER — Ambulatory Visit
Admission: RE | Admit: 2023-07-17 | Discharge: 2023-07-17 | Disposition: A | Discharge status: Home / Self Care | Payer: PPO | Source: Ambulatory Visit | Attending: Radiation Oncology | Admitting: Radiation Oncology

## 2023-07-17 DIAGNOSIS — Z191 Hormone sensitive malignancy status: Secondary | ICD-10-CM | POA: Diagnosis not present

## 2023-07-17 DIAGNOSIS — I480 Paroxysmal atrial fibrillation: Secondary | ICD-10-CM | POA: Diagnosis not present

## 2023-07-17 DIAGNOSIS — C61 Malignant neoplasm of prostate: Secondary | ICD-10-CM | POA: Diagnosis not present

## 2023-07-17 DIAGNOSIS — Z5181 Encounter for therapeutic drug level monitoring: Secondary | ICD-10-CM | POA: Diagnosis not present

## 2023-07-17 DIAGNOSIS — Z510 Encounter for antineoplastic radiation therapy: Secondary | ICD-10-CM | POA: Diagnosis not present

## 2023-07-17 DIAGNOSIS — Z7901 Long term (current) use of anticoagulants: Secondary | ICD-10-CM | POA: Diagnosis not present

## 2023-07-17 LAB — RAD ONC ARIA SESSION SUMMARY
Course Elapsed Days: 55
Plan Fractions Treated to Date: 40
Plan Prescribed Dose Per Fraction: 2.075 Gy
Plan Total Fractions Prescribed: 40
Plan Total Prescribed Dose: 83 Gy
Reference Point Dosage Given to Date: 83 Gy
Reference Point Session Dosage Given: 2.075 Gy
Session Number: 40

## 2023-07-18 NOTE — Radiation Completion Notes (Signed)
 Patient Name: Jerry English, Jerry English MRN: [id number] Date of Birth: [birth year] Referring Physician: Darlynn Elam, M.D. Date of Service: 2023-07-18 Radiation Oncologist: Glenis Langdon, M.D. [facility]                             RADIATION ONCOLOGY END OF TREATMENT NOTE     Diagnosis: C61 Malignant neoplasm of prostate Intent: Curative     HPI: Patient is an 83 year old male who is noted a gradual rise in his PSA most recently up to 9.3.  This prompted prostate biopsy showing 6 of 12 cores positive for mixture of mostly Gleason 8 (4+4) adenocarcinoma as well as Gleason 6 (3+3 adenocarcinoma.  Patient is fairly asymptomatic specifically denies urinary frequency urgency nocturia.  His bowels tend towards constipation he is having no bone pain.  He is now referred to radiation oncology for opinion.  Patient does have a pacemaker and defibrillator implanted      ==========DELIVERED PLANS==========  First Treatment Date: 2023-05-23 Last Treatment Date: 2023-07-17   Plan Name: Prostate Site: Prostate Technique: IMRT Mode: Photon Dose Per Fraction: 2.08 Gy Prescribed Dose (Delivered / Prescribed): 83 Gy / 83 Gy Prescribed Fxs (Delivered / Prescribed): 40 / 40     ==========ON TREATMENT VISIT DATES========== 2023-05-23, 2023-05-30, 2023-06-06, 2023-06-13, 2023-06-20, 2023-06-27, 2023-07-04, 2023-07-11     ==========UPCOMING VISITS========== 10/16/2023 [facility] NEW PATIENT 30 Lisabeth Rider, MD  08/14/2023 [facility] RAD ONCOLOGY FOLLOW UP 30 Glenis Langdon, MD        ==========APPENDIX - ON TREATMENT VISIT NOTES==========   See weekly On Treatment Notes in Epic for details in the Media tab (listed as Progress notes on the On Treatment Visit Dates listed above).

## 2023-08-06 DIAGNOSIS — M5416 Radiculopathy, lumbar region: Secondary | ICD-10-CM | POA: Diagnosis not present

## 2023-08-14 ENCOUNTER — Ambulatory Visit
Admission: RE | Admit: 2023-08-14 | Discharge: 2023-08-14 | Disposition: A | Discharge status: Home / Self Care | Source: Ambulatory Visit | Attending: Radiation Oncology | Admitting: Radiation Oncology

## 2023-08-14 ENCOUNTER — Encounter: Payer: Self-pay | Admitting: Radiation Oncology

## 2023-08-14 ENCOUNTER — Other Ambulatory Visit: Payer: Self-pay | Admitting: *Deleted

## 2023-08-14 VITALS — BP 117/79 | HR 71 | Temp 97.0°F | Resp 16 | Wt 199.0 lb

## 2023-08-14 DIAGNOSIS — C61 Malignant neoplasm of prostate: Secondary | ICD-10-CM | POA: Diagnosis not present

## 2023-08-14 NOTE — Progress Notes (Signed)
 Radiation Oncology Follow up Note  Name: Jerry English   Date:   08/14/2023 MRN:  [id number] DOB: [birth year]    This 83 y.o. male presents to the clinic today for 1 month follow-up status post image guided IMRT radiation therapy for stage IIc (cT1 cN0 M0) Gleason 8 (4+4) adenocarcinoma the prostate presenting with a PSA of 9.3.  REFERRING PROVIDER: Eartha Gold, MD  HPI: Patient is an 83 year old male now out 1 month having completed image guided IMRT radiation therapy for Gleason 8 adenocarcinoma the prostate presents with a PSA of 9.3.  Seen today in routine follow-up he is doing well specifically denies any increased lower urinary tract symptoms diarrhea or fatigue.  He has had a extremely low side effect profile..  COMPLICATIONS OF TREATMENT: none  FOLLOW UP COMPLIANCE: keeps appointments   PHYSICAL EXAM:  BP 117/79   Pulse 71   Temp (!) 97 F (36.1 C)   Resp 16   Wt 199 lb (90.3 kg)   BMI 26.25 kg/m  Well-developed well-nourished patient in NAD. HEENT reveals PERLA, EOMI, discs not visualized.  Oral cavity is clear. No oral mucosal lesions are identified. Neck is clear without evidence of cervical or supraclavicular adenopathy. Lungs are clear to A&P. Cardiac examination is essentially unremarkable with regular rate and rhythm without murmur rub or thrill. Abdomen is benign with no organomegaly or masses noted. Motor sensory and DTR levels are equal and symmetric in the upper and lower extremities. Cranial nerves II through XII are grossly intact. Proprioception is intact. No peripheral adenopathy or edema is identified. No motor or sensory levels are noted. Crude visual fields are within normal range.  RADIOLOGY RESULTS: No current films for review  PLAN: Present time patient has extremely low side effect profile from his radiation therapy.  And pleased with his overall progress.  I have asked to see him back in 3 months for follow-up with repeat PSA.  Will discuss  continuation based on his Gleason 8 score of ADT therapy with urology.  Patient knows to call with any concerns.  I would like to take this opportunity to thank you for allowing me to participate in the care of your patient.Glenis Langdon, MD

## 2023-08-16 DIAGNOSIS — I420 Dilated cardiomyopathy: Secondary | ICD-10-CM | POA: Diagnosis not present

## 2023-08-16 DIAGNOSIS — Z7901 Long term (current) use of anticoagulants: Secondary | ICD-10-CM | POA: Diagnosis not present

## 2023-08-16 DIAGNOSIS — Z950 Presence of cardiac pacemaker: Secondary | ICD-10-CM | POA: Diagnosis not present

## 2023-08-16 DIAGNOSIS — Z5181 Encounter for therapeutic drug level monitoring: Secondary | ICD-10-CM | POA: Diagnosis not present

## 2023-08-16 DIAGNOSIS — I480 Paroxysmal atrial fibrillation: Secondary | ICD-10-CM | POA: Diagnosis not present

## 2023-09-11 DIAGNOSIS — I480 Paroxysmal atrial fibrillation: Secondary | ICD-10-CM | POA: Diagnosis not present

## 2023-09-20 DIAGNOSIS — Z79899 Other long term (current) drug therapy: Secondary | ICD-10-CM | POA: Diagnosis not present

## 2023-09-20 DIAGNOSIS — E785 Hyperlipidemia, unspecified: Secondary | ICD-10-CM | POA: Diagnosis not present

## 2023-09-20 DIAGNOSIS — R0989 Other specified symptoms and signs involving the circulatory and respiratory systems: Secondary | ICD-10-CM | POA: Diagnosis not present

## 2023-09-20 DIAGNOSIS — I480 Paroxysmal atrial fibrillation: Secondary | ICD-10-CM | POA: Diagnosis not present

## 2023-09-20 DIAGNOSIS — I420 Dilated cardiomyopathy: Secondary | ICD-10-CM | POA: Diagnosis not present

## 2023-09-20 DIAGNOSIS — Z95810 Presence of automatic (implantable) cardiac defibrillator: Secondary | ICD-10-CM | POA: Diagnosis not present

## 2023-09-20 DIAGNOSIS — N1831 Chronic kidney disease, stage 3a: Secondary | ICD-10-CM | POA: Diagnosis not present

## 2023-09-20 DIAGNOSIS — I2510 Atherosclerotic heart disease of native coronary artery without angina pectoris: Secondary | ICD-10-CM | POA: Diagnosis not present

## 2023-09-20 DIAGNOSIS — K219 Gastro-esophageal reflux disease without esophagitis: Secondary | ICD-10-CM | POA: Diagnosis not present

## 2023-09-20 DIAGNOSIS — I10 Essential (primary) hypertension: Secondary | ICD-10-CM | POA: Diagnosis not present

## 2023-09-20 DIAGNOSIS — I5022 Chronic systolic (congestive) heart failure: Secondary | ICD-10-CM | POA: Diagnosis not present

## 2023-09-20 DIAGNOSIS — R42 Dizziness and giddiness: Secondary | ICD-10-CM | POA: Diagnosis not present

## 2023-10-13 DIAGNOSIS — Z5181 Encounter for therapeutic drug level monitoring: Secondary | ICD-10-CM | POA: Diagnosis not present

## 2023-10-13 DIAGNOSIS — I480 Paroxysmal atrial fibrillation: Secondary | ICD-10-CM | POA: Diagnosis not present

## 2023-10-13 DIAGNOSIS — Z7901 Long term (current) use of anticoagulants: Secondary | ICD-10-CM | POA: Diagnosis not present

## 2023-10-16 ENCOUNTER — Ambulatory Visit: Admitting: Neurology

## 2023-10-16 ENCOUNTER — Encounter: Payer: Self-pay | Admitting: Neurology

## 2023-10-16 VITALS — BP 131/82 | HR 67 | Ht 73.0 in | Wt 207.0 lb

## 2023-10-16 DIAGNOSIS — I639 Cerebral infarction, unspecified: Secondary | ICD-10-CM

## 2023-10-16 DIAGNOSIS — I6502 Occlusion and stenosis of left vertebral artery: Secondary | ICD-10-CM | POA: Diagnosis not present

## 2023-10-16 NOTE — Patient Instructions (Signed)
 I had a long d/w patient and his wife about his abnormal CT scan and CT angiogram findings risk for recurrent stroke/TIAs, personally independently reviewed imaging studies and stroke evaluation results and answered questions.Continue warfarin daily  for secondary stroke prevention and maintain strict control of hypertension with blood pressure goal below 130/90, diabetes with hemoglobin A1c goal below 6.5% and lipids with LDL cholesterol goal below 70 mg/dL. I also advised the patient to eat a healthy diet with plenty of whole grains, cereals, fruits and vegetables, exercise regularly and maintain ideal body weight .plan to check diagnostic cerebral catheter angiogram to evaluate left vertebral origin stenosis and if significant may consider elective angioplasty stenting.  Followup in the future with me in 6 months or earlier if necessary.  Stroke Prevention Some medical conditions and behaviors can lead to a higher chance of having a stroke. You can help prevent a stroke by eating healthy, exercising, not smoking, and managing any medical conditions you have. Stroke is a leading cause of functional impairment. Primary prevention is particularly important because a majority of strokes are first-time events. Stroke changes the lives of not only those who experience a stroke but also their family and other caregivers. How can this condition affect me? A stroke is a medical emergency and should be treated right away. A stroke can lead to brain damage and can sometimes be life-threatening. If a person gets medical treatment right away, there is a better chance of surviving and recovering from a stroke. What can increase my risk? The following medical conditions may increase your risk of a stroke: Cardiovascular disease. High blood pressure (hypertension). Diabetes. High cholesterol. Sickle cell disease. Blood clotting disorders (hypercoagulable state). Obesity. Sleep disorders (obstructive sleep  apnea). Other risk factors include: Being older than age 70. Having a history of blood clots, stroke, or mini-stroke (transient ischemic attack, TIA). Genetic factors, such as race, ethnicity, or a family history of stroke. Smoking cigarettes or using other tobacco products. Taking birth control pills, especially if you also use tobacco. Heavy use of alcohol or drugs, especially cocaine and methamphetamine. Physical inactivity. What actions can I take to prevent this? Manage your health conditions High cholesterol levels. Eating a healthy diet is important for preventing high cholesterol. If cholesterol cannot be managed through diet alone, you may need to take medicines. Take any prescribed medicines to control your cholesterol as told by your health care provider. Hypertension. To reduce your risk of stroke, try to keep your blood pressure below 130/80. Eating a healthy diet and exercising regularly are important for controlling blood pressure. If these steps are not enough to manage your blood pressure, you may need to take medicines. Take any prescribed medicines to control hypertension as told by your health care provider. Ask your health care provider if you should monitor your blood pressure at home. Have your blood pressure checked every year, even if your blood pressure is normal. Blood pressure increases with age and some medical conditions. Diabetes. Eating a healthy diet and exercising regularly are important parts of managing your blood sugar (glucose). If your blood sugar cannot be managed through diet and exercise, you may need to take medicines. Take any prescribed medicines to control your diabetes as told by your health care provider. Get evaluated for obstructive sleep apnea. Talk to your health care provider about getting a sleep evaluation if you snore a lot or have excessive sleepiness. Make sure that any other medical conditions you have, such as atrial fibrillation or  atherosclerosis, are managed. Nutrition Follow instructions from your health care provider about what to eat or drink to help manage your health condition. These instructions may include: Reducing your daily calorie intake. Limiting how much salt (sodium) you use to 1,500 milligrams (mg) each day. Using only healthy fats for cooking, such as olive oil, canola oil, or sunflower oil. Eating healthy foods. You can do this by: Choosing foods that are high in fiber, such as whole grains, and fresh fruits and vegetables. Eating at least 5 servings of fruits and vegetables a day. Try to fill one-half of your plate with fruits and vegetables at each meal. Choosing lean protein foods, such as lean cuts of meat, poultry without skin, fish, tofu, beans, and nuts. Eating low-fat dairy products. Avoiding foods that are high in sodium. This can help lower blood pressure. Avoiding foods that have saturated fat, trans fat, and cholesterol. This can help prevent high cholesterol. Avoiding processed and prepared foods. Counting your daily carbohydrate intake.  Lifestyle If you drink alcohol: Limit how much you have to: 0-1 drink a day for women who are not pregnant. 0-2 drinks a day for men. Know how much alcohol is in your drink. In the [place] one drink equals one 12 oz bottle of beer ( ), one 5 oz glass of wine ( ), or one 1 oz glass of hard liquor (44mL). Do not use any products that contain nicotine or tobacco. These products include cigarettes, chewing tobacco, and vaping devices, such as e-cigarettes. If you need help quitting, ask your health care provider. Avoid secondhand smoke. Do not use drugs. Activity  Try to stay at a healthy weight. Get at least 30 minutes of exercise on most days, such as: Fast walking. Biking. Swimming. Medicines Take over-the-counter and prescription medicines only as told by your health care provider. Aspirin  or blood thinners (antiplatelets or  anticoagulants) may be recommended to reduce your risk of forming blood clots that can lead to stroke. Avoid taking birth control pills. Talk to your health care provider about the risks of taking birth control pills if: You are over 50 years old. You smoke. You get very bad headaches. You have had a blood clot. Where to find more information American Stroke Association: www.strokeassociation.org Get help right away if: You or a loved one has any symptoms of a stroke. BE FAST is an easy way to remember the main warning signs of a stroke: B - Balance. Signs are dizziness, sudden trouble walking, or loss of balance. E - Eyes. Signs are trouble seeing or a sudden change in vision. F - Face. Signs are sudden weakness or numbness of the face, or the face or eyelid drooping on one side. A - Arms. Signs are weakness or numbness in an arm. This happens suddenly and usually on one side of the body. S - Speech. Signs are sudden trouble speaking, slurred speech, or trouble understanding what people say. T - Time. Time to call emergency services. Write down what time symptoms started. You or a loved one has other signs of a stroke, such as: A sudden, severe headache with no known cause. Nausea or vomiting. Seizure. These symptoms may represent a serious problem that is an emergency. Do not wait to see if the symptoms will go away. Get medical help right away. Call your local emergency services (911 in the [place] Do not drive yourself to the hospital. Summary You can help to prevent a stroke by eating healthy, exercising, not smoking, limiting alcohol intake,  and managing any medical conditions you may have. Do not use any products that contain nicotine or tobacco. These include cigarettes, chewing tobacco, and vaping devices, such as e-cigarettes. If you need help quitting, ask your health care provider. Remember BE FAST for warning signs of a stroke. Get help right away if you or a loved one has any  of these signs. This information is not intended to replace advice given to you by your health care provider. Make sure you discuss any questions you have with your health care provider. Document Revised: 02/14/2022 Document Reviewed: 02/14/2022 Elsevier Patient Education  2024 [employer].
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
## 2023-10-31 ENCOUNTER — Encounter (HOSPITAL_COMMUNITY): Payer: Self-pay | Admitting: Radiology

## 2023-10-31 NOTE — Progress Notes (Signed)
 Dr. Rosemarie ordered a cerebral angiogram for evaluation of patient's stenoses. Had Dr. Lester review the order and patient information. Per Dr. Lester the angiogram is cancelled as the patient has no symptoms. Per Dr. Lester, Dr. Rosemarie is aware. I will cancel the angiogram order. JM

## 2023-11-02 ENCOUNTER — Telehealth: Payer: Self-pay | Admitting: Neurology

## 2023-11-02 NOTE — Telephone Encounter (Signed)
 Spoke w/Pt wife regarding message. Wife stated she was just unsure about why the test was cancelled as they received a call from the scheduler. Informed Pt wife the scheduler is notified first then we are notified. Also informed her that Dr. Rosemarie is aware and we are waiting to see if he has further recommendations. Wife voiced understanding and thanks for the call back.

## 2023-11-02 NOTE — Telephone Encounter (Signed)
 The patient's wife left me a voice mail asking why his test was cancelled. Looks like Jerry English at [facility] wrote: Dr. Rosemarie ordered a cerebral angiogram for evaluation of patient's stenoses. Had Dr. Lester review the order and patient information. Per Dr. Lester the angiogram is cancelled as the patient has no symptoms. Per Dr. Lester, Dr. Rosemarie is aware. I will cancel the angiogram order.  I don't see anything from our office about this. Please call his wife back to discuss.

## 2023-11-07 ENCOUNTER — Inpatient Hospital Stay: Attending: Radiation Oncology

## 2023-11-07 DIAGNOSIS — C61 Malignant neoplasm of prostate: Secondary | ICD-10-CM | POA: Insufficient documentation

## 2023-11-07 LAB — PSA: Prostatic Specific Antigen: 0.01 ng/mL (ref 0.00–4.00)

## 2023-11-10 DIAGNOSIS — I480 Paroxysmal atrial fibrillation: Secondary | ICD-10-CM | POA: Diagnosis not present

## 2023-11-10 DIAGNOSIS — Z7901 Long term (current) use of anticoagulants: Secondary | ICD-10-CM | POA: Diagnosis not present

## 2023-11-10 DIAGNOSIS — Z5181 Encounter for therapeutic drug level monitoring: Secondary | ICD-10-CM | POA: Diagnosis not present

## 2023-11-15 ENCOUNTER — Ambulatory Visit
Admission: RE | Admit: 2023-11-15 | Discharge: 2023-11-15 | Disposition: A | Discharge status: Home / Self Care | Source: Ambulatory Visit | Attending: Radiation Oncology | Admitting: Radiation Oncology

## 2023-11-15 ENCOUNTER — Encounter: Payer: Self-pay | Admitting: Radiation Oncology

## 2023-11-15 VITALS — BP 121/76 | HR 64 | Temp 96.9°F | Resp 15 | Ht 73.0 in | Wt 207.4 lb

## 2023-11-15 DIAGNOSIS — C61 Malignant neoplasm of prostate: Secondary | ICD-10-CM | POA: Insufficient documentation

## 2023-11-15 DIAGNOSIS — Z191 Hormone sensitive malignancy status: Secondary | ICD-10-CM | POA: Diagnosis not present

## 2023-11-15 DIAGNOSIS — Z923 Personal history of irradiation: Secondary | ICD-10-CM | POA: Diagnosis not present

## 2023-11-15 NOTE — Progress Notes (Signed)
 Radiation Oncology Follow up Note  Name: Jerry English   Date:   11/15/2023 MRN:  [id number] DOB: Jul 31, 1940    This 83 y.o. male presents to the clinic today for 26-month follow-up status post image guided IMRT radiation therapy for stage IIc (cT1 cN0 M0) Gleason 8 (4+4) adenocarcinoma prostate presenting with a PSA of 9.3.  REFERRING PROVIDER: Epifanio Alm SQUIBB, MD  HPI: Patient is an 83 year old male now out 4 months having completed radiation therapy to his prostate for Gleason 8 adenocarcinoma presenting with a PSA of 9.3.  Seen today in routine follow-up he is doing well.  He specifically denies any increased lower urinary tract symptoms diarrhea or fatigue.  His most recent PSA is less than 0.01 showing excellent biochemical control of his prostate cancer..  COMPLICATIONS OF TREATMENT: none  FOLLOW UP COMPLIANCE: keeps appointments   PHYSICAL EXAM:  BP 121/76   Pulse 64   Temp (!) 96.9 F (36.1 C) (Tympanic)   Resp 15   Ht 6' 1 (1.854 m)   Wt 207 lb 6.4 oz (94.1 kg)   BMI 27.36 kg/m  Well-developed well-nourished patient in NAD. HEENT reveals PERLA, EOMI, discs not visualized.  Oral cavity is clear. No oral mucosal lesions are identified. Neck is clear without evidence of cervical or supraclavicular adenopathy. Lungs are clear to A&P. Cardiac examination is essentially unremarkable with regular rate and rhythm without murmur rub or thrill. Abdomen is benign with no organomegaly or masses noted. Motor sensory and DTR levels are equal and symmetric in the upper and lower extremities. Cranial nerves II through XII are grossly intact. Proprioception is intact. No peripheral adenopathy or edema is identified. No motor or sensory levels are noted. Crude visual fields are within normal range.  RADIOLOGY RESULTS: No current films for review  PLAN: Present time patient is under excellent biochemical control of his prostate cancer status post image guided IMRT radiation therapy on  pleased with his overall progress and low side effect profile.  I have asked to see him back in 6 months for follow-up with repeat PSA.  Patient knows to call with any concerns.  I would like to take this opportunity to thank you for allowing me to participate in the care of your patient.SABRA Marcey Penton, MD

## 2023-11-28 DIAGNOSIS — H401122 Primary open-angle glaucoma, left eye, moderate stage: Secondary | ICD-10-CM | POA: Diagnosis not present

## 2023-11-28 DIAGNOSIS — H401112 Primary open-angle glaucoma, right eye, moderate stage: Secondary | ICD-10-CM | POA: Diagnosis not present

## 2023-12-05 DIAGNOSIS — I420 Dilated cardiomyopathy: Secondary | ICD-10-CM | POA: Diagnosis not present

## 2023-12-05 DIAGNOSIS — H43813 Vitreous degeneration, bilateral: Secondary | ICD-10-CM | POA: Diagnosis not present

## 2023-12-05 DIAGNOSIS — H401122 Primary open-angle glaucoma, left eye, moderate stage: Secondary | ICD-10-CM | POA: Diagnosis not present

## 2023-12-05 DIAGNOSIS — H401112 Primary open-angle glaucoma, right eye, moderate stage: Secondary | ICD-10-CM | POA: Diagnosis not present

## 2023-12-05 DIAGNOSIS — H353131 Nonexudative age-related macular degeneration, bilateral, early dry stage: Secondary | ICD-10-CM | POA: Diagnosis not present

## 2023-12-20 DIAGNOSIS — Z7901 Long term (current) use of anticoagulants: Secondary | ICD-10-CM | POA: Diagnosis not present

## 2023-12-20 DIAGNOSIS — Z5181 Encounter for therapeutic drug level monitoring: Secondary | ICD-10-CM | POA: Diagnosis not present

## 2023-12-20 DIAGNOSIS — I480 Paroxysmal atrial fibrillation: Secondary | ICD-10-CM | POA: Diagnosis not present

## 2024-01-03 DIAGNOSIS — I2510 Atherosclerotic heart disease of native coronary artery without angina pectoris: Secondary | ICD-10-CM | POA: Diagnosis not present

## 2024-01-03 DIAGNOSIS — N1831 Chronic kidney disease, stage 3a: Secondary | ICD-10-CM | POA: Diagnosis not present

## 2024-01-03 DIAGNOSIS — E785 Hyperlipidemia, unspecified: Secondary | ICD-10-CM | POA: Diagnosis not present

## 2024-01-03 DIAGNOSIS — I10 Essential (primary) hypertension: Secondary | ICD-10-CM | POA: Diagnosis not present

## 2024-01-03 DIAGNOSIS — D649 Anemia, unspecified: Secondary | ICD-10-CM | POA: Diagnosis not present

## 2024-01-03 DIAGNOSIS — Z23 Encounter for immunization: Secondary | ICD-10-CM | POA: Diagnosis not present

## 2024-01-03 DIAGNOSIS — E039 Hypothyroidism, unspecified: Secondary | ICD-10-CM | POA: Diagnosis not present

## 2024-01-03 DIAGNOSIS — Z79899 Other long term (current) drug therapy: Secondary | ICD-10-CM | POA: Diagnosis not present

## 2024-01-03 DIAGNOSIS — Z7901 Long term (current) use of anticoagulants: Secondary | ICD-10-CM | POA: Diagnosis not present

## 2024-01-03 DIAGNOSIS — I480 Paroxysmal atrial fibrillation: Secondary | ICD-10-CM | POA: Diagnosis not present

## 2024-01-03 DIAGNOSIS — Z5181 Encounter for therapeutic drug level monitoring: Secondary | ICD-10-CM | POA: Diagnosis not present

## 2024-01-22 DIAGNOSIS — Z5181 Encounter for therapeutic drug level monitoring: Secondary | ICD-10-CM | POA: Diagnosis not present

## 2024-01-22 DIAGNOSIS — Z7901 Long term (current) use of anticoagulants: Secondary | ICD-10-CM | POA: Diagnosis not present

## 2024-01-22 DIAGNOSIS — I480 Paroxysmal atrial fibrillation: Secondary | ICD-10-CM | POA: Diagnosis not present

## 2024-03-04 DIAGNOSIS — I252 Old myocardial infarction: Secondary | ICD-10-CM | POA: Diagnosis not present

## 2024-03-04 DIAGNOSIS — E785 Hyperlipidemia, unspecified: Secondary | ICD-10-CM | POA: Diagnosis not present

## 2024-03-04 DIAGNOSIS — R0989 Other specified symptoms and signs involving the circulatory and respiratory systems: Secondary | ICD-10-CM | POA: Diagnosis not present

## 2024-03-04 DIAGNOSIS — I2510 Atherosclerotic heart disease of native coronary artery without angina pectoris: Secondary | ICD-10-CM | POA: Diagnosis not present

## 2024-03-04 DIAGNOSIS — R0602 Shortness of breath: Secondary | ICD-10-CM | POA: Diagnosis not present

## 2024-03-04 DIAGNOSIS — I480 Paroxysmal atrial fibrillation: Secondary | ICD-10-CM | POA: Diagnosis not present

## 2024-03-04 DIAGNOSIS — I10 Essential (primary) hypertension: Secondary | ICD-10-CM | POA: Diagnosis not present

## 2024-03-04 DIAGNOSIS — Z95810 Presence of automatic (implantable) cardiac defibrillator: Secondary | ICD-10-CM | POA: Diagnosis not present

## 2024-03-04 DIAGNOSIS — I420 Dilated cardiomyopathy: Secondary | ICD-10-CM | POA: Diagnosis not present

## 2024-03-04 DIAGNOSIS — N1831 Chronic kidney disease, stage 3a: Secondary | ICD-10-CM | POA: Diagnosis not present

## 2024-04-17 ENCOUNTER — Other Ambulatory Visit: Payer: Self-pay | Admitting: Internal Medicine

## 2024-04-17 DIAGNOSIS — I2510 Atherosclerotic heart disease of native coronary artery without angina pectoris: Secondary | ICD-10-CM

## 2024-05-08 ENCOUNTER — Other Ambulatory Visit

## 2024-05-15 ENCOUNTER — Ambulatory Visit: Admitting: Radiation Oncology

## 2024-05-21 ENCOUNTER — Other Ambulatory Visit (HOSPITAL_COMMUNITY)

## 2024-09-17 ENCOUNTER — Ambulatory Visit: Admitting: Neurology
